# Patient Record
Sex: Female | Born: 1935 | Race: Black or African American | Hispanic: No | Smoking: Never smoker
Health system: Southern US, Community
[De-identification: ages and names within clinical notes are randomized; demographics above are authoritative.]

## PROBLEM LIST (undated history)

## (undated) DIAGNOSIS — M1990 Unspecified osteoarthritis, unspecified site: Secondary | ICD-10-CM

## (undated) DIAGNOSIS — E785 Hyperlipidemia, unspecified: Secondary | ICD-10-CM

## (undated) DIAGNOSIS — I2510 Atherosclerotic heart disease of native coronary artery without angina pectoris: Secondary | ICD-10-CM

## (undated) DIAGNOSIS — I252 Old myocardial infarction: Secondary | ICD-10-CM

## (undated) DIAGNOSIS — D649 Anemia, unspecified: Secondary | ICD-10-CM

## (undated) DIAGNOSIS — N181 Chronic kidney disease, stage 1: Secondary | ICD-10-CM

## (undated) DIAGNOSIS — R209 Unspecified disturbances of skin sensation: Secondary | ICD-10-CM

## (undated) DIAGNOSIS — K219 Gastro-esophageal reflux disease without esophagitis: Secondary | ICD-10-CM

## (undated) DIAGNOSIS — I10 Essential (primary) hypertension: Secondary | ICD-10-CM

## (undated) DIAGNOSIS — K222 Esophageal obstruction: Secondary | ICD-10-CM

## (undated) HISTORY — DX: Unspecified disturbances of skin sensation: R20.9

## (undated) HISTORY — DX: Esophageal obstruction: K22.2

## (undated) HISTORY — DX: Anemia, unspecified: D64.9

## (undated) HISTORY — DX: Atherosclerotic heart disease of native coronary artery without angina pectoris: I25.10

## (undated) HISTORY — PX: ABDOMINAL HYSTERECTOMY: SHX81

## (undated) HISTORY — DX: Old myocardial infarction: I25.2

## (undated) HISTORY — PX: REVISION TOTAL KNEE ARTHROPLASTY: SHX767

## (undated) HISTORY — DX: Unspecified osteoarthritis, unspecified site: M19.90

## (undated) HISTORY — DX: Hyperlipidemia, unspecified: E78.5

## (undated) HISTORY — DX: Chronic kidney disease, stage 1: N18.1

## (undated) HISTORY — PX: TONSILLECTOMY: SUR1361

## (undated) HISTORY — DX: Essential (primary) hypertension: I10

## (undated) HISTORY — DX: Gastro-esophageal reflux disease without esophagitis: K21.9

---

## 1998-03-08 ENCOUNTER — Ambulatory Visit (HOSPITAL_COMMUNITY): Admission: RE | Admit: 1998-03-08 | Discharge: 1998-03-08 | Payer: Self-pay | Admitting: Gastroenterology

## 1999-05-10 ENCOUNTER — Encounter: Admission: RE | Admit: 1999-05-10 | Discharge: 1999-05-10 | Payer: Self-pay | Admitting: Internal Medicine

## 1999-05-10 ENCOUNTER — Encounter: Payer: Self-pay | Admitting: Internal Medicine

## 1999-07-05 ENCOUNTER — Other Ambulatory Visit: Admission: RE | Admit: 1999-07-05 | Discharge: 1999-07-05 | Payer: Self-pay | Admitting: Internal Medicine

## 2000-09-22 ENCOUNTER — Encounter: Payer: Self-pay | Admitting: Ophthalmology

## 2000-09-25 ENCOUNTER — Ambulatory Visit (HOSPITAL_COMMUNITY): Admission: RE | Admit: 2000-09-25 | Discharge: 2000-09-25 | Payer: Self-pay | Admitting: Ophthalmology

## 2001-05-13 ENCOUNTER — Encounter: Payer: Self-pay | Admitting: Gastroenterology

## 2001-05-13 ENCOUNTER — Ambulatory Visit (HOSPITAL_COMMUNITY): Admission: RE | Admit: 2001-05-13 | Discharge: 2001-05-13 | Payer: Self-pay | Admitting: Gastroenterology

## 2001-06-02 ENCOUNTER — Encounter: Admission: RE | Admit: 2001-06-02 | Discharge: 2001-06-02 | Payer: Self-pay | Admitting: Internal Medicine

## 2001-06-02 ENCOUNTER — Encounter: Payer: Self-pay | Admitting: Internal Medicine

## 2001-09-04 ENCOUNTER — Inpatient Hospital Stay (HOSPITAL_COMMUNITY): Admission: RE | Admit: 2001-09-04 | Discharge: 2001-09-07 | Payer: Self-pay | Admitting: Cardiology

## 2001-09-22 ENCOUNTER — Encounter: Payer: Self-pay | Admitting: Emergency Medicine

## 2001-09-22 ENCOUNTER — Emergency Department (HOSPITAL_COMMUNITY): Admission: EM | Admit: 2001-09-22 | Discharge: 2001-09-22 | Payer: Self-pay | Admitting: Emergency Medicine

## 2002-07-28 ENCOUNTER — Encounter: Payer: Self-pay | Admitting: Internal Medicine

## 2002-07-28 ENCOUNTER — Encounter: Admission: RE | Admit: 2002-07-28 | Discharge: 2002-07-28 | Payer: Self-pay | Admitting: Internal Medicine

## 2003-09-06 ENCOUNTER — Encounter: Admission: RE | Admit: 2003-09-06 | Discharge: 2003-09-06 | Payer: Self-pay | Admitting: Internal Medicine

## 2005-12-20 ENCOUNTER — Ambulatory Visit (HOSPITAL_COMMUNITY): Admission: RE | Admit: 2005-12-20 | Discharge: 2005-12-20 | Payer: Self-pay | Admitting: Internal Medicine

## 2010-02-19 ENCOUNTER — Emergency Department (HOSPITAL_COMMUNITY): Admission: EM | Admit: 2010-02-19 | Discharge: 2010-02-19 | Payer: Self-pay | Admitting: Family Medicine

## 2010-03-01 ENCOUNTER — Ambulatory Visit: Payer: Self-pay | Admitting: Internal Medicine

## 2010-03-01 DIAGNOSIS — I2510 Atherosclerotic heart disease of native coronary artery without angina pectoris: Secondary | ICD-10-CM

## 2010-03-01 DIAGNOSIS — E785 Hyperlipidemia, unspecified: Secondary | ICD-10-CM

## 2010-03-01 DIAGNOSIS — R209 Unspecified disturbances of skin sensation: Secondary | ICD-10-CM

## 2010-03-01 DIAGNOSIS — M1990 Unspecified osteoarthritis, unspecified site: Secondary | ICD-10-CM | POA: Insufficient documentation

## 2010-03-01 DIAGNOSIS — I10 Essential (primary) hypertension: Secondary | ICD-10-CM | POA: Insufficient documentation

## 2010-03-01 DIAGNOSIS — I252 Old myocardial infarction: Secondary | ICD-10-CM | POA: Insufficient documentation

## 2010-03-01 LAB — CONVERTED CEMR LAB
ALT: 12 units/L (ref 0–35)
Albumin: 3.5 g/dL (ref 3.5–5.2)
BUN: 23 mg/dL (ref 6–23)
Basophils Relative: 0.3 % (ref 0.0–3.0)
Calcium: 9.1 mg/dL (ref 8.4–10.5)
Cholesterol: 140 mg/dL (ref 0–200)
Eosinophils Absolute: 0.2 10*3/uL (ref 0.0–0.7)
Eosinophils Relative: 1.5 % (ref 0.0–5.0)
GFR calc non Af Amer: 46.54 mL/min (ref >60)
HCT: 32.1 % — ABNORMAL LOW (ref 36.0–46.0)
HDL goal, serum: 40 mg/dL
Hemoglobin: 10.7 g/dL — ABNORMAL LOW (ref 12.0–15.0)
LDL Goal: 100 mg/dL
Lymphs Abs: 1.9 10*3/uL (ref 0.7–4.0)
MCHC: 33.4 g/dL (ref 30.0–36.0)
MCV: 93.8 fL (ref 78.0–100.0)
Monocytes Absolute: 1.9 10*3/uL — ABNORMAL HIGH (ref 0.1–1.0)
Neutro Abs: 11.6 10*3/uL — ABNORMAL HIGH (ref 1.4–7.7)
Neutrophils Relative %: 74.1 % (ref 43.0–77.0)
Potassium: 4.1 meq/L (ref 3.5–5.1)
RBC: 3.42 M/uL — ABNORMAL LOW (ref 3.87–5.11)
Sed Rate: 61 mm/hr — ABNORMAL HIGH (ref 0–22)
Sodium: 138 meq/L (ref 135–145)
TSH: 2.07 microintl units/mL (ref 0.35–5.50)
Total Protein: 7 g/dL (ref 6.0–8.3)
VLDL: 14.6 mg/dL (ref 0.0–40.0)
Vitamin B-12: 294 pg/mL (ref 211–911)
WBC: 15.6 10*3/uL — ABNORMAL HIGH (ref 4.5–10.5)

## 2010-03-02 ENCOUNTER — Encounter: Payer: Self-pay | Admitting: Internal Medicine

## 2010-03-15 ENCOUNTER — Ambulatory Visit: Payer: Self-pay | Admitting: Internal Medicine

## 2010-03-15 DIAGNOSIS — D72829 Elevated white blood cell count, unspecified: Secondary | ICD-10-CM | POA: Insufficient documentation

## 2010-03-15 DIAGNOSIS — N181 Chronic kidney disease, stage 1: Secondary | ICD-10-CM

## 2010-03-15 LAB — CONVERTED CEMR LAB
Alpha-1-Globulin: 7.3 % — ABNORMAL HIGH (ref 2.9–4.9)
Basophils Absolute: 0 10*3/uL (ref 0.0–0.1)
Basophils Relative: 0.3 % (ref 0.0–3.0)
CO2: 26 meq/L (ref 19–32)
Calcium: 9.5 mg/dL (ref 8.4–10.5)
Creatinine, Ser: 1.2 mg/dL (ref 0.4–1.2)
Eosinophils Absolute: 0.3 10*3/uL (ref 0.0–0.7)
GFR calc non Af Amer: 54.1 mL/min (ref >60)
Hemoglobin: 11 g/dL — ABNORMAL LOW (ref 12.0–15.0)
Lymphocytes Relative: 16.6 % (ref 12.0–46.0)
MCHC: 33.8 g/dL (ref 30.0–36.0)
Monocytes Relative: 9.7 % (ref 3.0–12.0)
Neutro Abs: 7.8 10*3/uL — ABNORMAL HIGH (ref 1.4–7.7)
Neutrophils Relative %: 70.3 % (ref 43.0–77.0)
RBC: 3.48 M/uL — ABNORMAL LOW (ref 3.87–5.11)
Saturation Ratios: 19.1 % — ABNORMAL LOW (ref 20.0–50.0)
Sodium: 141 meq/L (ref 135–145)
Total Protein, Serum Electrophoresis: 7.7 g/dL (ref 6.0–8.3)
Transferrin: 246.7 mg/dL (ref 212.0–360.0)

## 2010-05-02 ENCOUNTER — Telehealth: Payer: Self-pay | Admitting: Internal Medicine

## 2010-06-12 ENCOUNTER — Ambulatory Visit: Payer: Self-pay | Admitting: Internal Medicine

## 2010-06-12 DIAGNOSIS — K219 Gastro-esophageal reflux disease without esophagitis: Secondary | ICD-10-CM | POA: Insufficient documentation

## 2010-06-14 ENCOUNTER — Encounter (INDEPENDENT_AMBULATORY_CARE_PROVIDER_SITE_OTHER): Payer: Self-pay | Admitting: *Deleted

## 2010-06-20 ENCOUNTER — Encounter (INDEPENDENT_AMBULATORY_CARE_PROVIDER_SITE_OTHER): Payer: Self-pay | Admitting: *Deleted

## 2010-06-20 ENCOUNTER — Ambulatory Visit: Payer: Self-pay | Admitting: Internal Medicine

## 2010-06-27 ENCOUNTER — Encounter: Payer: Self-pay | Admitting: Internal Medicine

## 2010-06-27 ENCOUNTER — Telehealth: Payer: Self-pay | Admitting: Internal Medicine

## 2010-06-27 ENCOUNTER — Ambulatory Visit (HOSPITAL_COMMUNITY)
Admission: RE | Admit: 2010-06-27 | Discharge: 2010-06-27 | Payer: Self-pay | Source: Home / Self Care | Admitting: Internal Medicine

## 2010-06-27 DIAGNOSIS — K222 Esophageal obstruction: Secondary | ICD-10-CM | POA: Insufficient documentation

## 2010-06-28 ENCOUNTER — Telehealth: Payer: Self-pay | Admitting: Internal Medicine

## 2010-07-04 ENCOUNTER — Ambulatory Visit (HOSPITAL_COMMUNITY)
Admission: RE | Admit: 2010-07-04 | Discharge: 2010-07-04 | Payer: Self-pay | Source: Home / Self Care | Admitting: Internal Medicine

## 2010-07-12 ENCOUNTER — Telehealth: Payer: Self-pay | Admitting: Internal Medicine

## 2010-07-13 ENCOUNTER — Encounter (INDEPENDENT_AMBULATORY_CARE_PROVIDER_SITE_OTHER): Payer: Self-pay | Admitting: *Deleted

## 2010-07-25 ENCOUNTER — Ambulatory Visit (HOSPITAL_COMMUNITY)
Admission: RE | Admit: 2010-07-25 | Discharge: 2010-07-25 | Payer: Self-pay | Source: Home / Self Care | Attending: Internal Medicine | Admitting: Internal Medicine

## 2010-07-25 ENCOUNTER — Encounter: Payer: Self-pay | Admitting: Internal Medicine

## 2010-08-14 NOTE — Letter (Signed)
Summary: New Patient letter  [facility]  [address] [place], [place] [address]   Phone: [phone]  Fax: [phone]       06/14/2010 MRN: [id number]  Natalie Goodman [address] [place], [place]  [address]  Dear [name],  Welcome to the Gastroenterology Division at [facility].    You are scheduled to see Dr.  Leone Payor on 06-20-10 at 2:45p.m. on the 3rd floor at [facility], [address]  We ask that you try to arrive at our office 15 minutes prior to your appointment time to allow for check-in.  We would like you to complete the enclosed self-administered evaluation form prior to your visit and bring it with you on the day of your appointment.  We will review it with you.  Also, please bring a complete list of all your medications or, if you prefer, bring the medication bottles and we will list them.  Please bring your insurance card so that we may make a copy of it.  If your insurance requires a referral to see a specialist, please bring your referral form from your primary care physician.  Co-payments are due at the time of your visit and may be paid by cash, check or credit card.     Your office visit will consist of a consult with your physician (includes a physical exam), any laboratory testing he/she may order, scheduling of any necessary diagnostic testing (e.g. x-ray, ultrasound, CT-scan), and scheduling of a procedure (e.g. Endoscopy, Colonoscopy) if required.  Please allow enough time on your schedule to allow for any/all of these possibilities.    If you cannot keep your appointment, please call [phone] to cancel or reschedule prior to your appointment date.  This allows [place] the opportunity to schedule an appointment for another patient in need of care.  If you do not cancel or reschedule by 5 p.m. the business day prior to your appointment date, you will be charged a $50.00 late cancellation/no-show fee.    Thank you for choosing   Gastroenterology for your medical needs.  We appreciate the opportunity to care for you.  Please visit [place] at our website  to learn more about our practice.                     Sincerely,                                                             The Gastroenterology Division

## 2010-08-14 NOTE — Letter (Signed)
Summary: Results Follow-up Letter  [facility]  [address] [place], [place] [address]   Phone: [phone]  Fax: [phone]    03/02/2010  [address] [place], [place]  [address]  Dear Ms. Bellantoni,   The following are the results of your recent test(s):  Test     Result     CBC       WBC is slightly elevated and you are anemic Liver       normal Kidney     mild dysfunction B12 level     normal Thyroid     normal  _________________________________________________________  Please call for an appointment in 2-3 weeks _________________________________________________________ _________________________________________________________ _________________________________________________________  Sincerely,  Sanda Linger MD [facility]

## 2010-08-14 NOTE — Progress Notes (Signed)
Summary: pt refuse appt   Phone Note From Other Clinic   Caller: Appointment Secretary- sara [phone] [facility]  Call For: Dr Yetta Barre Summary of Call: [facility] sara  called pt to set up appt  for a screening  mamogram was  told by patient that she did not want to have any more mammograms . sara from [facility] wanted to inform Dr Yetta Barre of this and wanted to know what to do about her referral pls advise .Marland Kitchenreferral was done in august  Initial call taken by: Shelbie Proctor,  May 02, 2010 8:27 AM

## 2010-08-14 NOTE — Assessment & Plan Note (Signed)
Summary: TROUBLE SWALLOWING FOOD--VOMIT--STC   Vital Signs:  Patient profile:   77 year old female Menstrual status:  postmenopausal Height:      55 inches Weight:      235.50 pounds BMI:     54.93 O2 Sat:      97 % on Room air Temp:     98.7 degrees F oral Pulse rate:   83 / minute Pulse rhythm:   regular Resp:     16 per minute BP sitting:   130 / 64  (left arm) Cuff size:   small  Vitals Entered By: Rock Nephew CMA (June 12, 2010 11:11 AM)  O2 Flow:  Room air CC: Patient c/o of swallowing difficulty and vomiting when eating Is Patient Diabetic? No Pain Assessment Patient in pain? no       Does patient need assistance? Functional Status Self care Ambulation Normal     Menstrual Status postmenopausal   Primary Care Dangela How:  Etta Grandchild MD  CC:  Patient c/o of swallowing difficulty and vomiting when eating.  History of Present Illness: She returns c/o difficulty swallowing liquids and solids for 2 months. She says she saw Dr. Jarold Motto years ago for same and describes getting an EGD with dilation done.  Preventive Screening-Counseling & Management  Alcohol-Tobacco     Alcohol drinks/day: 0     Alcohol Counseling: not indicated; patient does not drink     Smoking Status: never     Tobacco Counseling: not indicated; no tobacco use  Hep-HIV-STD-Contraception     Hepatitis Risk: no risk noted     HIV Risk: no risk noted     STD Risk: no risk noted     Dental Visit-last 6 months yes     SBE monthly: yes     SBE Education/Counseling: to perform regular SBE  Clinical Review Panels:  Prevention   Last Colonoscopy:  normal (07/15/1998)  Immunizations   Last Tetanus Booster:  Td (06/12/2010)   Last Flu Vaccine:  Fluvax 3+ (06/12/2010)  Lipid Management   Cholesterol:  140 (03/01/2010)   LDL (bad choesterol):  78 (03/01/2010)   HDL (good cholesterol):  47.10 (03/01/2010)  Diabetes Management   Creatinine:  1.2 (03/15/2010)   Last Flu  Vaccine:  Fluvax 3+ (06/12/2010)  CBC   WBC:  11.1 (03/15/2010)   RBC:  3.48 (03/15/2010)   Hgb:  11.0 (03/15/2010)   Hct:  32.5 (03/15/2010)   Platelets:  249.0 (03/15/2010)   MCV  93.4 (03/15/2010)   MCHC  33.8 (03/15/2010)   RDW  13.5 (03/15/2010)   PMN:  70.3 (03/15/2010)   Lymphs:  16.6 (03/15/2010)   Monos:  9.7 (03/15/2010)   Eosinophils:  3.1 (03/15/2010)   Basophil:  0.3 (03/15/2010)  Complete Metabolic Panel   Glucose:  100 (03/15/2010)   Sodium:  141 (03/15/2010)   Potassium:  4.5 (03/15/2010)   Chloride:  106 (03/15/2010)   CO2:  26 (03/15/2010)   BUN:  25 (03/15/2010)   Creatinine:  1.2 (03/15/2010)   Albumin:  3.5 (03/01/2010)   Total Protein:  7.0 (03/01/2010)   Calcium:  9.5 (03/15/2010)   Total Bili:  2.0 (03/01/2010)   Alk Phos:  81 (03/01/2010)   SGPT (ALT):  12 (03/01/2010)   SGOT (AST):  18 (03/01/2010)   Medications Prior to Update: 1)  Furosemide 20 Mg Tabs (Furosemide) .... Take 1 Tablet By Mouth Once A Day 2)  Diovan 160 Mg Tabs (Valsartan) .... Take 1 Tablet By Mouth Once  A Day 3)  Metoprolol Tartrate 50 Mg Tabs (Metoprolol Tartrate) .... Take 1 Tablet By Mouth Two Times A Day 4)  Crestor 5 Mg Tabs (Rosuvastatin Calcium) .... Take 1 Tablet By Mouth Once A Day 5)  Bayer Aspirin 325 Mg Tabs (Aspirin) .... Take 1 Tablet By Mouth Once A Day  Current Medications (verified): 1)  Furosemide 20 Mg Tabs (Furosemide) .... Take 1 Tablet By Mouth Once A Day 2)  Diovan 160 Mg Tabs (Valsartan) .... Take 1 Tablet By Mouth Once A Day 3)  Metoprolol Tartrate 50 Mg Tabs (Metoprolol Tartrate) .... Take 1 Tablet By Mouth Two Times A Day 4)  Crestor 5 Mg Tabs (Rosuvastatin Calcium) .... Take 1 Tablet By Mouth Once A Day 5)  Bayer Aspirin 325 Mg Tabs (Aspirin) .... Take 1 Tablet By Mouth Once A Day 6)  Omeprazole 40 Mg Cpdr (Omeprazole) .... One By Mouth Once Daily For Heartburn  Allergies (verified): 1)  ! Codeine  Past History:  Past Medical History: Last  updated: 03/01/2010 Coronary artery disease Hyperlipidemia Hypertension Myocardial infarction, hx of Osteoarthritis  Past Surgical History: Last updated: 03/01/2010 Hysterectomy Total knee replacement Tonsillectomy  Family History: Last updated: 03/01/2010 Family History of Arthritis  Social History: Last updated: 03/01/2010 Retired Widow/Widower Never Smoked Alcohol use-no Drug use-no Regular exercise-no  Risk Factors: Alcohol Use: 0 (06/12/2010) Exercise: no (03/01/2010)  Risk Factors: Smoking Status: never (06/12/2010)  Family History: Reviewed history from 03/01/2010 and no changes required. Family History of Arthritis  Social History: Reviewed history from 03/01/2010 and no changes required. Retired Conservation officer, nature Never Smoked Alcohol use-no Drug use-no Regular exercise-no  Review of Systems  The patient denies anorexia, fever, weight loss, weight gain, hoarseness, chest pain, syncope, peripheral edema, prolonged cough, headaches, hemoptysis, abdominal pain, melena, hematochezia, severe indigestion/heartburn, and hematuria.   GI:  Denies abdominal pain, bloody stools, change in bowel habits, diarrhea, gas, indigestion, loss of appetite, nausea, vomiting, vomiting blood, and yellowish skin color.  Physical Exam  General:  alert, well-developed, well-nourished, well-hydrated, appropriate dress, normal appearance, healthy-appearing, cooperative to examination, and good hygiene.   Mouth:  edentulous.   Neck:  supple, full ROM, no masses, no thyromegaly, no JVD, normal carotid upstroke, no carotid bruits, and no cervical lymphadenopathy.   Lungs:  normal respiratory effort, no intercostal retractions, no accessory muscle use, normal breath sounds, no dullness, no fremitus, no crackles, and no wheezes.   Heart:  normal rate, regular rhythm, no murmur, no gallop, no rub, and no JVD.   Abdomen:  soft, non-tender, normal bowel sounds, no distention, no masses, no  guarding, no rigidity, no rebound tenderness, no abdominal hernia, no inguinal hernia, no hepatomegaly, and no splenomegaly.   Msk:  normal ROM, no joint tenderness, no joint swelling, no joint warmth, no redness over joints, no joint deformities, no joint instability, and no crepitation.   Extremities:  No clubbing, cyanosis, edema, or deformity noted with normal full range of motion of all joints.   Neurologic:  alert & oriented X3, cranial nerves II-XII intact, strength normal in all extremities, sensation intact to light touch, gait normal, toes down bilaterally on Babinski, Romberg negative, RUE hyporeflexia, RLE hyporeflexia, LUE hyporeflexia, and LLE hyporeflexia.   Skin:  turgor normal, color normal, no rashes, no suspicious lesions, no ecchymoses, no petechiae, no purpura, no ulcerations, and no edema.   Cervical Nodes:  no anterior cervical adenopathy and no posterior cervical adenopathy.   Psych:  Cognition and judgment appear intact. Alert and cooperative with  normal attention span and concentration. No apparent delusions, illusions, hallucinations   Impression & Recommendations:  Problem # 1:  DYSPHAGIA PHARYNGOESOPHAGEAL PHASE (EAV-409.81) Assessment New  Orders: Gastroenterology Referral (GI)  Problem # 2:  GERD (ICD-530.81) Assessment: New  Her updated medication list for this problem includes:    Omeprazole 40 Mg Cpdr (Omeprazole) ..... One by mouth once daily for heartburn  Labs Reviewed: Hgb: 11.0 (03/15/2010)   Hct: 32.5 (03/15/2010)  Complete Medication List: 1)  Furosemide 20 Mg Tabs (Furosemide) .... Take 1 tablet by mouth once a day 2)  Diovan 160 Mg Tabs (Valsartan) .... Take 1 tablet by mouth once a day 3)  Metoprolol Tartrate 50 Mg Tabs (Metoprolol tartrate) .... Take 1 tablet by mouth two times a day 4)  Crestor 5 Mg Tabs (Rosuvastatin calcium) .... Take 1 tablet by mouth once a day 5)  Bayer Aspirin 325 Mg Tabs (Aspirin) .... Take 1 tablet by mouth once a  day 6)  Omeprazole 40 Mg Cpdr (Omeprazole) .... One by mouth once daily for heartburn  Other Orders: Flu Vaccine 28yrs + MEDICARE PATIENTS (X9147) Administration Flu vaccine - MCR (G0008) TD Toxoids IM 7 YR + (82956) Admin 1st Vaccine (21308)  Patient Instructions: 1)  Please schedule a follow-up appointment in 1 month. 2)  Avoid foods high in acid (tomatoes, citrus juices, spicy foods). Avoid eating within two hours of lying down or before exercising. Do not over eat; try smaller more frequent meals. Elevate head of bed twelve inches when sleeping. Prescriptions: OMEPRAZOLE 40 MG CPDR (OMEPRAZOLE) one by mouth once daily for heartburn  #30 x 11   Entered and Authorized by:   Etta Grandchild MD   Signed by:   Etta Grandchild MD on 06/12/2010   Method used:   Electronically to        [facility] [id number]* (retail)       [address]       [place], [place]  [address]       Ph: [phone]       Fax: [phone]   RxID:   [id number]    Orders Added: 1)  Gastroenterology Referral [GI] 2)  Flu Vaccine 60yrs + MEDICARE PATIENTS [Q2039] 3)  Administration Flu vaccine - MCR [G0008] 4)  TD Toxoids IM 7 YR + [90714] 5)  Admin 1st Vaccine [90471] 6)  Est. Patient Level IV [64332]   Immunizations Administered:  Tetanus Vaccine:    Vaccine Type: Td    Site: right deltoid    Mfr: Sanofi Pasteur    Dose: 0.5 ml    Route: IM    Given by: Rock Nephew CMA    Exp. Date: 08/16/2011    Lot #: [id number]    VIS given: 06/01/08 version given June 12, 2010.   Immunizations Administered:  Tetanus Vaccine:    Vaccine Type: Td    Site: right deltoid    Mfr: Sanofi Pasteur    Dose: 0.5 ml    Route: IM    Given by: Rock Nephew CMA    Exp. Date: 08/16/2011    Lot #: [id number]    VIS given: 06/01/08 version given June 12, 2010. Marland [email or web address] Flu Vaccine Consent Questions     Do you have a history of severe allergic reactions to this vaccine? no    Any prior history of  allergic reactions to egg and/or gelatin? no    Do you have a sensitivity to the preservative Thimersol? no  Do you have a past history of Guillan-Barre Syndrome? no    Do you currently have an acute febrile illness? no    Have you ever had a severe reaction to latex? no    Vaccine information given and explained to patient? yes    Are you currently pregnant? no    Lot Number:[id number]   Exp Date:01/12/2011   Site Given  Left Deltoid IM

## 2010-08-14 NOTE — Letter (Signed)
Summary: EGD Instructions  [facility]  [address] [place], [place] [address]   Phone: [phone]  Fax: [phone]       Javen Muntean    Oct 31, 1924    MRN: [id number]       Procedure Day Dorna BloomLulu Riding, 06/27/10     Arrival Time: 12:00 PM      Procedure Time: 1:15 PM    Location of Procedure:                    _X_ [facility] ( Outpatient Registration)   PREPARATION FOR ENDOSCOPY   On [facility], 06/27/10,  THE DAY OF THE PROCEDURE:  1.   No solid foods, milk or milk products are allowed after midnight the night before your procedure.  2.   Do not drink anything colored red or purple.  Avoid juices with pulp.  No orange juice.  3.  You may drink clear liquids until 9:15 AM, which is 4 hours before your procedure.                                                                                                CLEAR LIQUIDS INCLUDE: Water Jello Ice Popsicles Tea (sugar ok, no milk/cream) Powdered fruit flavored drinks Coffee (sugar ok, no milk/cream) Gatorade Juice: apple, white grape, white cranberry  Lemonade Clear bullion, consomm, broth Carbonated beverages (any kind) Strained chicken noodle soup Hard Candy   MEDICATION INSTRUCTIONS  Unless otherwise instructed, you should take regular prescription medications with a small sip of water as early as possible the morning of your procedure.                  OTHER INSTRUCTIONS  You will need a responsible adult at least 75 years of age to accompany you and drive you home.   This person must remain in the waiting room during your procedure.  Wear loose fitting clothing that is easily removed.  Leave jewelry and other valuables at home.  However, you may wish to bring a book to read or an iPod/MP3 player to listen to music as you wait for your procedure to start.  Remove all body piercing jewelry and leave at home.  Total time from sign-in until discharge is approximately 2-3  hours.  You should go home directly after your procedure and rest.  You can resume normal activities the day after your procedure.  The day of your procedure you should not:   Drive   Make legal decisions   Operate machinery   Drink alcohol   Return to work  You will receive specific instructions about eating, activities and medications before you leave.    The above instructions have been reviewed and explained to me by   _______________________    I fully understand and can verbalize these instructions _____________________________ Date _________

## 2010-08-14 NOTE — Letter (Signed)
Summary: Lipid Letter  [facility]  [address] [place], [place] [address]   Phone: [phone]  Fax: [phone]    03/02/2010  [facility] [address] [place], [place]  [address]  Dear Ms. Cuthbert:  We have carefully reviewed your last lipid profile from 03/01/2010 and the results are noted below with a summary of recommendations for lipid management.    Cholesterol:       140     Goal: <200   HDL "good" Cholesterol:   57.84     Goal: >40   LDL "bad" Cholesterol:   78     Goal: <100   Triglycerides:       73.0     Goal: <150        TLC Diet (Therapeutic Lifestyle Change): Saturated Fats & Transfatty acids should be kept < 7% of total calories ***Reduce Saturated Fats Polyunstaurated Fat can be up to 10% of total calories Monounsaturated Fat Fat can be up to 20% of total calories Total Fat should be no greater than 25-35% of total calories Carbohydrates should be 50-60% of total calories Protein should be approximately 15% of total calories Fiber should be at least 20-30 grams a day ***Increased fiber may help lower LDL Total Cholesterol should be < 200mg /day Consider adding plant stanol/sterols to diet (example: Benacol spread) ***A higher intake of unsaturated fat may reduce Triglycerides and Increase HDL    Adjunctive Measures (may lower LIPIDS and reduce risk of Heart Attack) include: Aerobic Exercise (20-30 minutes 3-4 times a week) Limit Alcohol Consumption Weight Reduction Aspirin 75-81 mg a day by mouth (if not allergic or contraindicated) Dietary Fiber 20-30 grams a day by mouth     Current Medications: 1)    Furosemide 20 Mg Tabs (Furosemide) .... Take 1 tablet by mouth once a day 2)    Diovan 160 Mg Tabs (Valsartan) .... Take 1 tablet by mouth once a day 3)    Metoprolol Tartrate 50 Mg Tabs (Metoprolol tartrate) .... Take 1 tablet by mouth two times a day 4)    Crestor 5 Mg Tabs (Rosuvastatin calcium) .... Take 1 tablet by mouth once a day 5)     Bayer Aspirin 325 Mg Tabs (Aspirin) .... Take 1 tablet by mouth once a day  If you have any questions, please call. We appreciate being able to work with you.   Sincerely,    [facility] Etta Grandchild MD

## 2010-08-14 NOTE — Assessment & Plan Note (Signed)
Summary: 2 WK ROV /NWS  #   Vital Signs:  Patient profile:   77 year old female Height:      55 inches Weight:      227.25 pounds BMI:     53.01 O2 Sat:      96 % on Room air Temp:     97.9 degrees F oral Pulse rate:   60 / minute Pulse rhythm:   regular Resp:     16 per minute BP sitting:   120 / 64  (left arm) Cuff size:   large  Vitals Entered By: Rock Nephew CMA (March 15, 2010 1:54 PM)  Nutrition Counseling: Patient's BMI is greater than 25 and therefore counseled on weight management options.  O2 Flow:  Room air CC: follow-up visit// lab results   Primary Care Provider:  Etta Grandchild MD  CC:  follow-up visit// lab results.  History of Present Illness: She returns for f/up and to discuss labs- the only noteables on her labs were a slight increase in WBC and Creatinine. She feels well today and says that her paresthesias have resolved.  Preventive Screening-Counseling & Management  Alcohol-Tobacco     Alcohol drinks/day: 0     Smoking Status: never     Tobacco Counseling: not indicated; no tobacco use  Hep-HIV-STD-Contraception     Hepatitis Risk: no risk noted     HIV Risk: no risk noted     STD Risk: no risk noted     Dental Visit-last 6 months yes     SBE monthly: yes     SBE Education/Counseling: to perform regular SBE      Sexual History:  not active.        Drug Use:  no.        Blood Transfusions:  no.    Clinical Review Panels:  Lipid Management   Cholesterol:  140 (03/01/2010)   LDL (bad choesterol):  78 (03/01/2010)   HDL (good cholesterol):  47.10 (03/01/2010)  Diabetes Management   Creatinine:  1.4 (03/01/2010)  CBC   WBC:  15.6 (03/01/2010)   RBC:  3.42 (03/01/2010)   Hgb:  10.7 (03/01/2010)   Hct:  32.1 (03/01/2010)   Platelets:  207.0 (03/01/2010)   MCV  93.8 (03/01/2010)   MCHC  33.4 (03/01/2010)   RDW  13.4 (03/01/2010)   PMN:  74.1 (03/01/2010)   Lymphs:  12.0 (03/01/2010)   Monos:  12.1 (03/01/2010)  Eosinophils:  1.5 (03/01/2010)   Basophil:  0.3 (03/01/2010)  Complete Metabolic Panel   Glucose:  96 (03/01/2010)   Sodium:  138 (03/01/2010)   Potassium:  4.1 (03/01/2010)   Chloride:  105 (03/01/2010)   CO2:  26 (03/01/2010)   BUN:  23 (03/01/2010)   Creatinine:  1.4 (03/01/2010)   Albumin:  3.5 (03/01/2010)   Total Protein:  7.0 (03/01/2010)   Calcium:  9.1 (03/01/2010)   Total Bili:  2.0 (03/01/2010)   Alk Phos:  81 (03/01/2010)   SGPT (ALT):  12 (03/01/2010)   SGOT (AST):  18 (03/01/2010)   Medications Prior to Update: 1)  Furosemide 20 Mg Tabs (Furosemide) .... Take 1 Tablet By Mouth Once A Day 2)  Diovan 160 Mg Tabs (Valsartan) .... Take 1 Tablet By Mouth Once A Day 3)  Metoprolol Tartrate 50 Mg Tabs (Metoprolol Tartrate) .... Take 1 Tablet By Mouth Two Times A Day 4)  Crestor 5 Mg Tabs (Rosuvastatin Calcium) .... Take 1 Tablet By Mouth Once A Day 5)  Bayer Aspirin 325 Mg Tabs (Aspirin) .... Take 1 Tablet By Mouth Once A Day  Current Medications (verified): 1)  Furosemide 20 Mg Tabs (Furosemide) .... Take 1 Tablet By Mouth Once A Day 2)  Diovan 160 Mg Tabs (Valsartan) .... Take 1 Tablet By Mouth Once A Day 3)  Metoprolol Tartrate 50 Mg Tabs (Metoprolol Tartrate) .... Take 1 Tablet By Mouth Two Times A Day 4)  Crestor 5 Mg Tabs (Rosuvastatin Calcium) .... Take 1 Tablet By Mouth Once A Day 5)  Bayer Aspirin 325 Mg Tabs (Aspirin) .... Take 1 Tablet By Mouth Once A Day  Allergies (verified): 1)  ! Codeine  Past History:  Past Medical History: Last updated: 03/01/2010 Coronary artery disease Hyperlipidemia Hypertension Myocardial infarction, hx of Osteoarthritis  Past Surgical History: Last updated: 03/01/2010 Hysterectomy Total knee replacement Tonsillectomy  Family History: Last updated: 03/01/2010 Family History of Arthritis  Social History: Last updated: 03/01/2010 Retired Widow/Widower Never Smoked Alcohol use-no Drug use-no Regular  exercise-no  Risk Factors: Alcohol Use: 0 (03/15/2010) Exercise: no (03/01/2010)  Risk Factors: Smoking Status: never (03/15/2010)  Family History: Reviewed history from 03/01/2010 and no changes required. Family History of Arthritis  Social History: Reviewed history from 03/01/2010 and no changes required. 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  The patient denies anorexia, fever, weight loss, chest pain, syncope, dyspnea on exertion, peripheral edema, prolonged cough, headaches, hemoptysis, abdominal pain, hematuria, suspicious skin lesions, transient blindness, enlarged lymph nodes, angioedema, and breast masses.   GU:  Denies decreased libido, discharge, dysuria, hematuria, incontinence, nocturia, urinary frequency, and urinary hesitancy. Neuro:  Denies brief paralysis, difficulty with concentration, disturbances in coordination, headaches, inability to speak, memory loss, numbness, poor balance, seizures, sensation of room spinning, tingling, tremors, visual disturbances, and weakness. Heme:  Denies abnormal bruising, bleeding, enlarge lymph nodes, fevers, pallor, and skin discoloration.  Physical Exam  General:  alert, well-developed, well-nourished, well-hydrated, appropriate dress, normal appearance, healthy-appearing, cooperative to examination, and good hygiene.   Head:  normocephalic, atraumatic, no abnormalities observed, and no abnormalities palpated.   Eyes:  vision grossly intact, pupils equal, pupils round, and pupils reactive to light.   Mouth:  edentulous.   Neck:  supple, full ROM, no masses, no thyromegaly, no JVD, normal carotid upstroke, no carotid bruits, and no cervical lymphadenopathy.   Lungs:  normal respiratory effort, no intercostal retractions, no accessory muscle use, normal breath sounds, no dullness, no fremitus, no crackles, and no wheezes.    Heart:  normal rate, regular rhythm, no murmur, no gallop, no rub, and no JVD.   Abdomen:  soft, non-tender, normal bowel sounds, no distention, no masses, no guarding, no rigidity, no rebound tenderness, no abdominal hernia, no inguinal hernia, no hepatomegaly, and no splenomegaly.   Msk:  normal ROM, no joint tenderness, no joint swelling, no joint warmth, no redness over joints, no joint deformities, no joint instability, and no crepitation.   Pulses:  R and L carotid,radial,femoral,dorsalis pedis and posterior tibial pulses are full and equal bilaterally Extremities:  No clubbing, cyanosis, edema, or deformity noted with normal full range of motion of all joints.   Neurologic:  alert & oriented X3, cranial nerves II-XII intact, strength normal in all extremities, sensation intact to light touch, gait normal, toes down bilaterally on Babinski, Romberg negative, RUE hyporeflexia, RLE hyporeflexia, LUE hyporeflexia, and LLE hyporeflexia.   Skin:  turgor normal, color  normal, no rashes, no suspicious lesions, no ecchymoses, no petechiae, no purpura, no ulcerations, and no edema.   Cervical Nodes:  no anterior cervical adenopathy and no posterior cervical adenopathy.   Axillary Nodes:  no R axillary adenopathy and no L axillary adenopathy.   Psych:  Cognition and judgment appear intact. Alert and cooperative with normal attention span and concentration. No apparent delusions, illusions, hallucinations   Impression & Recommendations:  Problem # 1:  KIDNEY DISEASE, CHRONIC, STAGE I (ICD-585.1) Assessment New  Orders: Venipuncture (40347) T-SPE w/reflex to IFE ([id number]) TLB-BMP (Basic Metabolic Panel-BMET) (80048-METABOL) TLB-CBC Platelet - w/Differential (85025-CBCD) TLB-IBC Pnl (Iron/FE;Transferrin) (83550-IBC)  Problem # 2:  LEUKOCYTOSIS UNSPECIFIED (ICD-288.60) will look for lymphoproliferative disease with more labs,  Orders: Venipuncture (64332) T-SPE w/reflex to IFE  ([id number]) TLB-BMP (Basic Metabolic Panel-BMET) (80048-METABOL) TLB-CBC Platelet - w/Differential (85025-CBCD) TLB-IBC Pnl (Iron/FE;Transferrin) (83550-IBC)  Problem # 3:  HYPERTENSION (ICD-401.9)  Her updated medication list for this problem includes:    Furosemide 20 Mg Tabs (Furosemide) .Marland Kitchen... Take 1 tablet by mouth once a day    Diovan 160 Mg Tabs (Valsartan) .Marland Kitchen... Take 1 tablet by mouth once a day    Metoprolol Tartrate 50 Mg Tabs (Metoprolol tartrate) .Marland Kitchen... Take 1 tablet by mouth two times a day  Orders: Venipuncture (63016) T-SPE w/reflex to IFE ([id number]) TLB-BMP (Basic Metabolic Panel-BMET) (80048-METABOL) TLB-CBC Platelet - w/Differential (85025-CBCD) TLB-IBC Pnl (Iron/FE;Transferrin) (83550-IBC)  BP today: 120/64 Prior BP: 102/52 (03/01/2010)  Prior 10 Yr Risk Heart Disease: N/A (03/01/2010)  Labs Reviewed: K+: 4.1 (03/01/2010) Creat: : 1.4 (03/01/2010)   Chol: 140 (03/01/2010)   HDL: 47.10 (03/01/2010)   LDL: 78 (03/01/2010)   TG: 73.0 (03/01/2010)  Problem # 4:  HYPERLIPIDEMIA (ICD-272.4) Assessment: Improved  Her updated medication list for this problem includes:    Crestor 5 Mg Tabs (Rosuvastatin calcium) .Marland Kitchen... Take 1 tablet by mouth once a day  Labs Reviewed: SGOT: 18 (03/01/2010)   SGPT: 12 (03/01/2010)  Lipid Goals: Chol Goal: 200 (03/01/2010)   HDL Goal: 40 (03/01/2010)   LDL Goal: 100 (03/01/2010)   TG Goal: 150 (03/01/2010)  Prior 10 Yr Risk Heart Disease: N/A (03/01/2010)   HDL:47.10 (03/01/2010)  LDL:78 (03/01/2010)  Chol:140 (03/01/2010)  Trig:73.0 (03/01/2010)  Complete Medication List: 1)  Furosemide 20 Mg Tabs (Furosemide) .... Take 1 tablet by mouth once a day 2)  Diovan 160 Mg Tabs (Valsartan) .... Take 1 tablet by mouth once a day 3)  Metoprolol Tartrate 50 Mg Tabs (Metoprolol tartrate) .... Take 1 tablet by mouth two times a day 4)  Crestor 5 Mg Tabs (Rosuvastatin calcium) .... Take 1 tablet by mouth once a day 5)  Bayer Aspirin 325  Mg Tabs (Aspirin) .... Take 1 tablet by mouth once a day  Patient Instructions: 1)  Please schedule a follow-up appointment in 4 months. 2)  It is important that you exercise regularly at least 20 minutes 5 times a week. If you develop chest pain, have severe difficulty breathing, or feel very tired , stop exercising immediately and seek medical attention. 3)  You need to lose weight. Consider a lower calorie diet and regular exercise.  4)  Schedule your mammogram. 5)  Check your Blood Pressure regularly. If it is above 130/80: you should make an appointment.

## 2010-08-14 NOTE — Assessment & Plan Note (Signed)
Summary: NEW/[employer]ICARE/#/CD   Vital Signs:  Patient profile:   77 year old female Height:      55 inches Weight:      227.75 pounds BMI:     53.13 O2 Sat:      97 % on Room air Temp:     98.8 degrees F oral Pulse rate:   66 / minute Pulse rhythm:   regular Resp:     16 per minute BP sitting:   102 / 52  (left arm) Cuff size:   large  Vitals Entered By: Rock Nephew CMA (March 01, 2010 1:24 PM)  Nutrition Counseling: Patient's BMI is greater than 25 and therefore counseled on weight management options.  O2 Flow:  Room air CC: New to establish// discuss Bilateral arm/leg numbness, Lipid Management, Preventive Care Is Patient Diabetic? No Pain Assessment Patient in pain? no       Does patient need assistance? Functional Status Self care Ambulation Normal   Primary Care Gabrial Poppell:  Etta Grandchild MD  CC:  New to establish// discuss Bilateral arm/leg numbness, Lipid Management, and Preventive Care.  History of Present Illness: New to me she complains of a one month hx. of the gradual onset of numbness in her arms and legs.  Lipid Management History:      Positive NCEP/ATP III risk factors include female age 75 years old or older, hypertension, and ASHD (either angina/prior MI/prior CABG).  Negative NCEP/ATP III risk factors include no history of early menopause without estrogen hormone replacement, non-diabetic, no family history for ischemic heart disease, non-tobacco-user status, no prior stroke/TIA, no peripheral vascular disease, and no history of aortic aneurysm.        The patient states that she knows about the "Therapeutic Lifestyle Change" diet.  Her compliance with the TLC diet is not at all.  The patient expresses understanding of adjunctive measures for cholesterol lowering.  Adjunctive measures started by the patient include fiber and limit alcohol consumpton.  She expresses no side effects from her lipid-lowering medication.  The patient denies any  symptoms to suggest myopathy or liver disease.    Preventive Screening-Counseling & Management  Alcohol-Tobacco     Alcohol drinks/day: 0     Smoking Status: never  Caffeine-Diet-Exercise     Does Patient Exercise: no  Hep-HIV-STD-Contraception     Hepatitis Risk: no risk noted     HIV Risk: no risk noted     STD Risk: no risk noted     Dental Visit-last 6 months yes     SBE monthly: yes     SBE Education/Counseling: to perform regular SBE  Safety-Violence-Falls     Seat Belt Use: yes     Helmet Use: yes     Firearms in the Home: no firearms in the home     Smoke Detectors: yes     Violence in the Home: no risk noted     Sexual Abuse: no      Sexual History:  not active.        Drug Use:  no.        Blood Transfusions:  no.    Current Medications (verified): 1)  Furosemide 20 Mg Tabs (Furosemide) .... Take 1 Tablet By Mouth Once A Day 2)  Diovan 160 Mg Tabs (Valsartan) .... Take 1 Tablet By Mouth Once A Day 3)  Metoprolol Tartrate 50 Mg Tabs (Metoprolol Tartrate) .... Take 1 Tablet By Mouth Two Times A Day 4)  Crestor 5 Mg Tabs (  Rosuvastatin Calcium) .... Take 1 Tablet By Mouth Once A Day 5)  Bayer Aspirin 325 Mg Tabs (Aspirin) .... Take 1 Tablet By Mouth Once A Day  Allergies (verified): 1)  ! 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 Smoked Alcohol use-no Drug use-no Regular exercise-no Smoking Status:  never Drug Use:  no Does Patient Exercise:  no Hepatitis Risk:  no risk noted HIV Risk:  no risk noted STD Risk:  no risk noted Dental Care w/in 6 mos.:  yes Seat Belt Use:  yes Sexual History:  not active Blood Transfusions:  no  Review of Systems  The patient denies anorexia,  fever, weight loss, weight gain, chest pain, syncope, dyspnea on exertion, peripheral edema, prolonged cough, headaches, hemoptysis, abdominal pain, hematochezia, hematuria, muscle weakness, suspicious skin lesions, transient blindness, difficulty walking, depression, enlarged lymph nodes, angioedema, and breast masses.   Neuro:  Complains of numbness; denies difficulty with concentration, disturbances in coordination, falling down, headaches, memory loss, poor balance, seizures, sensation of room spinning, tingling, tremors, visual disturbances, and weakness.  Physical Exam  General:  alert, well-developed, well-nourished, well-hydrated, appropriate dress, normal appearance, healthy-appearing, cooperative to examination, and good hygiene.   Head:  normocephalic, atraumatic, no abnormalities observed, and no abnormalities palpated.   Eyes:  vision grossly intact, pupils equal, pupils round, and pupils reactive to light.   Ears:  R ear normal and L ear normal.   Nose:  External nasal examination shows no deformity or inflammation. Nasal mucosa are pink and moist without lesions or exudates. Mouth:  edentulous.   Neck:  supple, full ROM, no masses, no thyromegaly, no JVD, normal carotid upstroke, no carotid bruits, and no cervical lymphadenopathy.   Chest Wall:  no deformities, no tenderness, and no mass.   Lungs:  normal respiratory effort, no intercostal retractions, no accessory muscle use, normal breath sounds, no dullness, no fremitus, no crackles, and no wheezes.   Heart:  normal rate, regular rhythm, no murmur, no gallop, no rub, and no JVD.   Abdomen:  soft, non-tender, normal bowel sounds, no distention, no masses, no guarding, no rigidity, no rebound tenderness, no abdominal hernia, no inguinal hernia, no hepatomegaly, and no splenomegaly.   Msk:  normal ROM, no joint tenderness, no joint swelling, no joint warmth, no redness over joints, no joint deformities, no joint instability, and no  crepitation.   Pulses:  R and L carotid,radial,femoral,dorsalis pedis and posterior tibial pulses are full and equal bilaterally Extremities:  No clubbing, cyanosis, edema, or deformity noted with normal full range of motion of all joints.   Neurologic:  alert & oriented X3, cranial nerves II-XII intact, strength normal in all extremities, sensation intact to light touch, gait normal, toes down bilaterally on Babinski, Romberg negative, RUE hyporeflexia, RLE hyporeflexia, LUE hyporeflexia, and LLE hyporeflexia.   Skin:  turgor normal, color normal, no rashes, no suspicious lesions, no ecchymoses, no petechiae, no purpura, no ulcerations, and no edema.   Cervical Nodes:  no anterior cervical adenopathy and no posterior cervical adenopathy.   Axillary Nodes:  no R axillary adenopathy and no L axillary adenopathy.   Inguinal Nodes:  no R inguinal adenopathy and no L inguinal adenopathy.   Psych:  Cognition and judgment appear intact. Alert and cooperative with normal attention span and concentration. No apparent delusions,  illusions, hallucinations   Impression & Recommendations:  Problem # 1:  PARESTHESIA (ICD-782.0) Assessment New will check for metabolic disease  Orders: Venipuncture (42706) TLB-B12 + Folate Pnl ([id number]-V61/YWV) TLB-BMP (Basic Metabolic Panel-BMET) (80048-METABOL) TLB-Lipid Panel (80061-LIPID) TLB-CBC Platelet - w/Differential (85025-CBCD) TLB-Hepatic/Liver Function Pnl (80076-HEPATIC) TLB-TSH (Thyroid Stimulating Hormone) (84443-TSH) TLB-Sedimentation Rate (ESR) (85652-ESR)  Problem # 2:  HYPERTENSION (ICD-401.9) Assessment: Unchanged  Her updated medication list for this problem includes:    Furosemide 20 Mg Tabs (Furosemide) .Marland Kitchen... Take 1 tablet by mouth once a day    Diovan 160 Mg Tabs (Valsartan) .Marland Kitchen... Take 1 tablet by mouth once a day    Metoprolol Tartrate 50 Mg Tabs (Metoprolol tartrate) .Marland Kitchen... Take 1 tablet by mouth two times a day  Orders: Venipuncture  (37106) TLB-B12 + Folate Pnl ([id number]-O35/KKX) TLB-BMP (Basic Metabolic Panel-BMET) (80048-METABOL) TLB-Lipid Panel (80061-LIPID) TLB-CBC Platelet - w/Differential (85025-CBCD) TLB-Hepatic/Liver Function Pnl (80076-HEPATIC) TLB-TSH (Thyroid Stimulating Hormone) (84443-TSH) TLB-Sedimentation Rate (ESR) (85652-ESR)  BP today: 102/52  Problem # 3:  HYPERLIPIDEMIA (ICD-272.4) Assessment: Unchanged  Her updated medication list for this problem includes:    Crestor 5 Mg Tabs (Rosuvastatin calcium) .Marland Kitchen... Take 1 tablet by mouth once a day  Orders: Venipuncture (38182) TLB-B12 + Folate Pnl ([id number]-L38/BOF) TLB-BMP (Basic Metabolic Panel-BMET) (80048-METABOL) TLB-Lipid Panel (80061-LIPID) TLB-CBC Platelet - w/Differential (85025-CBCD) TLB-Hepatic/Liver Function Pnl (80076-HEPATIC) TLB-TSH (Thyroid Stimulating Hormone) (84443-TSH) TLB-Sedimentation Rate (ESR) (85652-ESR)  Complete Medication List: 1)  Furosemide 20 Mg Tabs (Furosemide) .... Take 1 tablet by mouth once a day 2)  Diovan 160 Mg Tabs (Valsartan) .... Take 1 tablet by mouth once a day 3)  Metoprolol Tartrate 50 Mg Tabs (Metoprolol tartrate) .... Take 1 tablet by mouth two times a day 4)  Crestor 5 Mg Tabs (Rosuvastatin calcium) .... Take 1 tablet by mouth once a day 5)  Bayer Aspirin 325 Mg Tabs (Aspirin) .... Take 1 tablet by mouth once a day  Other Orders: Radiology Referral (Radiology)  Lipid Assessment/Plan:      Based on NCEP/ATP III, the patient's risk factor category is "history of coronary disease, peripheral vascular disease, cerebrovascular disease, or aortic aneurysm".  The patient's lipid goals are as follows: Total cholesterol goal is 200; LDL cholesterol goal is 100; HDL cholesterol goal is 40; Triglyceride goal is 150.    Colorectal Screening:  Current Recommendations:    Hemoccult: patient refused    Colonoscopy recommended: patient refused  PAP Screening:    Hx Cervical Dysplasia in last 5  yrs? No    3 normal PAP smears in last 5 yrs? No    Reviewed PAP smear recommendations:  patient refuses understanding risks of delayed diagnosis  Mammogram Screening:    Reviewed Mammogram recommendations:  mammogram ordered  Osteoporosis Risk Assessment:  Risk Factors for Fracture or Low Bone Density:   Smoking status:       never  Immunization & Chemoprophylaxis:    Tetanus vaccine: Historical  (07/16/1999)   Patient Instructions: 1)  Please schedule a follow-up appointment in 2 weeks.  Preventive Care Screening  Last Tetanus Booster:    Date:  07/16/1999    Results:  Historical   Colonoscopy:    Date:  07/15/1998    Results:  normal
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
## 2010-08-16 NOTE — Letter (Signed)
Summary: EGD Instructions  [facility]  [address] [place], [place] [address]   Phone: [phone]  Fax: [phone]       Shawntavia DEMEDEIROS    Dec 03, 1924    MRN: [id number]       Procedure Day Dorna Bloom:   WEDNESDAY-1/11/2012_     Arrival Time: 8:30 a.m.    Procedure Time:9:30 a.m.     Location of Procedure:                     [facility] ( Outpatient Registration)  PREPARATION FOR ENDOSCOPY/possible dilatation   On Wednesday-Jan.11,2012- THE DAY OF THE PROCEDURE:  1.   No solid foods, milk or milk products are allowed after midnight the night before your procedure.  2.   Do not drink anything colored red or purple.  Avoid juices with pulp.  No orange juice.  3.  You may drink clear liquids until 5:30 a.m. which is 4 hours before your procedure.                                                                                                CLEAR LIQUIDS INCLUDE: Water Jello Ice Popsicles Tea (sugar ok, no milk/cream) Powdered fruit flavored drinks Coffee (sugar ok, no milk/cream) Gatorade Juice: apple, white grape, white cranberry  Lemonade Clear bullion, consomm, broth Carbonated beverages (any kind) Strained chicken noodle soup Hard Candy   MEDICATION INSTRUCTIONS You should take regular prescription medications with a small sip of water as early as possible the morning of your procedure.,except hold your fluid  pill until after the procedure.  .            OTHER INSTRUCTIONS  You will need a responsible adult at least 75 years of age to accompany you and drive you home.   This person must remain in the waiting room during your procedure.  Wear loose fitting clothing that is easily removed.  Leave jewelry and other valuables at home.  However, you may wish to bring a book to read or an iPod/MP3 player to listen to music as you wait for your procedure to start.  Remove all body piercing jewelry and leave at home.  Total time from  sign-in until discharge is approximately 2-3 hours.  You should go home directly after your procedure and rest.  You can resume normal activities the day after your procedure.  The day of your procedure you should not:   Drive   Make legal decisions   Operate machinery   Drink alcohol   Return to work  You will receive specific instructions about eating, activities and medications before you leave.    The above instructions have been reviewed and explained to me by   Cheryl______________________    I fully understand and can verbalize these instructions __Discussed via phone and mailed pt.___________________________ Date_12/30/2012

## 2010-08-16 NOTE — Procedures (Signed)
Summary: Upper Endoscopy w/DIL  Patient: Natalie Goodman Note: All result statuses are Final unless otherwise noted.  Tests: (1) Upper Endoscopy w/DIL (UED)  UED Upper Endoscopy w/DIL                             DONE     [facility]     [address] [place], [place]  [address]           ENDOSCOPY PROCEDURE REPORT           PATIENT:  Natalie Goodman, Natalie Goodman  MR#:  [id number]     BIRTHDATE:  05/29/1925, 85 yrs. old  GENDER:  female           ENDOSCOPIST:  Iva Boop, MD, [facility]           PROCEDURE DATE:  07/25/2010     PROCEDURE:  EGD with biopsy, EGD with dilatation over guidewire     ASA CLASS:  Class II     INDICATIONS:  1) dysphagia  2) dilation of esophageal stricture           MEDICATIONS:   Fentanyl 25 mcg IV, Versed 4 mg     TOPICAL ANESTHETIC:  Cetacaine Spray           DESCRIPTION OF PROCEDURE:   After the risks benefits and     alternatives of the procedure were thoroughly explained, informed     consent was obtained.  The  endoscope was introduced through the     mouth and advanced to the second portion of the duodenum, without     limitations.  The instrument was slowly withdrawn as the mucosa     was carefully examined.     <<PROCEDUREIMAGES>>           Multiple rings were seen in the esophagus. in the total esophagus.     Adult gastroscope would not pass. The pediatric gastroscope passed     to the ge junction only at first. It did pass to stomach after 9     mm dilation.  Abnormal mucosa in the total esophagus. Pale mucosa     with fissures and white dots. Suggestive of eosinophilic     esophagitis but prior biopsies did not show that. Multiple     biopsies were obtained and sent to pathology.  The examination was     otherwise normal.    Dilation was then performed at the total     esophagus           1) Dilator:  Savary over guidewire w/fluoroscopy  Size(s):  8 and     9 mm     Resistance:  moderate  Heme:  yes     Appearance:  adequate     Able to pass  through ge junction after 9 mm dilation.           COMPLICATIONS:  None           ENDOSCOPIC IMPRESSION:     1) Rings, multiple in the total esophagus which is very narrow     overall- dilated to 9 mm     2) Abnormal mucosa in the total esophagus - last biopsies showed     Candida (treated). This looks like eosinophilic esophagitis.     3) Otherwise normal examination.     RECOMMENDATIONS:     1) Clear liquids until noon then liquid diet  only today.     2) Try her typical food consistencies tomorrow.     3) I will follow uop the biopsies and determine next step.           REPEAT EXAM:  to be determined           Iva Boop, MD, [facility]           CC:  The Patient     Etta Grandchild, M.D.           n.     eSIGNED:   Iva Boop at 07/25/2010 10:38 AM           [name], [id number]  Note: An exclamation mark (!) indicates a result that was not dispersed into the flowsheet. Document Creation Date: 07/25/2010 10:39 AM _______________________________________________________________________  (1) Order result status: Final Collection or observation date-time: 07/25/2010 10:23 Requested date-time:  Receipt date-time:  Reported date-time:  Referring Physician:   Ordering Physician: Stan Head [phone]) Specimen Source:  Source: Launa Grill Order Number: [id number] Lab site:

## 2010-08-16 NOTE — Progress Notes (Signed)
Summary: needs ba swallow  Phone Note Outgoing Call   Summary of Call: I did not note on esophagoscapy report but she will need a barium swallow, NO TABLET re: dysphagia and esophageal stricture please call her tomorrow or next day to arrange Iva Boop MD, [facility]  June 27, 2010 2:12 PM   Follow-up for Phone Call        Spoke with patient and let her know she is scheduled for Barium Swallow with NO PILL for 07/04/10 @9am . Patient instructed to be NPO 3 hours prior to test. She knows to check in at 8:30am. Scheduled with Lyla Son. Follow-up by: Selinda Michaels RN,  June 28, 2010 11:04 AM  New Problems: ESOPHAGEAL STRICTURE (ICD-530.3)   New Problems: ESOPHAGEAL STRICTURE (ICD-530.3)

## 2010-08-16 NOTE — Progress Notes (Signed)
Summary: candida in esophagus  Phone Note Outgoing Call   Summary of Call: let her know esophageal biopsies show infection with Candida  needs treatment with fluconazole as prescribed still needs Barium swallow as planned and will follow-up with her by phone after that Iva Boop MD, [facility]  June 28, 2010 8:30 PM   Follow-up for Phone Call        Spoke with patient and gave her Dr. Marvell Fuller recommendations. Patient will get rx and take until gone. She will have ther Barium Swallow on 07/04/10 as planned. Rx sent to patient's pharmacy. Follow-up by: Jesse Fall RN,  June 29, 2010 10:13 AM    New/Updated Medications: FLUCONAZOLE 100 MG TABS (FLUCONAZOLE) 2 by mouth the first day then 1 by mouth once daily until gone Prescriptions: FLUCONAZOLE 100 MG TABS (FLUCONAZOLE) 2 by mouth the first day then 1 by mouth once daily until gone  #22 x 0   Entered and Authorized by:   Iva Boop MD, [facility]   Signed by:   Iva Boop MD, FACG on 06/28/2010   Method used:   Electronically to        [facility] [phone]* (retail)       [address]       [place], [place]  [address]       Ph: [phone]       Fax: [phone]   RxID:   [id number]

## 2010-08-16 NOTE — Procedures (Signed)
Summary: Upper Endoscopy  Patient: [name] Note: All result statuses are Final unless otherwise noted.  Tests: (1) Upper Endoscopy (EGD)   EGD Upper Endoscopy       DONE     [facility]     [address] [place], [place]  [address]           ENDOSCOPY PROCEDURE REPORT           PATIENT:  Natalie Goodman, Natalie Goodman  MR#:  [id number]     BIRTHDATE:  Jan 10, 1925, 85 yrs. old  GENDER:  female           ENDOSCOPIST:  Iva Boop, MD, [facility]           PROCEDURE DATE:  06/27/2010     PROCEDURE:  Esophagoscopy with biopsy     ASA CLASS:  Class III     INDICATIONS:  dysphagia           MEDICATIONS:   Fentanyl 50 mcg, Versed 4 mg     TOPICAL ANESTHETIC:  Cetacaine Spray           DESCRIPTION OF PROCEDURE:   After the risks benefits and     alternatives of the procedure were thoroughly explained, informed     consent was obtained.  The  endoscope was introduced through the     mouth and advanced to the esophagus mid, limited by an     obstruction.   The instrument was slowly withdrawn as the mucosa     was fully examined.     <<PROCEDUREIMAGES>>           Multiple rings were seen in the proximal esophagus. The entire     esophagus seen was was stenotic, moreso in the distal aspect of     proximal esophagus and the muid esophagus. This did not permit     passage of the adult gastroscope nor pediatric gastroscope and the     very proximal esophageal mucosa was disrupted and some minor     bleeding occurred.  Abnormal appearing mucosa in the proximal     esophagus. Bland mucosa with patchy erythema and some white     pinpoint nodules, along with the rings suggests eosinophilic     esophagitis. Multiple biopsies were obtained and sent to     pathology.    Retroflexion was not performed.  The scope was then     withdrawn from the patient and the procedure completed.           COMPLICATIONS:  None           ENDOSCOPIC IMPRESSION:     1) Rings, multiple in the esophagus     2)  Stenosis (diffuse) in the esophagus     3) Abnormal mucosa in the proximal esophagus - overall picture     suggests eosinophilic esophagitis but connective tissue disease is     a possibility also.     RECOMMENDATIONS:     Liquids only today, start with clears and go to full liquids at     3PM if ok.     Try soft foods as tolerated tomorrow.     My office will call with biopsy results and plans.           Iva Boop, MD, [facility]           CC:  Etta Grandchild, MD     The Patient  n.     eSIGNED:   Iva Boop at 06/27/2010 01:37 PM           [name], [id number]  Note: An exclamation mark (!) indicates a result that was not dispersed into the flowsheet. Document Creation Date: 06/27/2010 1:38 PM _______________________________________________________________________  (1) Order result status: Final Collection or observation date-time: 06/27/2010 13:24 Requested date-time:  Receipt date-time:  Reported date-time:  Referring Physician:   Ordering Physician: Stan Head [phone]) Specimen Source:  Source: Launa Grill Order Number: [id number] Lab site:   Appended Document: Upper Endoscopy   EGD  Procedure date:  06/27/2010  Findings:       1) Rings, multiple in the esophagus     2) Stenosis (diffuse) in the esophagus     3) Abnormal mucosa in the proximal esophagus - overall picture     suggests eosinophilic esophagitis but connective tissue disease is     a possibility also.  - BENIGN SQUAMOUS MUCOSA WITH ACUTE AND CHRONIC INFLAMMATION ASSOCIATED WITH FUNGAL ORGANISMS.  NO INTESTINAL METAPLASIA IDENTIFIED.

## 2010-08-16 NOTE — Progress Notes (Signed)
Summary: f/u on dysphagia  Phone Note Outgoing Call   Summary of Call: Please contact her and ask her/family if swallowing has improved on fluconazole. Iva Boop MD, [facility]  July 12, 2010 1:51 PM   Follow-up for Phone Call        Pt. doing much better she no longer has dysphagia with solids or liquids.Still breaks pills in half so she doesn't choke. Follow-up by: Teryl Lucy RN,  July 12, 2010 2:01 PM  Additional Follow-up for Phone Call Additional follow up Details #1::        ok, good we need to set her up for egd/possible dili during my hospital week in Jan - WITH FLUORO Additional Follow-up by: Iva Boop MD, [facility],  July 12, 2010 2:09 PM    Additional Follow-up for Phone Call Additional follow up Details #2::    Pt. will contact nephew about transportation for EGD/dil and call back.   Teryl Lucy RN  July 12, 2010 2:44 PM Pt. scheduled for procedure at [facility] on 07/25/2010 at 9:30 a.m.Instructions discussed with pt. via phone and mailed to her. Follow-up by: Teryl Lucy RN,  July 13, 2010 11:16 AM

## 2010-09-04 ENCOUNTER — Encounter: Payer: Self-pay | Admitting: Internal Medicine

## 2010-09-04 ENCOUNTER — Ambulatory Visit (INDEPENDENT_AMBULATORY_CARE_PROVIDER_SITE_OTHER): Payer: Medicare PPO | Admitting: Internal Medicine

## 2010-09-04 DIAGNOSIS — K222 Esophageal obstruction: Secondary | ICD-10-CM

## 2010-09-04 DIAGNOSIS — K219 Gastro-esophageal reflux disease without esophagitis: Secondary | ICD-10-CM
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
## 2010-09-28 LAB — GLUCOSE, CAPILLARY: Glucose-Capillary: 112 mg/dL — ABNORMAL HIGH (ref 70–99)

## 2011-04-28 ENCOUNTER — Inpatient Hospital Stay (HOSPITAL_COMMUNITY)
Admission: EM | Admit: 2011-04-28 | Discharge: 2011-05-01 | DRG: 690 | Disposition: A | Discharge status: Home Health | Payer: Medicare PPO | Attending: Internal Medicine | Admitting: Internal Medicine

## 2011-04-28 ENCOUNTER — Emergency Department (HOSPITAL_COMMUNITY): Payer: Medicare PPO

## 2011-04-28 DIAGNOSIS — Z96659 Presence of unspecified artificial knee joint: Secondary | ICD-10-CM

## 2011-04-28 DIAGNOSIS — I10 Essential (primary) hypertension: Secondary | ICD-10-CM | POA: Diagnosis present

## 2011-04-28 DIAGNOSIS — Z6841 Body Mass Index (BMI) 40.0 and over, adult: Secondary | ICD-10-CM

## 2011-04-28 DIAGNOSIS — N10 Acute tubulo-interstitial nephritis: Principal | ICD-10-CM | POA: Diagnosis present

## 2011-04-28 DIAGNOSIS — E785 Hyperlipidemia, unspecified: Secondary | ICD-10-CM | POA: Diagnosis present

## 2011-04-28 DIAGNOSIS — M47817 Spondylosis without myelopathy or radiculopathy, lumbosacral region: Secondary | ICD-10-CM | POA: Diagnosis present

## 2011-04-28 LAB — URINALYSIS, ROUTINE W REFLEX MICROSCOPIC
Glucose, UA: NEGATIVE mg/dL
Nitrite: NEGATIVE
Specific Gravity, Urine: 1.015 (ref 1.005–1.030)
pH: 8.5 — ABNORMAL HIGH (ref 5.0–8.0)

## 2011-04-28 LAB — URINE MICROSCOPIC-ADD ON

## 2011-04-28 LAB — BASIC METABOLIC PANEL
CO2: 25 mEq/L (ref 19–32)
Chloride: 100 mEq/L (ref 96–112)
GFR calc Af Amer: 54 mL/min — ABNORMAL LOW (ref >90)
Potassium: 4.6 mEq/L (ref 3.5–5.1)

## 2011-04-29 ENCOUNTER — Observation Stay (HOSPITAL_COMMUNITY): Payer: Medicare PPO

## 2011-04-29 LAB — CBC
MCV: 94.1 fL (ref 78.0–100.0)
Platelets: 213 10*3/uL (ref 150–400)
RDW: 14.1 % (ref 11.5–15.5)
WBC: 13.9 10*3/uL — ABNORMAL HIGH (ref 4.0–10.5)

## 2011-04-29 LAB — BASIC METABOLIC PANEL
CO2: 20 mEq/L (ref 19–32)
Calcium: 9.4 mg/dL (ref 8.4–10.5)
Creatinine, Ser: 1.01 mg/dL (ref 0.50–1.10)
GFR calc Af Amer: 58 mL/min — ABNORMAL LOW (ref >90)
GFR calc non Af Amer: 50 mL/min — ABNORMAL LOW (ref >90)
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
## 2011-04-30 LAB — COMPREHENSIVE METABOLIC PANEL
AST: 16 U/L (ref 0–37)
Albumin: 3 g/dL — ABNORMAL LOW (ref 3.5–5.2)
Alkaline Phosphatase: 84 U/L (ref 39–117)
BUN: 25 mg/dL — ABNORMAL HIGH (ref 6–23)
Chloride: 105 mEq/L (ref 96–112)
Potassium: 4.2 mEq/L (ref 3.5–5.1)
Total Bilirubin: 0.9 mg/dL (ref 0.3–1.2)

## 2011-04-30 LAB — DIFFERENTIAL
Basophils Absolute: 0 10*3/uL (ref 0.0–0.1)
Eosinophils Relative: 3 % (ref 0–5)
Lymphocytes Relative: 14 % (ref 12–46)
Neutrophils Relative %: 71 % (ref 43–77)

## 2011-04-30 LAB — CBC
HCT: 31.3 % — ABNORMAL LOW (ref 36.0–46.0)
Platelets: 204 10*3/uL (ref 150–400)
RDW: 13.9 % (ref 11.5–15.5)
WBC: 14 10*3/uL — ABNORMAL HIGH (ref 4.0–10.5)

## 2011-05-01 NOTE — Discharge Summary (Signed)
Natalie Goodman, POLLAK NO.:  [id number]  MEDICAL RECORD NO.:  [id number]  LOCATION:  1528                         FACILITY:  [facility]  PHYSICIAN:  Talmage Nap, MD  DATE OF BIRTH:  Feb 27, 1948  DATE OF ADMISSION:  04/28/2011 DATE OF DISCHARGE:  05/01/2011                        DISCHARGE SUMMARY - REFERRING   PRIMARY CARE PHYSICIAN:  Camillia Herter. Sheliah Hatch, MD  DISCHARGE DIAGNOSES: 1. Urinary tract infection/pyelonephritis. 2. Morbid obesity. 3. Hypertension. 4. Dyslipidemia. 5. Advanced spondylosis.  HISTORY:  The patient is an 77 year old African American female with history of hypertension, morbidly obese, was admitted to the hospital on April 28, 2011 by Dr. Gery Pray with 1-day history of left flank pain.  The patient was said to have described the pain as 10/10 in intensity and was constant.  Associated with the pain was fever and chills.  She denied any nausea or vomiting.  She denied any dysuria. Symptom was said to have persisted.  Hence the patient presented to the hospital to be evaluated.  MEDICATIONS:  Her preadmission medications without dosages include, 1. Metoprolol. 2. Diovan. 3. Crestor. 4. Lasix.  PAST SURGICAL HISTORY: 1. Right total knee replacement. 2. Hysterectomy.  ALLERGIES:  CODEINE.  SOCIAL HISTORY:  Negative for alcohol or tobacco use.  The patient lives alone.  FAMILY HISTORY:  [place] Spaniel to be significant for malignancy.  Type is unknown.  REVIEW OF SYSTEMS:  Essentially documented in the initial history and physical.  PHYSICAL EXAMINATION:  VITAL SIGNS:  At the time the patient was seen by the admitting physician, blood pressure is 147/58, pulse 78, respiratory rate 18, and temperature is 97.5.  She was said to be saturating 98% on room air. HEENT:  Pupils are reactive to light and extraocular muscles are intact. NECK:  No jugular venous distention.  No carotid bruit.  No lymphadenopathy. CHEST:  Said to be clear  to auscultation. HEART:  Heart sounds are 1 and 2. ABDOMEN:  Soft and nontender.  Liver, spleen, and kidney not palpable. Bowel sounds are positive. EXTREMITIES:  Show pedal edema. NEUROLOGIC:  Nonfocal. MUSCULOSKELETAL SYSTEM:  Show arthritic changes in the knees and in the feet. NEUROPSYCHIATRIC:  Unremarkable.  LABORATORY DATA:  Basic metabolic panel showed sodium of 137, potassium of 4.7, chloride of 100 with a bicarbonate of 25, glucose is 110, BUN is 26, and creatinine is 1.06.  Urinalysis showed small leukocyte esterase with negative nitrite.  Urine microscopy showed wbc's 11 to 20 with few bacteria.  Complete blood count with differential showed WBC of 13.9, hemoglobin of 10.1, hematocrit of 33.2, MCV of 94.1 with a platelet count of 213.  A repeat complete blood count with differential done on April 30, 2011, showed WBC of 14.0, hemoglobin of 10.0, hematocrit of 31.3, MCV of 94.2 with a platelet count of 204.  Comprehensive metabolic panel showed sodium of 137, potassium of 4.2, chloride of 105 with a bicarbonate of 23, glucose is 103, BUN is 25, creatinine is 1.21, and magnesium level is 2.6.  IMAGING STUDIES:  X-ray of the lumbar spine, which showed advanced spondylosis.  One-view of the abdomen showed nonobstructive bowel gas pattern.  No acute findings seen.  HOSPITAL COURSE:  The patient was admitted to general medical floor. She was started on Rocephin 1 g IV q.24 h., metoprolol 25 mg p.o. b.i.d., and Diovan 40 mg p.o. daily.  She was given Zofran for nausea and Lovenox 40 mg subcutaneously q.24h. for DVT prophylaxis.  Pain control was done with Tylenol, Percocet, and morphine 2 mg IV q.4 h. p.r.n.  At the time the patient was seen by me for the very first time, which was on April 29, 2011, the patient's IV Rocephin was continued and no major changes were made.  She was also given Ultram 50 mg p.o. t.i.d. for her advanced spondylosis.  The patient had PT and  OT evaluation done and recommended home health PT.  So far the patient has remained medically stable.  She was seen by me today.  Denied any complaint, i.e. no abdominal pain, no fever, no chills, no rigor.  No dysuria or hematuria.  Examination of the patient was essentially unremarkable.  Her vital signs, blood pressure is 117/57, pulse 73, respiratory rate 18, temperature is 98.0, and medically stable.  Plan is for the patient to be discharged home today and activity as tolerated. Low-sodium and low-cholesterol diet.  Follow up with her primary care physician in 1 to 2 weeks.  Medications to be taken at home include, 1. Cipro 500 mg 1 p.o. b.i.d. for the next 5 days. 2. Lasix 40 mg 1 p.o. b.i.d. 3. Crestor (rosuvastatin) 10 mg 1 p.o. daily. 4. Diovan (valsartan) 160 mg p.o. daily. 5. Metoprolol tartrate 50 mg 1 p.o. daily.     Talmage Nap, MD     CN/MEDQ  D:  05/01/2011  T:  05/01/2011  Job:  [id number]  cc:   Camillia Herter. Sheliah Hatch, M.D. Fax: [phone]  Electronically Signed by Talmage Nap  on 05/01/2011 06:58:38 PM

## 2011-06-05 ENCOUNTER — Ambulatory Visit (INDEPENDENT_AMBULATORY_CARE_PROVIDER_SITE_OTHER): Payer: Medicare PPO | Admitting: Internal Medicine

## 2011-06-05 ENCOUNTER — Other Ambulatory Visit (INDEPENDENT_AMBULATORY_CARE_PROVIDER_SITE_OTHER): Payer: Medicare PPO

## 2011-06-05 ENCOUNTER — Encounter: Payer: Self-pay | Admitting: Internal Medicine

## 2011-06-05 DIAGNOSIS — D72829 Elevated white blood cell count, unspecified: Secondary | ICD-10-CM

## 2011-06-05 DIAGNOSIS — E785 Hyperlipidemia, unspecified: Secondary | ICD-10-CM

## 2011-06-05 DIAGNOSIS — N181 Chronic kidney disease, stage 1: Secondary | ICD-10-CM

## 2011-06-05 DIAGNOSIS — D649 Anemia, unspecified: Secondary | ICD-10-CM | POA: Insufficient documentation

## 2011-06-05 DIAGNOSIS — Z79899 Other long term (current) drug therapy: Secondary | ICD-10-CM

## 2011-06-05 DIAGNOSIS — I10 Essential (primary) hypertension: Secondary | ICD-10-CM

## 2011-06-05 LAB — CBC WITH DIFFERENTIAL/PLATELET
Eosinophils Absolute: 0.4 10*3/uL (ref 0.0–0.7)
Lymphocytes Relative: 15.8 % (ref 12.0–46.0)
MCHC: 32.7 g/dL (ref 30.0–36.0)
MCV: 93.5 fl (ref 78.0–100.0)
Monocytes Absolute: 0.9 10*3/uL (ref 0.1–1.0)
Neutrophils Relative %: 71.2 % (ref 43.0–77.0)
Platelets: 215 10*3/uL (ref 150.0–400.0)
WBC: 9.7 10*3/uL (ref 4.5–10.5)

## 2011-06-05 LAB — COMPREHENSIVE METABOLIC PANEL
ALT: 13 U/L (ref 0–35)
AST: 22 U/L (ref 0–37)
Albumin: 3.9 g/dL (ref 3.5–5.2)
Alkaline Phosphatase: 91 U/L (ref 39–117)
Chloride: 107 mEq/L (ref 96–112)
Potassium: 4.6 mEq/L (ref 3.5–5.1)
Sodium: 140 mEq/L (ref 135–145)
Total Protein: 8.3 g/dL (ref 6.0–8.3)

## 2011-06-05 LAB — LIPID PANEL
Cholesterol: 149 mg/dL (ref 0–200)
HDL: 49 mg/dL (ref >39.00)
LDL Cholesterol: 75 mg/dL (ref 0–99)
VLDL: 24.8 mg/dL (ref 0.0–40.0)

## 2011-06-05 NOTE — Assessment & Plan Note (Signed)
Her BP is well controlled 

## 2011-06-05 NOTE — Assessment & Plan Note (Signed)
I will recheck her CBC and look at her SPEP to see if she has a lymphoproliferative process

## 2011-06-05 NOTE — Assessment & Plan Note (Signed)
She is doing well on crestor, I will check her FLP today to see that she has met her lipid goals

## 2011-06-05 NOTE — Patient Instructions (Signed)
Hypertension As your heart beats, it forces blood through your arteries. This force is your blood pressure. If the pressure is too high, it is called hypertension (HTN) or high blood pressure. HTN is dangerous because you may have it and not know it. High blood pressure may mean that your heart has to work harder to pump blood. Your arteries may be narrow or stiff. The extra work puts you at risk for heart disease, stroke, and other problems.  Blood pressure consists of two numbers, a higher number over a lower, 110/72, for example. It is stated as "110 over 72." The ideal is below 120 for the top number (systolic) and under 80 for the bottom (diastolic). Write down your blood pressure today. You should pay close attention to your blood pressure if you have certain conditions such as:  Heart failure.   Prior heart attack.   Diabetes   Chronic kidney disease.   Prior stroke.   Multiple risk factors for heart disease.  To see if you have HTN, your blood pressure should be measured while you are seated with your arm held at the level of the heart. It should be measured at least twice. A one-time elevated blood pressure reading (especially in the Emergency Department) does not mean that you need treatment. There may be conditions in which the blood pressure is different between your right and left arms. It is important to see your caregiver soon for a recheck. Most people have essential hypertension which means that there is not a specific cause. This type of high blood pressure may be lowered by changing lifestyle factors such as:  Stress.   Smoking.   Lack of exercise.   Excessive weight.   Drug/tobacco/alcohol use.   Eating less salt.  Most people do not have symptoms from high blood pressure until it has caused damage to the body. Effective treatment can often prevent, delay or reduce that damage. TREATMENT  When a cause has been identified, treatment for high blood pressure is  directed at the cause. There are a large number of medications to treat HTN. These fall into several categories, and your caregiver will help you select the medicines that are best for you. Medications may have side effects. You should review side effects with your caregiver. If your blood pressure stays high after you have made lifestyle changes or started on medicines,   Your medication(s) may need to be changed.   Other problems may need to be addressed.   Be certain you understand your prescriptions, and know how and when to take your medicine.   Be sure to follow up with your caregiver within the time frame advised (usually within two weeks) to have your blood pressure rechecked and to review your medications.   If you are taking more than one medicine to lower your blood pressure, make sure you know how and at what times they should be taken. Taking two medicines at the same time can result in blood pressure that is too low.  SEEK IMMEDIATE MEDICAL CARE IF:  You develop a severe headache, blurred or changing vision, or confusion.   You have unusual weakness or numbness, or a faint feeling.   You have severe chest or abdominal pain, vomiting, or breathing problems.  MAKE SURE YOU:   Understand these instructions.   Will watch your condition.   Will get help right away if you are not doing well or get worse.  Document Released: 07/01/2005 Document Revised: 03/13/2011 Document Reviewed:   02/19/2008 [employer] Patient Information 2012 [place], [place].Anemia, Nonspecific Your exam and blood tests show you are anemic. This means your blood (hemoglobin) level is low. Normal hemoglobin values are 12 to 15 g/dL for females and 14 to 17 g/dL for males. Make a note of your hemoglobin level today. The hematocrit percent is also used to measure anemia. A normal hematocrit is 38% to 46% in females and 42% to 49% in males. Make a note of your hematocrit level today. CAUSES  Anemia can be due to  many different causes.  Excessive bleeding from periods (in women).   Intestinal bleeding.   Poor nutrition.   Kidney, thyroid, liver, and bone marrow diseases.  SYMPTOMS  Anemia can come on suddenly (acute). It can also come on slowly. Symptoms can include:  Minor weakness.   Dizziness.   Palpitations.   Shortness of breath.  Symptoms may be absent until half your hemoglobin is missing if it comes on slowly. Anemia due to acute blood loss from an injury or internal bleeding may require blood transfusion if the loss is severe. Hospital care is needed if you are anemic and there is significant continual blood loss. TREATMENT   Stool tests for blood (Hemoccult) and additional lab tests are often needed. This determines the best treatment.   Further checking on your condition and your response to treatment is very important. It often takes many weeks to correct anemia.  Depending on the cause, treatment can include:  Supplements of iron.   Vitamins B12 and folic acid.   Hormone medicines.If your anemia is due to bleeding, finding the cause of the blood loss is very important. This will help avoid further problems.  SEEK IMMEDIATE MEDICAL CARE IF:   You develop fainting, extreme weakness, shortness of breath, or chest pain.   You develop heavy vaginal bleeding.   You develop bloody or black, tarry stools or vomit up blood.   You develop a high fever, rash, repeated vomiting, or dehydration.  Document Released: 08/08/2004 Document Revised: 03/13/2011 Document Reviewed: 05/16/2009 [facility] Patient Information 2012 [place], [place].

## 2011-06-05 NOTE — Assessment & Plan Note (Signed)
I will recheck her CBC today and will look at her vitamin levels as well 

## 2011-06-05 NOTE — Progress Notes (Signed)
Subjective:    Patient ID: Natalie Goodman, female    DOB: [birth year], 77 y.o.   MRN: [id number]  Anemia Presents for follow-up visit. Symptoms include malaise/fatigue. There has been no abdominal pain, anorexia, bruising/bleeding easily, confusion, fever, leg swelling, light-headedness, pallor, palpitations, paresthesias, pica or weight loss. Signs of blood loss that are not present include hematemesis, hematochezia, melena and vaginal bleeding. Past medical history includes chronic renal disease. There is no history of hypothyroidism. There are no compliance problems.  Side effects of medications include fatigue.  Hypertension This is a chronic problem. The current episode started more than 1 year ago. The problem has been gradually improving since onset. The problem is controlled. Associated symptoms include malaise/fatigue. Pertinent negatives include no anxiety, blurred vision, chest pain, headaches, neck pain, orthopnea, palpitations, peripheral edema, PND, shortness of breath or sweats. There are no associated agents to hypertension. Past treatments include angiotensin blockers, beta blockers and diuretics. The current treatment provides moderate improvement. Compliance problems include exercise and diet.  Hypertensive end-organ damage includes kidney disease. Identifiable causes of hypertension include chronic renal disease.  Hyperlipidemia This is a chronic problem. The current episode started more than 1 year ago. The problem is controlled. Recent lipid tests were reviewed and are variable. Exacerbating diseases include chronic renal disease and obesity. She has no history of diabetes, hypothyroidism, liver disease or nephrotic syndrome. Factors aggravating her hyperlipidemia include no known factors. Pertinent negatives include no chest pain, focal sensory loss, focal weakness, leg pain, myalgias or shortness of breath. Current antihyperlipidemic treatment includes statins. The current treatment  provides moderate improvement of lipids. Compliance problems include adherence to exercise and adherence to diet.       Review of Systems  Constitutional: Positive for malaise/fatigue. Negative for fever, chills, weight loss, diaphoresis, activity change, appetite change, fatigue and unexpected weight change.  HENT: Negative.  Negative for neck pain.   Eyes: Negative.  Negative for blurred vision.  Respiratory: Negative for cough, shortness of breath, wheezing and stridor.   Cardiovascular: Negative for chest pain, palpitations, orthopnea, leg swelling and PND.  Gastrointestinal: Negative for nausea, vomiting, abdominal pain, diarrhea, constipation, melena, hematochezia, anorexia and hematemesis.  Genitourinary: Negative for dysuria, urgency, frequency, hematuria, flank pain, decreased urine volume, vaginal bleeding, enuresis, difficulty urinating and dyspareunia.  Musculoskeletal: Negative for myalgias, back pain, joint swelling, arthralgias and gait problem.  Skin: Negative for color change, pallor, rash and wound.  Neurological: Negative for dizziness, tremors, focal weakness, seizures, syncope, facial asymmetry, speech difficulty, weakness, light-headedness, numbness, headaches and paresthesias.  Hematological: Negative for adenopathy. Does not bruise/bleed easily.  Psychiatric/Behavioral: Negative for behavioral problems, confusion and agitation.       Objective:   Physical Exam  Vitals reviewed. Constitutional: She is oriented to person, place, and time. She appears well-developed and well-nourished. No distress.  HENT:  Head: Normocephalic and atraumatic.  Mouth/Throat: Oropharynx is clear and moist. No oropharyngeal exudate.  Eyes: Conjunctivae are normal. Right eye exhibits no discharge. Left eye exhibits no discharge. No scleral icterus.  Neck: Normal range of motion. Neck supple. No JVD present. No tracheal deviation present. No thyromegaly present.  Cardiovascular: Normal  rate, regular rhythm, normal heart sounds and intact distal pulses.  Exam reveals no gallop and no friction rub.   No murmur heard. Pulmonary/Chest: Effort normal and breath sounds normal. No stridor. No respiratory distress. She has no wheezes. She has no rales. She exhibits no tenderness.  Abdominal: Soft. Bowel sounds are normal. She exhibits no distension and no  mass. There is no tenderness. There is no rebound and no guarding.  Musculoskeletal: Normal range of motion. She exhibits no edema and no tenderness.  Lymphadenopathy:    She has no cervical adenopathy.  Neurological: She is oriented to person, place, and time.  Skin: Skin is warm and dry. No rash noted. She is not diaphoretic. No erythema. No pallor.  Psychiatric: She has a normal mood and affect. Her behavior is normal. Judgment and thought content normal.      Lab Results  Component Value Date   WBC 14.0* 04/30/2011   HGB 10.0* 04/30/2011   HCT 31.3* 04/30/2011   PLT 204 04/30/2011   GLUCOSE 103* 04/30/2011   CHOL 140 03/01/2010   TRIG 73.0 03/01/2010   HDL 47.10 03/01/2010   LDLCALC 78 03/01/2010   ALT 7 04/30/2011   AST 16 04/30/2011   NA 137 04/30/2011   K 4.2 04/30/2011   CL 105 04/30/2011   CREATININE 1.21* 04/30/2011   BUN 25* 04/30/2011   CO2 23 04/30/2011   TSH 2.07 03/01/2010     Assessment & Plan:

## 2011-06-05 NOTE — Assessment & Plan Note (Signed)
I will recheck her renal function today and see if it is safe for her to continue with diuretic med

## 2011-06-07 LAB — FERRITIN: Ferritin: 82.3 ng/mL (ref 10.0–291.0)

## 2011-06-09 ENCOUNTER — Encounter: Payer: Self-pay | Admitting: Internal Medicine

## 2011-06-10 LAB — PROTEIN ELECTROPHORESIS, SERUM
Albumin ELP: 49 % — ABNORMAL LOW (ref 55.8–66.1)
Alpha-1-Globulin: 5.5 % — ABNORMAL HIGH (ref 2.9–4.9)
Alpha-2-Globulin: 12 % — ABNORMAL HIGH (ref 7.1–11.8)
Beta 2: 5.9 % (ref 3.2–6.5)
Gamma Globulin: 21.5 % — ABNORMAL HIGH (ref 11.1–18.8)

## 2011-12-04 ENCOUNTER — Other Ambulatory Visit (INDEPENDENT_AMBULATORY_CARE_PROVIDER_SITE_OTHER): Payer: Medicare PPO

## 2011-12-04 ENCOUNTER — Encounter: Payer: Self-pay | Admitting: Internal Medicine

## 2011-12-04 ENCOUNTER — Ambulatory Visit (INDEPENDENT_AMBULATORY_CARE_PROVIDER_SITE_OTHER): Payer: Medicare PPO | Admitting: Internal Medicine

## 2011-12-04 VITALS — BP 132/70 | HR 67 | Temp 98.0°F | Resp 16 | Wt 243.0 lb

## 2011-12-04 DIAGNOSIS — D649 Anemia, unspecified: Secondary | ICD-10-CM

## 2011-12-04 DIAGNOSIS — I10 Essential (primary) hypertension: Secondary | ICD-10-CM

## 2011-12-04 DIAGNOSIS — I2510 Atherosclerotic heart disease of native coronary artery without angina pectoris: Secondary | ICD-10-CM

## 2011-12-04 DIAGNOSIS — N181 Chronic kidney disease, stage 1: Secondary | ICD-10-CM

## 2011-12-04 DIAGNOSIS — E785 Hyperlipidemia, unspecified: Secondary | ICD-10-CM

## 2011-12-04 LAB — COMPREHENSIVE METABOLIC PANEL WITH GFR
ALT: 13 U/L (ref 0–35)
AST: 19 U/L (ref 0–37)
Albumin: 3.5 g/dL (ref 3.5–5.2)
Alkaline Phosphatase: 76 U/L (ref 39–117)
BUN: 18 mg/dL (ref 6–23)
CO2: 24 meq/L (ref 19–32)
Calcium: 8.9 mg/dL (ref 8.4–10.5)
Chloride: 112 meq/L (ref 96–112)
Creatinine, Ser: 1 mg/dL (ref 0.4–1.2)
GFR: 71.9 mL/min (ref >60.00)
Glucose, Bld: 95 mg/dL (ref 70–99)
Potassium: 4.5 meq/L (ref 3.5–5.1)
Sodium: 142 meq/L (ref 135–145)
Total Bilirubin: 0.8 mg/dL (ref 0.3–1.2)
Total Protein: 7.2 g/dL (ref 6.0–8.3)

## 2011-12-04 LAB — HM PAP SMEAR

## 2011-12-04 LAB — CK: Total CK: 158 U/L (ref 7–177)

## 2011-12-04 LAB — LIPID PANEL
Cholesterol: 204 mg/dL — ABNORMAL HIGH (ref 0–200)
HDL: 50.6 mg/dL (ref >39.00)
Total CHOL/HDL Ratio: 4
Triglycerides: 104 mg/dL (ref 0.0–149.0)
VLDL: 20.8 mg/dL (ref 0.0–40.0)

## 2011-12-04 LAB — CBC WITH DIFFERENTIAL/PLATELET
Basophils Absolute: 0 K/uL (ref 0.0–0.1)
Basophils Relative: 0.2 % (ref 0.0–3.0)
Eosinophils Absolute: 0.3 K/uL (ref 0.0–0.7)
Eosinophils Relative: 3.7 % (ref 0.0–5.0)
HCT: 35.6 % — ABNORMAL LOW (ref 36.0–46.0)
Hemoglobin: 11.5 g/dL — ABNORMAL LOW (ref 12.0–15.0)
Lymphocytes Relative: 16.1 % (ref 12.0–46.0)
Lymphs Abs: 1.4 K/uL (ref 0.7–4.0)
MCHC: 32.3 g/dL (ref 30.0–36.0)
MCV: 91.2 fl (ref 78.0–100.0)
Monocytes Absolute: 0.8 K/uL (ref 0.1–1.0)
Monocytes Relative: 8.6 % (ref 3.0–12.0)
Neutro Abs: 6.4 K/uL (ref 1.4–7.7)
Neutrophils Relative %: 71.4 % (ref 43.0–77.0)
Platelets: 203 K/uL (ref 150.0–400.0)
RBC: 3.91 Mil/uL (ref 3.87–5.11)
RDW: 14.6 % (ref 11.5–14.6)
WBC: 9 K/uL (ref 4.5–10.5)

## 2011-12-04 LAB — URINALYSIS, ROUTINE W REFLEX MICROSCOPIC
Hgb urine dipstick: NEGATIVE
Ketones, ur: NEGATIVE
Urine Glucose: NEGATIVE
Urobilinogen, UA: 0.2 (ref 0.0–1.0)

## 2011-12-04 LAB — HM MAMMOGRAPHY

## 2011-12-04 LAB — LDL CHOLESTEROL, DIRECT: Direct LDL: 144.3 mg/dL

## 2011-12-04 MED ORDER — ROSUVASTATIN CALCIUM 10 MG PO TABS: 10.0000 mg | ORAL_TABLET | Freq: Every day | ORAL | Status: DC

## 2011-12-04 MED ORDER — VALSARTAN 160 MG PO TABS: 160.0000 mg | ORAL_TABLET | Freq: Every day | ORAL | Status: DC

## 2011-12-04 MED ORDER — FUROSEMIDE 20 MG PO TABS: 20.0000 mg | ORAL_TABLET | Freq: Every day | ORAL | Status: DC

## 2011-12-04 MED ORDER — METOPROLOL TARTRATE 50 MG PO TABS: 50.0000 mg | ORAL_TABLET | Freq: Two times a day (BID) | ORAL | Status: DC

## 2011-12-04 NOTE — Assessment & Plan Note (Signed)
She is doing well on crestor, I will check her FLP today 

## 2011-12-04 NOTE — Patient Instructions (Signed)

## 2011-12-04 NOTE — Assessment & Plan Note (Signed)
She needs an updated cardiology appt

## 2011-12-04 NOTE — Assessment & Plan Note (Signed)
I will recheck her CBC today 

## 2011-12-04 NOTE — Assessment & Plan Note (Signed)
I will check her renal function today 

## 2011-12-04 NOTE — Assessment & Plan Note (Signed)
Her BP is well controlled, I will check her lytes and renal function 

## 2011-12-04 NOTE — Progress Notes (Signed)
Subjective:    Patient ID: Natalie Goodman, female    DOB: 11-11-1924, 78 y.o.   MRN: [id number]  Hypertension This is a chronic problem. The current episode started more than 1 year ago. The problem has been gradually improving since onset. The problem is controlled. Associated symptoms include peripheral edema. Pertinent negatives include no anxiety, blurred vision, chest pain, headaches, malaise/fatigue, neck pain, orthopnea, palpitations, PND, shortness of breath or sweats. Past treatments include diuretics and angiotensin blockers. The current treatment provides significant improvement. Compliance problems include exercise and diet.  Hypertensive end-organ damage includes CAD/MI. There is no history of chronic renal disease.  Hyperlipidemia This is a chronic problem. The current episode started more than 1 year ago. The problem is controlled. Recent lipid tests were reviewed and are variable. Exacerbating diseases include obesity. She has no history of chronic renal disease, diabetes, hypothyroidism, liver disease or nephrotic syndrome. Factors aggravating her hyperlipidemia include fatty foods. Pertinent negatives include no chest pain, focal sensory loss, focal weakness, leg pain, myalgias or shortness of breath. The current treatment provides moderate improvement of lipids. Compliance problems include adherence to exercise and adherence to diet.       Review of Systems  Constitutional: Negative for fever, chills, malaise/fatigue, diaphoresis, activity change, appetite change, fatigue and unexpected weight change.  HENT: Negative.  Negative for neck pain.   Eyes: Negative.  Negative for blurred vision.  Respiratory: Negative for apnea, cough, chest tightness, shortness of breath, wheezing and stridor.   Cardiovascular: Negative for chest pain, palpitations, orthopnea, leg swelling and PND.  Gastrointestinal: Negative for nausea, vomiting, abdominal pain, diarrhea, constipation, blood in stool and  abdominal distention.  Genitourinary: Negative.   Musculoskeletal: Negative for myalgias, back pain, joint swelling, arthralgias and gait problem.  Skin: Negative for color change, pallor, rash and wound.  Neurological: Negative for dizziness, tremors, focal weakness, seizures, syncope, facial asymmetry, speech difficulty, weakness, light-headedness, numbness and headaches.  Hematological: Negative for adenopathy. Does not bruise/bleed easily.  Psychiatric/Behavioral: Negative.        Objective:   Physical Exam  Vitals reviewed. Constitutional: She is oriented to person, place, and time. She appears well-developed and well-nourished. No distress.  HENT:  Head: Normocephalic and atraumatic.  Mouth/Throat: Oropharynx is clear and moist. No oropharyngeal exudate.  Eyes: Conjunctivae are normal. Right eye exhibits no discharge. Left eye exhibits no discharge. No scleral icterus.  Neck: Normal range of motion. Neck supple. No JVD present. No tracheal deviation present. No thyromegaly present.  Cardiovascular: Normal rate, regular rhythm, normal heart sounds and intact distal pulses.  Exam reveals no gallop and no friction rub.   No murmur heard. Pulmonary/Chest: Effort normal and breath sounds normal. No stridor. No respiratory distress. She has no wheezes. She has no rales. She exhibits no tenderness.  Abdominal: Soft. Bowel sounds are normal. She exhibits no distension and no mass. There is no tenderness. There is no rebound and no guarding.  Musculoskeletal: Normal range of motion. She exhibits no edema and no tenderness.  Lymphadenopathy:    She has no cervical adenopathy.  Neurological: She is oriented to person, place, and time.  Skin: Skin is warm and dry. No rash noted. She is not diaphoretic. No erythema. No pallor.  Psychiatric: She has a normal mood and affect. Judgment and thought content normal. Her mood appears not anxious. Her affect is not angry, not blunt, not labile and not  inappropriate. Her speech is delayed and tangential. Her speech is not rapid and/or pressured and not  slurred. She is slowed. She is not agitated, not aggressive, is not hyperactive, not withdrawn, not actively hallucinating and not combative. Thought content is not paranoid and not delusional. Cognition and memory are impaired. She does not express impulsivity or inappropriate judgment. She does not exhibit a depressed mood. She expresses no homicidal and no suicidal ideation. She expresses no suicidal plans and no homicidal plans. She is communicative. She exhibits abnormal recent memory. She is inattentive.      Lab Results  Component Value Date   WBC 9.7 06/05/2011   HGB 11.2* 06/05/2011   HCT 34.4* 06/05/2011   PLT 215.0 06/05/2011   GLUCOSE 93 06/05/2011   CHOL 149 06/05/2011   TRIG 124.0 06/05/2011   HDL 49.00 06/05/2011   LDLCALC 75 06/05/2011   ALT 13 06/05/2011   AST 22 06/05/2011   NA 140 06/05/2011   K 4.6 06/05/2011   CL 107 06/05/2011   CREATININE 1.3* 06/05/2011   BUN 26* 06/05/2011   CO2 24 06/05/2011   TSH 2.07 03/01/2010      Assessment & Plan:

## 2011-12-31 ENCOUNTER — Encounter: Payer: Self-pay | Admitting: *Deleted

## 2012-01-02 ENCOUNTER — Ambulatory Visit: Payer: Medicare PPO | Admitting: Cardiology

## 2012-01-23 ENCOUNTER — Encounter: Payer: Self-pay | Admitting: Cardiology

## 2012-01-23 ENCOUNTER — Ambulatory Visit (INDEPENDENT_AMBULATORY_CARE_PROVIDER_SITE_OTHER): Payer: Medicare PPO | Admitting: Cardiology

## 2012-01-23 VITALS — BP 150/69 | HR 64 | Ht 65.0 in | Wt 239.0 lb

## 2012-01-23 DIAGNOSIS — I2510 Atherosclerotic heart disease of native coronary artery without angina pectoris: Secondary | ICD-10-CM

## 2012-01-23 DIAGNOSIS — I10 Essential (primary) hypertension: Secondary | ICD-10-CM

## 2012-01-23 DIAGNOSIS — E785 Hyperlipidemia, unspecified: Secondary | ICD-10-CM

## 2012-01-23 MED ORDER — ASPIRIN EC 325 MG PO TBEC: 325.0000 mg | DELAYED_RELEASE_TABLET | Freq: Every day | ORAL | Status: AC
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
## 2012-01-23 NOTE — Assessment & Plan Note (Signed)
Continue statin. 

## 2012-01-23 NOTE — Assessment & Plan Note (Signed)
Blood pressure controlled. Continue present medications. Potassium and renal function monitored by primary care. 

## 2012-01-23 NOTE — Assessment & Plan Note (Signed)
Continue aspirin and statin. Obtain records from Dr. Norris Cross office.

## 2012-01-23 NOTE — Patient Instructions (Addendum)
Your physician wants you to follow-up in: ONE YEAR WITH DR CRENSHAW You will receive a reminder letter in the mail two months in advance. If you don't receive a letter, please call our office to schedule the follow-up appointment.  

## 2012-02-07 ENCOUNTER — Telehealth: Payer: Self-pay | Admitting: *Deleted

## 2012-02-07 DIAGNOSIS — R609 Edema, unspecified: Secondary | ICD-10-CM

## 2012-02-07 DIAGNOSIS — I2510 Atherosclerotic heart disease of native coronary artery without angina pectoris: Secondary | ICD-10-CM

## 2012-02-07 NOTE — Telephone Encounter (Signed)
Records reviewed by dr Jens Som, he wants the pt to have a repeat myoview and echo. Will call and discuss with pt. Unable to reach pt or leave a message

## 2012-02-10 NOTE — Telephone Encounter (Signed)
Testing scheduled by [facility]

## 2012-02-13 ENCOUNTER — Other Ambulatory Visit (HOSPITAL_COMMUNITY): Payer: Medicare PPO

## 2012-02-20 ENCOUNTER — Ambulatory Visit (HOSPITAL_COMMUNITY): Payer: Medicare PPO | Attending: Cardiology | Admitting: Radiology

## 2012-02-20 DIAGNOSIS — R609 Edema, unspecified: Secondary | ICD-10-CM | POA: Insufficient documentation

## 2012-02-20 DIAGNOSIS — I059 Rheumatic mitral valve disease, unspecified: Secondary | ICD-10-CM | POA: Insufficient documentation

## 2012-02-20 DIAGNOSIS — N189 Chronic kidney disease, unspecified: Secondary | ICD-10-CM | POA: Insufficient documentation

## 2012-02-20 DIAGNOSIS — I10 Essential (primary) hypertension: Secondary | ICD-10-CM | POA: Insufficient documentation

## 2012-02-20 DIAGNOSIS — I252 Old myocardial infarction: Secondary | ICD-10-CM | POA: Insufficient documentation

## 2012-02-20 DIAGNOSIS — E785 Hyperlipidemia, unspecified: Secondary | ICD-10-CM | POA: Insufficient documentation

## 2012-02-20 DIAGNOSIS — I2510 Atherosclerotic heart disease of native coronary artery without angina pectoris: Secondary | ICD-10-CM | POA: Insufficient documentation

## 2012-02-20 DIAGNOSIS — E669 Obesity, unspecified: Secondary | ICD-10-CM | POA: Insufficient documentation

## 2012-02-20 DIAGNOSIS — I517 Cardiomegaly: Secondary | ICD-10-CM | POA: Insufficient documentation

## 2012-02-20 NOTE — Progress Notes (Signed)
Echocardiogram performed.  

## 2012-02-23 ENCOUNTER — Emergency Department (HOSPITAL_COMMUNITY)
Admission: EM | Admit: 2012-02-23 | Discharge: 2012-02-23 | Disposition: A | Discharge status: Home / Self Care | Payer: Medicare PPO | Attending: Emergency Medicine | Admitting: Emergency Medicine

## 2012-02-23 ENCOUNTER — Emergency Department (HOSPITAL_COMMUNITY): Payer: Medicare PPO

## 2012-02-23 ENCOUNTER — Encounter (HOSPITAL_COMMUNITY): Payer: Self-pay | Admitting: Emergency Medicine

## 2012-02-23 DIAGNOSIS — I2510 Atherosclerotic heart disease of native coronary artery without angina pectoris: Secondary | ICD-10-CM | POA: Insufficient documentation

## 2012-02-23 DIAGNOSIS — I252 Old myocardial infarction: Secondary | ICD-10-CM | POA: Insufficient documentation

## 2012-02-23 DIAGNOSIS — I129 Hypertensive chronic kidney disease with stage 1 through stage 4 chronic kidney disease, or unspecified chronic kidney disease: Secondary | ICD-10-CM | POA: Insufficient documentation

## 2012-02-23 DIAGNOSIS — E785 Hyperlipidemia, unspecified: Secondary | ICD-10-CM | POA: Insufficient documentation

## 2012-02-23 DIAGNOSIS — Z79899 Other long term (current) drug therapy: Secondary | ICD-10-CM | POA: Insufficient documentation

## 2012-02-23 DIAGNOSIS — Z7982 Long term (current) use of aspirin: Secondary | ICD-10-CM | POA: Insufficient documentation

## 2012-02-23 DIAGNOSIS — R197 Diarrhea, unspecified: Secondary | ICD-10-CM | POA: Insufficient documentation

## 2012-02-23 DIAGNOSIS — N181 Chronic kidney disease, stage 1: Secondary | ICD-10-CM | POA: Insufficient documentation

## 2012-02-23 DIAGNOSIS — R112 Nausea with vomiting, unspecified: Secondary | ICD-10-CM | POA: Insufficient documentation

## 2012-02-23 LAB — COMPREHENSIVE METABOLIC PANEL
ALT: 10 U/L (ref 0–35)
AST: 19 U/L (ref 0–37)
Albumin: 3.9 g/dL (ref 3.5–5.2)
Alkaline Phosphatase: 98 U/L (ref 39–117)
BUN: 25 mg/dL — ABNORMAL HIGH (ref 6–23)
Chloride: 105 mEq/L (ref 96–112)
Potassium: 4.4 mEq/L (ref 3.5–5.1)
Sodium: 141 mEq/L (ref 135–145)
Total Bilirubin: 0.9 mg/dL (ref 0.3–1.2)
Total Protein: 8.5 g/dL — ABNORMAL HIGH (ref 6.0–8.3)

## 2012-02-23 LAB — URINALYSIS, ROUTINE W REFLEX MICROSCOPIC
Bilirubin Urine: NEGATIVE
Glucose, UA: NEGATIVE mg/dL
Hgb urine dipstick: NEGATIVE
Ketones, ur: NEGATIVE mg/dL
Leukocytes, UA: NEGATIVE
Protein, ur: NEGATIVE mg/dL
pH: 6.5 (ref 5.0–8.0)

## 2012-02-23 LAB — CBC WITH DIFFERENTIAL/PLATELET
Basophils Relative: 0 % (ref 0–1)
Hemoglobin: 12.5 g/dL (ref 12.0–15.0)
Lymphs Abs: 1.4 10*3/uL (ref 0.7–4.0)
Monocytes Relative: 6 % (ref 3–12)
Neutro Abs: 13.9 10*3/uL — ABNORMAL HIGH (ref 1.7–7.7)
Neutrophils Relative %: 85 % — ABNORMAL HIGH (ref 43–77)
Platelets: 201 10*3/uL (ref 150–400)
RBC: 4.31 MIL/uL (ref 3.87–5.11)

## 2012-02-23 MED ORDER — ONDANSETRON 8 MG PO TBDP
8.0000 mg | ORAL_TABLET | Freq: Once | ORAL | Status: AC
  Administered 2012-02-23: 8 mg via ORAL
  Filled 2012-02-23: qty 1

## 2012-02-23 MED ORDER — ONDANSETRON HCL 4 MG/2ML IJ SOLN
4.0000 mg | Freq: Once | INTRAMUSCULAR | Status: DC
  Filled 2012-02-23: qty 2

## 2012-02-23 MED ORDER — SODIUM CHLORIDE 0.9 % IV BOLUS (SEPSIS): 500.0000 mL | Freq: Once | INTRAVENOUS | Status: DC

## 2012-02-23 MED ORDER — ONDANSETRON HCL 8 MG PO TABS: 8.0000 mg | ORAL_TABLET | Freq: Three times a day (TID) | ORAL | Status: AC | PRN

## 2012-02-23 NOTE — ED Notes (Signed)
Pt states woke from sleep around 0200 w/ emesis and diarrhea. States she felt ok when she went to bed but not excellent, ate cabbage and cornbread for supper. States she has vomited 7 times since 0200 and had diarrhea each time she was up. Feels weak and "terrible" but denies abdominal pain

## 2012-02-23 NOTE — ED Provider Notes (Signed)
History     CSN: [id number]  Arrival date & time 02/23/12  4098   First MD Initiated Contact with Patient 02/23/12 1126      Chief Complaint  Patient presents with  . Nausea  . Emesis    (Consider location/radiation/quality/duration/timing/severity/associated sxs/prior treatment) Patient is a 78 y.o. female presenting with vomiting. The history is provided by the patient.  Emesis  Associated symptoms include diarrhea. Pertinent negatives include no abdominal pain, no chills, no cough, no fever and no headaches.  pt c/o nvd onset last pm. Had eaten approximately 1 hour before, denies any known bad food ingestion or ill contacts. Pt estimated 6-7 episodes of both vomiting and diarrhea. Emesis clear or color of recently ingested liquids. Diarrhea loose to watery, not bloody. Did have intermittent abd cramping pain, no current abd pain. No gu c/o. No fever or chills. Prior abd surgery includes hysterectomy. Denies faintness or lightheadedness.   Past Medical History  Diagnosis Date  . Hypertension   . Hyperlipidemia   . CAD (coronary artery disease)   . MYOCARDIAL INFARCTION, HX OF   . GERD   . KIDNEY DISEASE, CHRONIC, STAGE I   . OSTEOARTHRITIS   . PARESTHESIA   . ESOPHAGEAL STRICTURE   . Anemia     Past Surgical History  Procedure Date  . Abdominal hysterectomy   . Revision total knee arthroplasty     Bilateral  . Tonsillectomy     Family History  Problem Relation Age of Onset  . Hypertension Mother   . Hypertension Father   . Alcohol abuse Neg Hx   . Cancer Neg Hx   . Stroke Neg Hx   . Heart disease Mother     CHF    History  Substance Use Topics  . Smoking status: Never Smoker   . Smokeless tobacco: Never Used  . Alcohol Use: No    OB History    Grav Para Term Preterm Abortions TAB SAB Ect Mult Living                  Review of Systems  Constitutional: Negative for fever and chills.  HENT: Negative for neck pain.   Eyes: Negative for redness.    Respiratory: Negative for cough and shortness of breath.   Cardiovascular: Negative for chest pain.  Gastrointestinal: Positive for vomiting and diarrhea. Negative for abdominal pain.  Genitourinary: Negative for dysuria and flank pain.  Musculoskeletal: Negative for back pain.  Skin: Negative for rash.  Neurological: Negative for headaches.  Hematological: Does not bruise/bleed easily.  Psychiatric/Behavioral: Negative for confusion.    Allergies  Codeine  Home Medications   Current Outpatient Rx  Name Route Sig Dispense Refill  . ASPIRIN 325 MG PO TABS Oral Take 325 mg by mouth daily.    . FUROSEMIDE 20 MG PO TABS Oral Take 1 tablet (20 mg total) by mouth daily. 90 tablet 3  . METOPROLOL TARTRATE 50 MG PO TABS Oral Take 1 tablet (50 mg total) by mouth 2 (two) times daily. 180 tablet 3  . ROSUVASTATIN CALCIUM 10 MG PO TABS Oral Take 1 tablet (10 mg total) by mouth daily. 90 tablet 3  . VALSARTAN 160 MG PO TABS Oral Take 1 tablet (160 mg total) by mouth daily. 90 tablet 3    BP 147/59  Pulse 82  Temp 98.4 F (36.9 C) (Oral)  SpO2 100%  Physical Exam  Nursing note and vitals reviewed. Constitutional: She is oriented to person, place, and time.  She appears well-developed and well-nourished. No distress.  Eyes: Conjunctivae are normal. No scleral icterus.  Neck: Neck supple. No tracheal deviation present.  Cardiovascular: Normal rate, regular rhythm, normal heart sounds and intact distal pulses.   Pulmonary/Chest: Effort normal and breath sounds normal. No respiratory distress.  Abdominal: Soft. Normal appearance and bowel sounds are normal. She exhibits no distension and no mass. There is no tenderness. There is no rebound and no guarding.       No incarc hernia  Genitourinary:       No cva tenderness  Musculoskeletal: She exhibits no tenderness.  Neurological: She is alert and oriented to person, place, and time.  Skin: Skin is warm and dry. No rash noted.  Psychiatric:  She has a normal mood and affect.    ED Course  Procedures (including critical care time)  Labs Reviewed  CBC WITH DIFFERENTIAL - Abnormal; Notable for the following:    WBC 16.3 (*)     Neutrophils Relative 85 (*)     Neutro Abs 13.9 (*)     Lymphocytes Relative 8 (*)     All other components within normal limits  COMPREHENSIVE METABOLIC PANEL  COMPREHENSIVE METABOLIC PANEL  CBC  URINALYSIS, ROUTINE W REFLEX MICROSCOPIC    Results for orders placed during the hospital encounter of 02/23/12  CBC WITH DIFFERENTIAL      Component Value Range   WBC 16.3 (*) 4.0 - 10.5 K/uL   RBC 4.31  3.87 - 5.11 MIL/uL   Hemoglobin 12.5  12.0 - 15.0 g/dL   HCT 16.1  09.6 - 04.5 %   MCV 93.3  78.0 - 100.0 fL   MCH 29.0  26.0 - 34.0 pg   MCHC 31.1  30.0 - 36.0 g/dL   RDW 40.9  81.1 - 91.4 %   Platelets 201  150 - 400 K/uL   Neutrophils Relative 85 (*) 43 - 77 %   Neutro Abs 13.9 (*) 1.7 - 7.7 K/uL   Lymphocytes Relative 8 (*) 12 - 46 %   Lymphs Abs 1.4  0.7 - 4.0 K/uL   Monocytes Relative 6  3 - 12 %   Monocytes Absolute 1.0  0.1 - 1.0 K/uL   Eosinophils Relative 0  0 - 5 %   Eosinophils Absolute 0.0  0.0 - 0.7 K/uL   Basophils Relative 0  0 - 1 %   Basophils Absolute 0.0  0.0 - 0.1 K/uL  COMPREHENSIVE METABOLIC PANEL      Component Value Range   Sodium 141  135 - 145 mEq/L   Potassium 4.4  3.5 - 5.1 mEq/L   Chloride 105  96 - 112 mEq/L   CO2 24  19 - 32 mEq/L   Glucose, Bld 126 (*) 70 - 99 mg/dL   BUN 25 (*) 6 - 23 mg/dL   Creatinine, Ser 7.82  0.50 - 1.10 mg/dL   Calcium 9.9  8.4 - 95.6 mg/dL   Total Protein 8.5 (*) 6.0 - 8.3 g/dL   Albumin 3.9  3.5 - 5.2 g/dL   AST 19  0 - 37 U/L   ALT 10  0 - 35 U/L   Alkaline Phosphatase 98  39 - 117 U/L   Total Bilirubin 0.9  0.3 - 1.2 mg/dL   GFR calc non Af Amer 45 (*) >90 mL/min   GFR calc Af Amer 53 (*) >90 mL/min  URINALYSIS, ROUTINE W REFLEX MICROSCOPIC      Component Value Range  Color, Urine YELLOW  YELLOW   APPearance CLOUDY  (*) CLEAR   Specific Gravity, Urine 1.019  1.005 - 1.030   pH 6.5  5.0 - 8.0   Glucose, UA NEGATIVE  NEGATIVE mg/dL   Hgb urine dipstick NEGATIVE  NEGATIVE   Bilirubin Urine NEGATIVE  NEGATIVE   Ketones, ur NEGATIVE  NEGATIVE mg/dL   Protein, ur NEGATIVE  NEGATIVE mg/dL   Urobilinogen, UA 0.2  0.0 - 1.0 mg/dL   Nitrite NEGATIVE  NEGATIVE   Leukocytes, UA NEGATIVE  NEGATIVE   Dg Abd 1 View  02/23/2012  *RADIOLOGY REPORT*  Clinical Data: Abdominal pain, vomiting, diarrhea  ABDOMEN - 1 VIEW  Comparison: 04/29/2011  Findings:  There is an overall paucity of bowel gas without definite evidence of obstruction.  No supine evidence of pneumoperitoneum.  No definite pneumatosis or portal venous gas.  No definite abnormal intra-abdominal calcifications.  Moderate to severe multilevel thoracolumbar spine degenerative change.  No acute osseous abnormalities.  IMPRESSION: Paucity of bowel gas without evidence of obstruction.  Original Report Authenticated By: Waynard Reeds, M.D.      MDM  Iv ns bolus. zofran iv.   Reviewed nursing notes and prior charts for additional history.    Recheck pt, no recurrent nvd. Symptoms improve/resolved. No abd pain. abd soft nt. Tolerating po fluids.          Suzi Roots, MD 02/23/12 [id number]

## 2012-02-25 ENCOUNTER — Ambulatory Visit (HOSPITAL_COMMUNITY): Payer: Medicare PPO | Attending: Cardiovascular Disease | Admitting: Radiology

## 2012-02-25 VITALS — BP 112/51 | Ht 65.0 in | Wt 240.0 lb

## 2012-02-25 DIAGNOSIS — R0989 Other specified symptoms and signs involving the circulatory and respiratory systems: Secondary | ICD-10-CM | POA: Insufficient documentation

## 2012-02-25 DIAGNOSIS — R197 Diarrhea, unspecified: Secondary | ICD-10-CM | POA: Insufficient documentation

## 2012-02-25 DIAGNOSIS — R0602 Shortness of breath: Secondary | ICD-10-CM

## 2012-02-25 DIAGNOSIS — R5383 Other fatigue: Secondary | ICD-10-CM | POA: Insufficient documentation

## 2012-02-25 DIAGNOSIS — R112 Nausea with vomiting, unspecified: Secondary | ICD-10-CM | POA: Insufficient documentation

## 2012-02-25 DIAGNOSIS — I2510 Atherosclerotic heart disease of native coronary artery without angina pectoris: Secondary | ICD-10-CM

## 2012-02-25 DIAGNOSIS — R0609 Other forms of dyspnea: Secondary | ICD-10-CM | POA: Insufficient documentation

## 2012-02-25 DIAGNOSIS — R609 Edema, unspecified: Secondary | ICD-10-CM

## 2012-02-25 DIAGNOSIS — R5381 Other malaise: Secondary | ICD-10-CM | POA: Insufficient documentation

## 2012-02-25 MED ORDER — TECHNETIUM TC 99M TETROFOSMIN IV KIT
33.0000 | PACK | Freq: Once | INTRAVENOUS | Status: AC | PRN
  Administered 2012-02-25: 33 via INTRAVENOUS

## 2012-02-25 MED ORDER — REGADENOSON 0.4 MG/5ML IV SOLN
0.4000 mg | Freq: Once | INTRAVENOUS | Status: AC
  Administered 2012-02-25: 0.4 mg via INTRAVENOUS

## 2012-02-25 NOTE — Progress Notes (Signed)
[facility] SITE 3 NUCLEAR MED [address] [place] [address] [phone]  Cardiology Nuclear Med Study  Natalie Goodman is a 78 y.o. female     MRN : [id number]     DOB: 07-24-1924  Procedure Date: 02/25/2012  Nuclear Med Background Indication for Stress Test:  Evaluation for Ischemia History:  MI 16 years ago per patient, 02-20-12 Echo: EF=50-55% Cardiac Risk Factors: Hypertension, Lipids and Obesity  Symptoms:  DOE, Fatigue with Exertion. Episode with nausea,vomiting, and diarrhea on 02-23-12 with ED visit   Nuclear Pre-Procedure Caffeine/Decaff Intake:  None > 12 hrs NPO After: 6:00pm   Lungs:  clear O2 Sat: 94 to 98% after deep breaths on room air. IV 0.9% NS with Angio Cath:  24g  IV Site: R Forearm  IV Started by:  Stanton Kidney, EMT-P  Chest Size (in):  38 Cup Size: C  Height: 5\' 5"  (1.651 m)  Weight:  240 lb (108.863 kg)  BMI:  Body mass index is 39.94 kg/(m^2). Tech Comments:  n/a    Nuclear Med Study 1 or 2 day study: 2 Day Stress Test Type:  Lexiscan  Reading MD: Tinnie Gens Keawe Marcello,M.D. Order Authorizing Provider:  Olga Millers, MD  Resting Radionuclide: Technetium 40m Tetrofosmin  Resting Radionuclide Dose: 32.9 mCi  On      03-03-12  Stress Radionuclide:  Technetium 63m Tetrofosmin  Stress Radionuclide Dose: 33.0 mCi  On        02-25-12          Stress Protocol Rest HR: 68 Stress HR: 82  Rest BP: 112/51 Stress BP: 121/51  Exercise Time (min): n/a METS: n/a   Predicted Max HR: 135 bpm % Max HR: 60.74 bpm Rate Pressure Product: 9922   Dose of Adenosine (mg):  n/a Dose of Lexiscan: 0.4 mg  Dose of Atropine (mg): n/a Dose of Dobutamine: n/a mcg/kg/min (at max HR)  Stress Test Technologist: Irean Hong, RN  Nuclear Technologist:  Leonia Corona, RT-N     Rest Procedure:  Myocardial perfusion imaging was performed at rest 45 minutes following the intravenous administration of Technetium 35m Tetrofosmin. Rest ECG: NSR with nonspecific T wave changes,  PVC  Stress Procedure:  The patient received IV Lexiscan 0.4 mg over 15-seconds.  Technetium 88m Tetrofosmin injected at 30-seconds.  There were no significant changes with Lexiscan.  Quantitative spect images were obtained after a 45 minute delay. Stress ECG: No significant ST segment change suggestive of ischemia.  QPS Raw Data Images:  Patient motion noted; appropriate software correction applied. Stress Images:  There is a medium size area of decreased uptake affecting all 3 segments of the inferior wall and inferolateral wall. The degree of photon reduction is moderate. Rest Images:  There is a small area of decreased uptake affecting all 3 segments of the inferolateral wall. The degree of photon reduction is mild. Subtraction (SDS):  There is ischemia in the inferior wall. Transient Ischemic Dilatation (Normal <1.22):  1.01 Lung/Heart Ratio (Normal <0.45):  0.44  Quantitative Gated Spect Images QGS EDV:  102 ml QGS ESV:  39 ml  Impression Exercise Capacity:  Lexiscan with no exercise. BP Response:  Normal blood pressure response. Clinical Symptoms:  No symptoms. ECG Impression:  No significant ST segment change suggestive of ischemia. Comparison with Prior Nuclear Study: No previous nuclear study performed  Overall Impression:  There is scar affecting the Inferior wall and the lateral wall. There is moderate ischemia in these areas also. This is a moderate risk  scan  LV Ejection Fraction: 61%.  LV Wall Motion:   Mild hypokinesis of the lateral wall.  Willa Rough, MD

## 2012-03-03 ENCOUNTER — Ambulatory Visit (HOSPITAL_COMMUNITY): Payer: Medicare PPO | Attending: Cardiology | Admitting: Radiology

## 2012-03-03 DIAGNOSIS — R0989 Other specified symptoms and signs involving the circulatory and respiratory systems: Secondary | ICD-10-CM

## 2012-03-03 MED ORDER — TECHNETIUM TC 99M TETROFOSMIN IV KIT
30.0000 | PACK | Freq: Once | INTRAVENOUS | Status: AC | PRN
  Administered 2012-03-03: 30 via INTRAVENOUS

## 2012-03-19 ENCOUNTER — Ambulatory Visit (INDEPENDENT_AMBULATORY_CARE_PROVIDER_SITE_OTHER): Payer: Medicare PPO | Admitting: Cardiology

## 2012-03-19 ENCOUNTER — Encounter: Payer: Self-pay | Admitting: Cardiology

## 2012-03-19 VITALS — BP 126/68 | HR 57 | Wt 239.0 lb

## 2012-03-19 DIAGNOSIS — I2510 Atherosclerotic heart disease of native coronary artery without angina pectoris: Secondary | ICD-10-CM

## 2012-03-19 DIAGNOSIS — E785 Hyperlipidemia, unspecified: Secondary | ICD-10-CM

## 2012-03-19 DIAGNOSIS — I10 Essential (primary) hypertension: Secondary | ICD-10-CM

## 2012-03-19 NOTE — Assessment & Plan Note (Signed)
Continue statin. 

## 2012-03-19 NOTE — Assessment & Plan Note (Signed)
Continue aspirin and statin. I reviewed her nuclear study with her today. I believe she has mild to moderate ischemia. It is in a single territory suggesting 1 vessel coronary disease that is significant. She is not having symptoms. I discussed the options of continued medical therapy versus cardiac catheterization. She would prefer conservative management and I think this is reasonable. We will reconsider cardiac catheterization if she develops symptoms in the future. Continue beta blocker.

## 2012-03-19 NOTE — Patient Instructions (Addendum)
Your physician wants you to follow-up in: 6 MONTHS WITH DR CRENSHAW You will receive a reminder letter in the mail two months in advance. If you don't receive a letter, please call our office to schedule the follow-up appointment.  

## 2012-03-19 NOTE — Progress Notes (Signed)
   HPI: Pleasant female for fu of coronary disease. I have no previous records available. She was previously seen by Dr. Mayford Knife. She apparently had a myocardial infarction in the past. No records available. Myoview performed in August of 2013 showed scar in the inferior and lateral wall with moderate ischemia in these areas as well as interpreted by Dr Myrtis Ser. Ejection fraction was 61%. Echocardiogram in August of 2013 showed an ejection fraction of 50-55%, mild left atrial enlargement and mild mitral regurgitation. Since I saw her previously she denies dyspnea on exertion, orthopnea, PND, pedal edema, chest pain or syncope.   Current Outpatient Prescriptions  Medication Sig Dispense Refill  . aspirin 325 MG tablet Take 325 mg by mouth daily.      . furosemide (LASIX) 20 MG tablet Take 1 tablet (20 mg total) by mouth daily.  90 tablet  3  . metoprolol (LOPRESSOR) 50 MG tablet Take 1 tablet (50 mg total) by mouth 2 (two) times daily.  180 tablet  3  . rosuvastatin (CRESTOR) 10 MG tablet Take 1 tablet (10 mg total) by mouth daily.  90 tablet  3  . valsartan (DIOVAN) 160 MG tablet Take 1 tablet (160 mg total) by mouth daily.  90 tablet  3     Past Medical History  Diagnosis Date  . Hypertension   . Hyperlipidemia   . CAD (coronary artery disease)   . MYOCARDIAL INFARCTION, HX OF   . GERD   . KIDNEY DISEASE, CHRONIC, STAGE I   . OSTEOARTHRITIS   . PARESTHESIA   . ESOPHAGEAL STRICTURE   . Anemia     Past Surgical History  Procedure Date  . Abdominal hysterectomy   . Revision total knee arthroplasty     Bilateral  . Tonsillectomy     History   Social History  . Marital Status: Widowed    Spouse Name: N/A    Number of Children: N/A  . Years of Education: N/A   Occupational History  . Not on file.   Social History Main Topics  . Smoking status: Never Smoker   . Smokeless tobacco: Never Used  . Alcohol Use: No  . Drug Use: No  . Sexually Active: Not Currently   Other Topics  Concern  . Not on file   Social History Narrative  . No narrative on file    ROS: no fevers or chills, productive cough, hemoptysis, dysphasia, odynophagia, melena, hematochezia, dysuria, hematuria, rash, seizure activity, orthopnea, PND, pedal edema, claudication. Remaining systems are negative.  Physical Exam: Well-developed obese in no acute distress.  Skin is warm and dry.  HEENT is normal.  Neck is supple.  Chest is clear to auscultation with normal expansion.  Cardiovascular exam is regular rate and rhythm.  Abdominal exam nontender or distended. No masses palpated. Extremities show no edema. neuro grossly intact

## 2012-03-19 NOTE — Assessment & Plan Note (Signed)
Blood pressure controlled. Continue present medications. 

## 2012-03-25 ENCOUNTER — Other Ambulatory Visit: Payer: Self-pay | Admitting: Cardiology

## 2012-03-25 DIAGNOSIS — I2510 Atherosclerotic heart disease of native coronary artery without angina pectoris: Secondary | ICD-10-CM

## 2012-03-25 DIAGNOSIS — E785 Hyperlipidemia, unspecified: Secondary | ICD-10-CM

## 2012-03-25 MED ORDER — METOPROLOL TARTRATE 50 MG PO TABS: 50.0000 mg | ORAL_TABLET | Freq: Two times a day (BID) | ORAL | Status: DC

## 2012-07-17 ENCOUNTER — Other Ambulatory Visit: Payer: Self-pay | Admitting: Internal Medicine

## 2012-11-12 ENCOUNTER — Other Ambulatory Visit: Payer: Self-pay | Admitting: Internal Medicine

## 2012-11-21 ENCOUNTER — Other Ambulatory Visit: Payer: Self-pay | Admitting: Internal Medicine

## 2012-11-30 ENCOUNTER — Other Ambulatory Visit: Payer: Self-pay | Admitting: Internal Medicine

## 2013-01-18 ENCOUNTER — Other Ambulatory Visit: Payer: Self-pay | Admitting: *Deleted

## 2013-01-18 DIAGNOSIS — E785 Hyperlipidemia, unspecified: Secondary | ICD-10-CM

## 2013-01-18 DIAGNOSIS — I2510 Atherosclerotic heart disease of native coronary artery without angina pectoris: Secondary | ICD-10-CM

## 2013-01-18 MED ORDER — METOPROLOL TARTRATE 50 MG PO TABS: 50.0000 mg | ORAL_TABLET | Freq: Two times a day (BID) | ORAL | Status: DC

## 2013-01-18 NOTE — Telephone Encounter (Signed)
Pt called stating she needed all her medications filled. Pt is aware of appointment date. Informed pt that Dr. Sanda Linger was on all of her medications but the Lopressor. Number provided to pt to call Dr. Yetta Barre office to fill those medications. Pt agreed. Fax Received. Refill Completed. Anneth Brunell Chowoe (R.M.A)

## 2013-02-15 ENCOUNTER — Encounter: Payer: Self-pay | Admitting: Cardiology

## 2013-02-15 ENCOUNTER — Ambulatory Visit (INDEPENDENT_AMBULATORY_CARE_PROVIDER_SITE_OTHER): Payer: Medicare PPO | Admitting: Cardiology

## 2013-02-15 VITALS — BP 139/55 | HR 69 | Wt 247.0 lb

## 2013-02-15 DIAGNOSIS — E785 Hyperlipidemia, unspecified: Secondary | ICD-10-CM

## 2013-02-15 DIAGNOSIS — I10 Essential (primary) hypertension: Secondary | ICD-10-CM

## 2013-02-15 DIAGNOSIS — I2510 Atherosclerotic heart disease of native coronary artery without angina pectoris: Secondary | ICD-10-CM

## 2013-02-15 DIAGNOSIS — N181 Chronic kidney disease, stage 1: Secondary | ICD-10-CM

## 2013-02-15 LAB — BASIC METABOLIC PANEL
BUN: 16 mg/dL (ref 6–23)
Chloride: 113 mEq/L — ABNORMAL HIGH (ref 96–112)
GFR: 54.76 mL/min — ABNORMAL LOW (ref >60.00)
Glucose, Bld: 93 mg/dL (ref 70–99)
Potassium: 4.1 mEq/L (ref 3.5–5.1)
Sodium: 143 mEq/L (ref 135–145)

## 2013-02-15 LAB — LIPID PANEL
HDL: 42.7 mg/dL (ref >39.00)
LDL Cholesterol: 126 mg/dL — ABNORMAL HIGH (ref 0–99)
VLDL: 24.8 mg/dL (ref 0.0–40.0)

## 2013-02-15 LAB — HEPATIC FUNCTION PANEL
ALT: 14 U/L (ref 0–35)
Bilirubin, Direct: 0 mg/dL (ref 0.0–0.3)
Total Bilirubin: 0.7 mg/dL (ref 0.3–1.2)

## 2013-02-15 MED ORDER — ROSUVASTATIN CALCIUM 10 MG PO TABS: 10.0000 mg | ORAL_TABLET | Freq: Every day | ORAL | Status: DC

## 2013-02-15 MED ORDER — METOPROLOL TARTRATE 50 MG PO TABS: 50.0000 mg | ORAL_TABLET | Freq: Two times a day (BID) | ORAL | Status: DC

## 2013-02-15 MED ORDER — VALSARTAN 160 MG PO TABS: 160.0000 mg | ORAL_TABLET | Freq: Every day | ORAL | Status: DC

## 2013-02-15 NOTE — Assessment & Plan Note (Signed)
Continue aspirin and statin. Begin discussed options today including medical therapy versus cardiac catheterization. She would like conservative measures and I think this is appropriate. She's not having dyspnea or chest pain. We will reconsider this if she develops symptoms. Continue beta blocker.

## 2013-02-15 NOTE — Assessment & Plan Note (Signed)
Check potassium and renal function. We will arrange a primary care physician.

## 2013-02-15 NOTE — Assessment & Plan Note (Signed)
Continue statin.check lipids and liver. 

## 2013-02-15 NOTE — Progress Notes (Signed)
   HPI: Pleasant female for fu of coronary disease. She was previously seen by Dr. Mayford Knife. She apparently had a myocardial infarction in the past. She does not recall stents were catheterization. Echocardiogram in August of 2013 showed an ejection fraction of 50-55%. There was mild left atrial enlargement and mild mitral regurgitation. Nuclear study in August of 2013 showed an ejection fraction of 61%. There was a scar in the inferior and lateral wall and moderate ischemia as well. I reviewed the images and felt the ischemia was mild to moderate. She was not having symptoms and we have elected to treat this medically. I last saw her in September of 2013. Since then, the patient denies any dyspnea on exertion, orthopnea, PND, palpitations, syncope or chest pain. Mild pedal edema.     Current Outpatient Prescriptions  Medication Sig Dispense Refill  . aspirin 325 MG tablet Take 325 mg by mouth daily.      . furosemide (LASIX) 20 MG tablet Take 1 tablet (20 mg total) by mouth daily.  90 tablet  3  . metoprolol (LOPRESSOR) 50 MG tablet Take 1 tablet (50 mg total) by mouth 2 (two) times daily.  180 tablet  0  . rosuvastatin (CRESTOR) 10 MG tablet Take 1 tablet (10 mg total) by mouth daily.  90 tablet  3  . valsartan (DIOVAN) 160 MG tablet Take 1 tablet (160 mg total) by mouth daily.  90 tablet  3   No current facility-administered medications for this visit.     Past Medical History  Diagnosis Date  . Hypertension   . Hyperlipidemia   . CAD (coronary artery disease)   . MYOCARDIAL INFARCTION, HX OF   . GERD   . KIDNEY DISEASE, CHRONIC, STAGE I   . OSTEOARTHRITIS   . PARESTHESIA   . ESOPHAGEAL STRICTURE   . Anemia     Past Surgical History  Procedure Laterality Date  . Abdominal hysterectomy    . Revision total knee arthroplasty      Bilateral  . Tonsillectomy      History   Social History  . Marital Status: Widowed    Spouse Name: N/A    Number of Children: N/A  . Years of  Education: N/A   Occupational History  . Not on file.   Social History Main Topics  . Smoking status: Never Smoker   . Smokeless tobacco: Never Used  . Alcohol Use: No  . Drug Use: No  . Sexually Active: Not Currently   Other Topics Concern  . Not on file   Social History Narrative  . No narrative on file    ROS: no fevers or chills, productive cough, hemoptysis, dysphasia, odynophagia, melena, hematochezia, dysuria, hematuria, rash, seizure activity, orthopnea, PND, claudication. Remaining systems are negative.  Physical Exam: Well-developed obese in no acute distress.  Skin is warm and dry.  HEENT is normal.  Neck is supple.  Chest is clear to auscultation with normal expansion.  Cardiovascular exam is regular rate and rhythm.  Abdominal exam nontender or distended. No masses palpated. Extremities show 1+ edema. neuro grossly intact  ECG sinus rhythm at a rate of 69. Prior inferior lateral infarct.

## 2013-02-15 NOTE — Patient Instructions (Signed)
Your physician wants you to follow-up in: ONE YEAR WITH DR Shelda Pal will receive a reminder letter in the mail two months in advance. If you don't receive a letter, please call our office to schedule the follow-up appointment.   Your physician recommends that you HAVE LAB WORK TODAY  REFERRAL TO PCP

## 2013-02-15 NOTE — Assessment & Plan Note (Signed)
Continue present blood pressure medications. Check potassium and renal function. 

## 2013-02-16 ENCOUNTER — Other Ambulatory Visit: Payer: Self-pay | Admitting: Internal Medicine

## 2013-02-18 ENCOUNTER — Encounter: Payer: Self-pay | Admitting: *Deleted

## 2013-02-22 ENCOUNTER — Other Ambulatory Visit: Payer: Self-pay | Admitting: *Deleted

## 2013-02-22 DIAGNOSIS — I10 Essential (primary) hypertension: Secondary | ICD-10-CM

## 2013-02-22 MED ORDER — FUROSEMIDE 20 MG PO TABS: 20.0000 mg | ORAL_TABLET | Freq: Every day | ORAL | Status: DC

## 2013-03-01 ENCOUNTER — Telehealth: Payer: Self-pay | Admitting: Cardiology

## 2013-03-01 ENCOUNTER — Ambulatory Visit: Payer: Medicare PPO

## 2013-03-01 NOTE — Telephone Encounter (Signed)
New problem   [facility] need a medication review. Pt only has two meds and she think that pt should be on four. Please call Annabelle Harman.

## 2013-03-01 NOTE — Telephone Encounter (Signed)
Left message for dana to call if needed. Detailed med list left on her voice mail.

## 2013-03-10 ENCOUNTER — Ambulatory Visit (INDEPENDENT_AMBULATORY_CARE_PROVIDER_SITE_OTHER): Payer: Medicare PPO | Admitting: Internal Medicine

## 2013-03-10 ENCOUNTER — Encounter: Payer: Self-pay | Admitting: Internal Medicine

## 2013-03-10 ENCOUNTER — Other Ambulatory Visit (INDEPENDENT_AMBULATORY_CARE_PROVIDER_SITE_OTHER): Payer: Medicare PPO

## 2013-03-10 VITALS — BP 136/70 | HR 61 | Temp 98.5°F | Resp 16 | Wt 236.1 lb

## 2013-03-10 DIAGNOSIS — E669 Obesity, unspecified: Secondary | ICD-10-CM | POA: Insufficient documentation

## 2013-03-10 DIAGNOSIS — I10 Essential (primary) hypertension: Secondary | ICD-10-CM

## 2013-03-10 DIAGNOSIS — D72829 Elevated white blood cell count, unspecified: Secondary | ICD-10-CM

## 2013-03-10 DIAGNOSIS — N181 Chronic kidney disease, stage 1: Secondary | ICD-10-CM

## 2013-03-10 DIAGNOSIS — Z23 Encounter for immunization: Secondary | ICD-10-CM

## 2013-03-10 DIAGNOSIS — E785 Hyperlipidemia, unspecified: Secondary | ICD-10-CM

## 2013-03-10 LAB — HM DEXA SCAN

## 2013-03-10 LAB — CBC WITH DIFFERENTIAL/PLATELET
Basophils Relative: 0.2 % (ref 0.0–3.0)
HCT: 37.8 % (ref 36.0–46.0)
Hemoglobin: 12.4 g/dL (ref 12.0–15.0)
Lymphocytes Relative: 13.6 % (ref 12.0–46.0)
Lymphs Abs: 1.7 10*3/uL (ref 0.7–4.0)
Monocytes Relative: 10.9 % (ref 3.0–12.0)
Neutro Abs: 8.9 10*3/uL — ABNORMAL HIGH (ref 1.4–7.7)
RBC: 4.18 Mil/uL (ref 3.87–5.11)

## 2013-03-10 MED ORDER — EZETIMIBE 10 MG PO TABS: 10.0000 mg | ORAL_TABLET | Freq: Every day | ORAL | Status: DC

## 2013-03-10 NOTE — Assessment & Plan Note (Signed)
Her BP is well controlled 

## 2013-03-10 NOTE — Assessment & Plan Note (Signed)
She agrees to work on her lifestyle modifications to lose weight. 

## 2013-03-10 NOTE — Assessment & Plan Note (Signed)
I will recheck her CBC today as well as an SPEP to see if she has lymphoproliferative disease

## 2013-03-10 NOTE — Progress Notes (Signed)
Subjective:    Patient ID: Natalie Goodman, female    DOB: [birth year], 79 y.o.   MRN: [id number]  Hypertension This is a chronic problem. The current episode started more than 1 year ago. The problem is unchanged. The problem is controlled. Pertinent negatives include no anxiety, blurred vision, chest pain, headaches, malaise/fatigue, neck pain, orthopnea, palpitations, peripheral edema, PND, shortness of breath or sweats. Risk factors for coronary artery disease include obesity. Past treatments include diuretics, beta blockers and angiotensin blockers. Compliance problems include exercise and diet.  Hypertensive end-organ damage includes CAD/MI.      Review of Systems  Constitutional: Negative.  Negative for fever, chills, malaise/fatigue, diaphoresis, appetite change and fatigue.  HENT: Negative.  Negative for neck pain.   Eyes: Negative.  Negative for blurred vision.  Respiratory: Negative.  Negative for cough, choking, chest tightness, shortness of breath and stridor.   Cardiovascular: Negative.  Negative for chest pain, palpitations, orthopnea, leg swelling and PND.  Gastrointestinal: Negative.  Negative for nausea, vomiting, abdominal pain, diarrhea and constipation.  Endocrine: Negative.   Genitourinary: Negative.   Musculoskeletal: Negative.   Skin: Negative.   Allergic/Immunologic: Negative.   Neurological: Negative.  Negative for dizziness, tremors, speech difficulty, weakness, light-headedness and headaches.  Hematological: Negative.  Negative for adenopathy. Does not bruise/bleed easily.  Psychiatric/Behavioral: Negative.        Objective:   Physical Exam  Vitals reviewed. Constitutional: She is oriented to person, place, and time. She appears well-developed and well-nourished. No distress.  HENT:  Head: Normocephalic and atraumatic.  Mouth/Throat: Oropharynx is clear and moist. No oropharyngeal exudate.  Eyes: Conjunctivae are normal. Right eye exhibits no discharge. Left  eye exhibits no discharge. No scleral icterus.  Neck: Normal range of motion. Neck supple. No JVD present. No tracheal deviation present. No thyromegaly present.  Cardiovascular: Normal rate, regular rhythm, S1 normal, S2 normal and intact distal pulses.  Exam reveals no gallop and no friction rub.   Murmur heard.  Decrescendo systolic murmur is present with a grade of 1/6   No diastolic murmur is present  Pulmonary/Chest: Effort normal and breath sounds normal. No stridor. No respiratory distress. She has no wheezes. She has no rales. She exhibits no tenderness.  Abdominal: Soft. Bowel sounds are normal. She exhibits no distension and no mass. There is no tenderness. There is no rebound and no guarding.  Musculoskeletal: Normal range of motion. She exhibits no edema and no tenderness.  Lymphadenopathy:    She has no cervical adenopathy.  Neurological: She is oriented to person, place, and time.  Skin: Skin is warm and dry. No rash noted. She is not diaphoretic. No erythema. No pallor.  Psychiatric: She has a normal mood and affect. Judgment and thought content normal. Her mood appears not anxious. Her affect is not angry, not blunt, not labile and not inappropriate. Her speech is delayed and tangential. She is slowed. She is not withdrawn and not actively hallucinating. Cognition and memory are impaired. She does not exhibit a depressed mood. She exhibits abnormal recent memory and abnormal remote memory. She is inattentive.     Lab Results  Component Value Date   WBC 16.3* 02/23/2012   HGB 12.5 02/23/2012   HCT 40.2 02/23/2012   PLT 201 02/23/2012   GLUCOSE 93 02/15/2013   CHOL 193 02/15/2013   TRIG 124.0 02/15/2013   HDL 42.70 02/15/2013   LDLDIRECT 144.3 12/04/2011   LDLCALC 126* 02/15/2013   ALT 14 02/15/2013   AST 22 02/15/2013  NA 143 02/15/2013   K 4.1 02/15/2013   CL 113* 02/15/2013   CREATININE 1.2 02/15/2013   BUN 16 02/15/2013   CO2 25 02/15/2013   TSH 2.38 12/04/2011       Assessment & Plan:

## 2013-03-10 NOTE — Patient Instructions (Signed)

## 2013-03-10 NOTE — Assessment & Plan Note (Signed)
This is stable 

## 2013-03-10 NOTE — Assessment & Plan Note (Signed)
Her LDL is not at goal Will continue crestor and will add zetia

## 2013-03-12 LAB — PROTEIN ELECTROPHORESIS, SERUM
Albumin ELP: 49.7 % — ABNORMAL LOW (ref 55.8–66.1)
Alpha-1-Globulin: 4.5 % (ref 2.9–4.9)
Beta Globulin: 6.2 % (ref 4.7–7.2)
Total Protein, Serum Electrophoresis: 8.1 g/dL (ref 6.0–8.3)

## 2013-04-01 ENCOUNTER — Encounter: Payer: Self-pay | Admitting: Internal Medicine

## 2013-04-01 ENCOUNTER — Ambulatory Visit (INDEPENDENT_AMBULATORY_CARE_PROVIDER_SITE_OTHER): Payer: Medicare PPO | Admitting: Internal Medicine

## 2013-04-01 ENCOUNTER — Other Ambulatory Visit (INDEPENDENT_AMBULATORY_CARE_PROVIDER_SITE_OTHER): Payer: Medicare PPO

## 2013-04-01 VITALS — BP 116/64 | HR 60 | Temp 98.3°F | Resp 16 | Wt 237.0 lb

## 2013-04-01 DIAGNOSIS — L299 Pruritus, unspecified: Secondary | ICD-10-CM

## 2013-04-01 DIAGNOSIS — L301 Dyshidrosis [pompholyx]: Secondary | ICD-10-CM | POA: Insufficient documentation

## 2013-04-01 DIAGNOSIS — D72829 Elevated white blood cell count, unspecified: Secondary | ICD-10-CM

## 2013-04-01 DIAGNOSIS — N181 Chronic kidney disease, stage 1: Secondary | ICD-10-CM

## 2013-04-01 LAB — CBC WITH DIFFERENTIAL/PLATELET
Basophils Relative: 0.4 % (ref 0.0–3.0)
Eosinophils Relative: 5.3 % — ABNORMAL HIGH (ref 0.0–5.0)
Lymphocytes Relative: 15.7 % (ref 12.0–46.0)
Monocytes Relative: 11.7 % (ref 3.0–12.0)
Neutrophils Relative %: 66.9 % (ref 43.0–77.0)
Platelets: 213 10*3/uL (ref 150.0–400.0)
RBC: 3.83 Mil/uL — ABNORMAL LOW (ref 3.87–5.11)
WBC: 9.4 10*3/uL (ref 4.5–10.5)

## 2013-04-01 LAB — SEDIMENTATION RATE: Sed Rate: 47 mm/hr — ABNORMAL HIGH (ref 0–22)

## 2013-04-01 MED ORDER — HYDROXYZINE HCL 10 MG PO TABS: 10.0000 mg | ORAL_TABLET | Freq: Three times a day (TID) | ORAL | Status: DC | PRN

## 2013-04-01 NOTE — Progress Notes (Signed)
  Subjective:    Patient ID: Natalie Goodman, female    DOB: Apr 18, 1925, 79 y.o.   MRN: [id number]  HPI  She complains of a 2 week history of diffuse itching but no rash. Her last labs showed an elevated WBC but her liver enzymes were normal.  Review of Systems  Constitutional: Negative for fever, chills, diaphoresis, appetite change and fatigue.  HENT: Negative.  Negative for sore throat, facial swelling, trouble swallowing and sinus pressure.   Eyes: Negative.   Respiratory: Negative.  Negative for cough, chest tightness, shortness of breath, wheezing and stridor.   Cardiovascular: Negative for chest pain, palpitations and leg swelling.  Gastrointestinal: Negative.  Negative for nausea, vomiting, abdominal pain, diarrhea, constipation and abdominal distention.  Endocrine: Negative.   Genitourinary: Negative.   Musculoskeletal: Negative.   Skin: Negative.   Allergic/Immunologic: Negative.  Negative for environmental allergies, food allergies and immunocompromised state.  Neurological: Negative.   Hematological: Negative.  Negative for adenopathy. Does not bruise/bleed easily.  Psychiatric/Behavioral: Negative.        Objective:   Physical Exam  Vitals reviewed. Constitutional: She is oriented to person, place, and time. She appears well-developed and well-nourished. No distress.  HENT:  Head: Normocephalic and atraumatic.  Mouth/Throat: Oropharynx is clear and moist. No oropharyngeal exudate.  Eyes: Conjunctivae are normal. Right eye exhibits no discharge. Left eye exhibits no discharge. No scleral icterus.  Neck: Normal range of motion. Neck supple. No JVD present. No tracheal deviation present. No thyromegaly present.  Cardiovascular: Normal rate, regular rhythm, normal heart sounds and intact distal pulses.  Exam reveals no gallop and no friction rub.   No murmur heard. Pulmonary/Chest: Effort normal and breath sounds normal. No stridor. No respiratory distress. She has no wheezes.  She has no rales. She exhibits no tenderness.  Abdominal: Soft. Bowel sounds are normal. She exhibits no distension and no mass. There is no tenderness. There is no rebound and no guarding.  Musculoskeletal: Normal range of motion. She exhibits no edema and no tenderness.  Lymphadenopathy:    She has no cervical adenopathy.  Neurological: She is oriented to person, place, and time.  Skin: Skin is warm and dry. No abrasion, no bruising, no burn, no ecchymosis, no laceration, no lesion, no petechiae, no purpura and no rash noted. Rash is not macular, not papular, not maculopapular, not nodular, not pustular, not vesicular and not urticarial. She is not diaphoretic. No cyanosis or erythema. No pallor. Nails show no clubbing.     Lab Results  Component Value Date   WBC 12.3* 03/10/2013   HGB 12.4 03/10/2013   HCT 37.8 03/10/2013   PLT 262.0 03/10/2013   GLUCOSE 93 02/15/2013   CHOL 193 02/15/2013   TRIG 124.0 02/15/2013   HDL 42.70 02/15/2013   LDLDIRECT 144.3 12/04/2011   LDLCALC 126* 02/15/2013   ALT 14 02/15/2013   AST 22 02/15/2013   NA 143 02/15/2013   K 4.1 02/15/2013   CL 113* 02/15/2013   CREATININE 1.2 02/15/2013   BUN 16 02/15/2013   CO2 25 02/15/2013   TSH 2.64 03/10/2013        Assessment & Plan:

## 2013-04-01 NOTE — Patient Instructions (Signed)
Itching Itching is a symptom that can be caused by many things. These include skin problems (including infections) as well as some internal diseases.  If the itching is affecting just one area of the body, it is most likely due to a common skin problem, such as:  Poison oak and poison ivy.  Contact dermatitis (skin irritation from a plant, chemicals, fiberglass, detergents, new cosmetic, new jewelry, or other substance).  Fungus (such as athlete's foot, jock itch, or ringworm).  Head lice  Dandruff  Insect bite  Infection (such as Shingles or other virus infections). If the itching is all over (widespread), the possible causes are many. These include:   Dry skin or eczema  Heat rash  Hives  Liver disorders  Kidney disorders TREATMENT  Localized itching   Lubrication of the skin. Use an ointment or cream or other unperfumed moisturizers if the skin is dry. Apply frequently, especially after bathing.  Anti-itch medicines. These medications may help control the urge to scratch. Scratching always makes itching worse and increases the chance of getting an infection.  Cortisone creams and ointments. These help reduce the inflammation.  Antibiotics. Skin infections can cause itching. Topical or oral antibiotics may be needed for 10 to 20 days to get rid of an infection. If you can identify what caused the itching, avoid this substance in the future.  Widespread itching  The following measures may help to relieve itching regardless of the cause:   Wash the skin once with soap to remove irritants.  Bathe in tepid water with baking soda, cornstarch, or oatmeal.  Use calamine lotion (nonprescription) or a baking soda solution (1 teaspoon in 4 ounces of water on the skin).  Apply 1% hydrocortisone cream (no prescription needed). Do not use this if there might be a skin infection.  Avoid scratching.  Avoid itchy or tight-fitting clothes.  Avoid excessive heat, sweating,  scented soaps, and swimming pools.  The lubricants, anti-itch medicines, etc. noted above may be helpful for controlling symptoms. SEEK MEDICAL CARE IF:   The itching becomes severe.  Your itch is not better after 1 week of treatment. Contact your caregiver to schedule further evaluation. Document Released: 07/01/2005 Document Revised: 09/23/2011 Document Reviewed: 12/19/2006 [employer] Patient Information 2014 [employer].  

## 2013-04-02 ENCOUNTER — Encounter: Payer: Self-pay | Admitting: Internal Medicine

## 2013-04-02 NOTE — Assessment & Plan Note (Signed)
Her BP is well controlled and her renal function is stable

## 2013-04-02 NOTE — Assessment & Plan Note (Signed)
Recent labs show no concerns regarding hepatic or renal causes for this I will check her CBC today to see if there is a bone marrow issue that may be causing this IN the meantime, she will take hydroxyzine as needed

## 2013-04-02 NOTE — Assessment & Plan Note (Signed)
I will recheck her CBC and will also look at her ESR and SPEP to see if she has developed a lymphoproliferative disease

## 2013-04-05 ENCOUNTER — Telehealth: Payer: Self-pay | Admitting: Oncology

## 2013-04-05 ENCOUNTER — Other Ambulatory Visit: Payer: Self-pay | Admitting: Internal Medicine

## 2013-04-05 DIAGNOSIS — D72829 Elevated white blood cell count, unspecified: Secondary | ICD-10-CM

## 2013-04-05 LAB — PROTEIN ELECTROPHORESIS, SERUM
Albumin ELP: 48.3 % — ABNORMAL LOW (ref 55.8–66.1)
Alpha-2-Globulin: 11.1 % (ref 7.1–11.8)
Beta Globulin: 6.5 % (ref 4.7–7.2)
Total Protein, Serum Electrophoresis: 7.3 g/dL (ref 6.0–8.3)

## 2013-04-05 NOTE — Telephone Encounter (Signed)
S/w pt and gve NP appt 10/07 @ 1:30 w/Dr. Clelia Croft Referring Dr. Sanda Linger Dx- Leukocytosis Welcome packet mailed.

## 2013-04-06 ENCOUNTER — Telehealth: Payer: Self-pay | Admitting: Oncology

## 2013-04-06 NOTE — Telephone Encounter (Signed)
C/D 04/06/13 for appt. 10/7

## 2013-04-16 ENCOUNTER — Other Ambulatory Visit: Payer: Self-pay | Admitting: Oncology

## 2013-04-16 DIAGNOSIS — D72829 Elevated white blood cell count, unspecified: Secondary | ICD-10-CM

## 2013-04-20 ENCOUNTER — Ambulatory Visit (HOSPITAL_BASED_OUTPATIENT_CLINIC_OR_DEPARTMENT_OTHER): Payer: Medicare PPO | Admitting: Oncology

## 2013-04-20 ENCOUNTER — Encounter: Payer: Self-pay | Admitting: Oncology

## 2013-04-20 ENCOUNTER — Other Ambulatory Visit (HOSPITAL_BASED_OUTPATIENT_CLINIC_OR_DEPARTMENT_OTHER): Payer: Medicare PPO | Admitting: Lab

## 2013-04-20 ENCOUNTER — Ambulatory Visit: Payer: Commercial Managed Care - HMO

## 2013-04-20 VITALS — BP 139/61 | HR 63 | Temp 97.2°F | Resp 20 | Ht 65.0 in | Wt 238.1 lb

## 2013-04-20 DIAGNOSIS — D72829 Elevated white blood cell count, unspecified: Secondary | ICD-10-CM

## 2013-04-20 DIAGNOSIS — L299 Pruritus, unspecified: Secondary | ICD-10-CM

## 2013-04-20 LAB — CBC WITH DIFFERENTIAL/PLATELET
BASO%: 0.4 % (ref 0.0–2.0)
Basophils Absolute: 0 10*3/uL (ref 0.0–0.1)
EOS%: 7.1 % — ABNORMAL HIGH (ref 0.0–7.0)
Eosinophils Absolute: 0.7 10*3/uL — ABNORMAL HIGH (ref 0.0–0.5)
HCT: 34 % — ABNORMAL LOW (ref 34.8–46.6)
HGB: 11 g/dL — ABNORMAL LOW (ref 11.6–15.9)
MCH: 29.8 pg (ref 25.1–34.0)
MCHC: 32.4 g/dL (ref 31.5–36.0)
MCV: 91.9 fL (ref 79.5–101.0)
MONO%: 10.6 % (ref 0.0–14.0)
NEUT%: 65.6 % (ref 38.4–76.8)
RDW: 14.5 % (ref 11.2–14.5)
WBC: 10.5 10*3/uL — ABNORMAL HIGH (ref 3.9–10.3)

## 2013-04-20 LAB — COMPREHENSIVE METABOLIC PANEL (CC13)
AST: 19 U/L (ref 5–34)
Alkaline Phosphatase: 82 U/L (ref 40–150)
BUN: 36 mg/dL — ABNORMAL HIGH (ref 7.0–26.0)
Calcium: 9.7 mg/dL (ref 8.4–10.4)
Chloride: 110 mEq/L — ABNORMAL HIGH (ref 98–109)
Creatinine: 1.4 mg/dL — ABNORMAL HIGH (ref 0.6–1.1)
Glucose: 98 mg/dl (ref 70–140)
Potassium: 4.4 mEq/L (ref 3.5–5.1)

## 2013-04-20 NOTE — Consult Note (Signed)
Reason for Referral: Leukocytosis and eosinophilia.   HPI: This is a pleasant 79 year old woman currently of [place]. She has a past medical history significant for hypertension and coronary artery disease as well as of obesity. She was in her normal state of health with her she presented with symptoms of itching for the last few weeks. She noticed it after she have received both the flu and the pneumonia vaccination. She has not reported any rash but does report itching predominantly on her right arm as well as back. She have been prescribed Atarax with some relief but continue to have generalized itching. Her workup revealed leukocytosis with a white cell counts of 12.3 that decreased to 9.4 on 04/01/2013. She did have an increase in eosinophils percentage of 5.3 but no lymphocytosis. She also had an elevated sedimentation rate of 47. Patient referred to me for evaluation of a lymphoproliferative disorder.  Clinically, she is otherwise asymptomatic. She does not report any fevers chills or sweats. She does not report any weight loss or appetite changes. She still performs activity of daily living without any decline. She does not drive and her nephew drove her today. But she lives independently and continue to do so. She did not report any diffuse lymphadenopathy she does not report any constitutional symptoms. As that report any chest pain or shortness of breath or difficulty breathing. She did not report any GI or GU symptoms of bleeding.   Past Medical History  Diagnosis Date  . Hypertension   . Hyperlipidemia   . CAD (coronary artery disease)   . MYOCARDIAL INFARCTION, HX OF   . GERD   . KIDNEY DISEASE, CHRONIC, STAGE I   . OSTEOARTHRITIS   . PARESTHESIA   . ESOPHAGEAL STRICTURE   . Anemia   :  Past Surgical History  Procedure Laterality Date  . Abdominal hysterectomy    . Revision total knee arthroplasty      Bilateral  . Tonsillectomy    :  Current Outpatient Prescriptions   Medication Sig Dispense Refill  . aspirin 325 MG tablet Take 325 mg by mouth daily.      Marland Kitchen ezetimibe (ZETIA) 10 MG tablet Take 1 tablet (10 mg total) by mouth daily.  90 tablet  3  . furosemide (LASIX) 20 MG tablet Take 1 tablet (20 mg total) by mouth daily.  90 tablet  1  . hydrOXYzine (ATARAX/VISTARIL) 10 MG tablet Take 1 tablet (10 mg total) by mouth 3 (three) times daily as needed for itching.  50 tablet  1  . metoprolol (LOPRESSOR) 50 MG tablet Take 1 tablet (50 mg total) by mouth 2 (two) times daily.  180 tablet  0  . rosuvastatin (CRESTOR) 10 MG tablet Take 1 tablet (10 mg total) by mouth daily.  90 tablet  3  . valsartan (DIOVAN) 160 MG tablet Take 1 tablet (160 mg total) by mouth daily.  90 tablet  3   No current facility-administered medications for this visit.       Allergies  Allergen Reactions  . Codeine Nausea And Vomiting  :  Family History  Problem Relation Age of Onset  . Hypertension Mother   . Hypertension Father   . Alcohol abuse Neg Hx   . Cancer Neg Hx   . Stroke Neg Hx   . Heart disease Mother     CHF  :  History   Social History  . Marital Status: Widowed    Spouse Name: N/A    Number of  Children: N/A  . Years of Education: N/A   Occupational History  . Not on file.   Social History Main Topics  . Smoking status: Never Smoker   . Smokeless tobacco: Never Used  . Alcohol Use: No  . Drug Use: No  . Sexual Activity: Not Currently   Other Topics Concern  . Not on file   Social History Narrative  . No narrative on file  :  Pertinent items are noted in HPI.  Exam: Blood pressure 139/61, pulse 63, temperature 97.2 F (36.2 C), temperature source Oral, resp. rate 20, height 5\' 5"  (1.651 m), weight 238 lb 1.6 oz (108.001 kg). General appearance: alert, cooperative and appears stated age Head: Normocephalic, without obvious abnormality, atraumatic Nose: Nares normal. Septum midline. Mucosa normal. No drainage or sinus tenderness. Throat:  lips, mucosa, and tongue normal; teeth and gums normal Back: symmetric, no curvature. ROM normal. No CVA tenderness. Resp: clear to auscultation bilaterally Cardio: regular rate and rhythm, S1, S2 normal, no murmur, click, rub or gallop GI: soft, non-tender; bowel sounds normal; no masses,  no organomegaly Extremities: extremities normal, atraumatic, no cyanosis or edema Pulses: 2+ and symmetric Skin: Skin color, texture, turgor normal. No rashes or lesions Lymph nodes: Cervical, supraclavicular, and axillary nodes normal. Neurologic: Grossly normal   Recent Labs  04/20/13 1347  WBC 10.5*  HGB 11.0*  HCT 34.0*  PLT 205       No results found.  Assessment and Plan:   79 year old 1 with the following issues:  1. Leukocytosis: This appears to be very mild and fluctuating at this time. Her white cell count today is 10.5 which is barely above the normal range. Reviewing her counts for the last 3 years revealed mild leukocytosis that is fluctuating and probably reactive in nature. Her differential does reveal eosinophilia which was clearly developed in the last month. This is most likely reactive in nature and due to allergy to a specific item. It is unclear to me what is she allergic to. I have reviewed her medication, potential environmental exposure, and possibly her vaccination. Any of these items could have caused mild allergy manifesting itself with pruritus and eosinophilia. I do not see anything to suggest lymphoproliferative disorder. I do not see the need for further evaluation with CT scans or bone marrow biopsy. I do recommend continued active surveillance and if her counts continue to persistently elevated then we will reevaluate.  2. Pruritus: I think this is due to allergy rather than a blood disorder. My recommendation would be a consultation with Dermatology or an Allergist if these symptoms persist.  3. Mild anemia: Her hemoglobin is 11 which is really consistent with  her baseline that have fluctuated between 11 and 12 for last 3 years.  All her questions are answered today and I will be happy to see her in the future as needed.

## 2013-04-20 NOTE — Progress Notes (Signed)
Checked in new pt with no financial concerns. °

## 2013-04-20 NOTE — Progress Notes (Signed)
Please see consult note.  

## 2013-06-07 ENCOUNTER — Encounter: Payer: Self-pay | Admitting: Internal Medicine

## 2013-06-07 ENCOUNTER — Ambulatory Visit (INDEPENDENT_AMBULATORY_CARE_PROVIDER_SITE_OTHER): Payer: Medicare PPO | Admitting: Internal Medicine

## 2013-06-07 VITALS — BP 118/64 | HR 58 | Temp 97.5°F | Resp 16 | Ht 65.0 in | Wt 231.0 lb

## 2013-06-07 DIAGNOSIS — L301 Dyshidrosis [pompholyx]: Secondary | ICD-10-CM

## 2013-06-07 DIAGNOSIS — I10 Essential (primary) hypertension: Secondary | ICD-10-CM

## 2013-06-07 MED ORDER — METHYLPREDNISOLONE ACETATE 80 MG/ML IJ SUSP
120.0000 mg | Freq: Once | INTRAMUSCULAR | Status: AC
  Administered 2013-06-07: 120 mg via INTRAMUSCULAR

## 2013-06-07 MED ORDER — TRIAMCINOLONE ACETONIDE 0.5 % EX CREA: 1.0000 "application " | TOPICAL_CREAM | Freq: Three times a day (TID) | CUTANEOUS | Status: DC

## 2013-06-07 NOTE — Assessment & Plan Note (Signed)
She is having a flare of itching so I gave her an injection of deop-medrol IM She will continue taking anti-histamines and I have asker her to apply TAC cream to the AA

## 2013-06-07 NOTE — Patient Instructions (Signed)

## 2013-06-07 NOTE — Addendum Note (Signed)
Addended by: Rock Nephew T on: 06/07/2013 04:16 PM   Modules accepted: Orders

## 2013-06-07 NOTE — Progress Notes (Signed)
  Subjective:    Patient ID: Natalie Goodman, female    DOB: 16-Oct-1924, 79 y.o.   MRN: [id number]  Rash This is a recurrent problem. The current episode started more than 1 month ago. The problem is unchanged. The affected locations include the back, left lower leg and right lower leg. The rash is characterized by itchiness and dryness. Associated with: gold bond powder. Pertinent negatives include no anorexia, congestion, cough, diarrhea, eye pain, facial edema, fatigue, fever, joint pain, nail changes, rhinorrhea, shortness of breath, sore throat or vomiting. Past treatments include anti-itch cream. The treatment provided no relief. Her past medical history is significant for eczema.      Review of Systems  Constitutional: Negative.  Negative for fever, chills, diaphoresis, appetite change and fatigue.  HENT: Negative.  Negative for congestion, rhinorrhea and sore throat.   Eyes: Negative.  Negative for pain.  Respiratory: Negative.  Negative for cough, choking, chest tightness, shortness of breath, wheezing and stridor.   Cardiovascular: Negative.  Negative for chest pain, palpitations and leg swelling.  Gastrointestinal: Negative.  Negative for nausea, vomiting, abdominal pain, diarrhea, constipation and anorexia.  Endocrine: Negative.   Genitourinary: Negative.   Musculoskeletal: Negative.  Negative for joint pain.  Skin: Positive for rash. Negative for nail changes, color change, pallor and wound.  Allergic/Immunologic: Negative.   Neurological: Negative.   Hematological: Negative.  Negative for adenopathy. Does not bruise/bleed easily.  Psychiatric/Behavioral: Negative.        Objective:   Physical Exam  Vitals reviewed. Constitutional: She is oriented to person, place, and time. She appears well-developed and well-nourished. No distress.  HENT:  Head: Normocephalic and atraumatic.  Mouth/Throat: Oropharynx is clear and moist. No oropharyngeal exudate.  Eyes: Conjunctivae are  normal. Right eye exhibits no discharge. Left eye exhibits no discharge. No scleral icterus.  Neck: Normal range of motion. Neck supple. No JVD present. No tracheal deviation present. No thyromegaly present.  Cardiovascular: Normal rate, regular rhythm, normal heart sounds and intact distal pulses.  Exam reveals no gallop and no friction rub.   No murmur heard. Pulmonary/Chest: Effort normal and breath sounds normal. No stridor. No respiratory distress. She has no wheezes. She has no rales. She exhibits no tenderness.  Abdominal: Soft. Bowel sounds are normal. She exhibits no distension and no mass. There is no tenderness. There is no rebound and no guarding.  Musculoskeletal: Normal range of motion. She exhibits no edema and no tenderness.  Lymphadenopathy:    She has no cervical adenopathy.  Neurological: She is oriented to person, place, and time.  Skin: Skin is warm, dry and intact. Rash noted. No purpura noted. Rash is not macular, not papular, not maculopapular, not nodular, not pustular, not vesicular and not urticarial. She is not diaphoretic. No erythema. No pallor.        Lab Results  Component Value Date   WBC 10.5* 04/20/2013   HGB 11.0* 04/20/2013   HCT 34.0* 04/20/2013   PLT 205 04/20/2013   GLUCOSE 98 04/20/2013   CHOL 193 02/15/2013   TRIG 124.0 02/15/2013   HDL 42.70 02/15/2013   LDLDIRECT 144.3 12/04/2011   LDLCALC 126* 02/15/2013   ALT 10 04/20/2013   AST 19 04/20/2013   NA 143 04/20/2013   K 4.4 04/20/2013   CL 113* 02/15/2013   CREATININE 1.4* 04/20/2013   BUN 36.0* 04/20/2013   CO2 24 04/20/2013   TSH 2.64 03/10/2013       Assessment & Plan:

## 2013-06-07 NOTE — Assessment & Plan Note (Signed)
Her BP is well controlled 

## 2013-08-04 ENCOUNTER — Other Ambulatory Visit: Payer: Self-pay | Admitting: Cardiology

## 2013-08-30 ENCOUNTER — Other Ambulatory Visit: Payer: Self-pay | Admitting: Cardiology

## 2013-10-29 ENCOUNTER — Emergency Department (HOSPITAL_COMMUNITY)
Admission: EM | Admit: 2013-10-29 | Discharge: 2013-10-29 | Disposition: A | Discharge status: Home / Self Care | Payer: Medicare PPO | Attending: Emergency Medicine | Admitting: Emergency Medicine

## 2013-10-29 ENCOUNTER — Encounter (HOSPITAL_COMMUNITY): Payer: Self-pay | Admitting: Radiology

## 2013-10-29 ENCOUNTER — Emergency Department (HOSPITAL_COMMUNITY): Payer: Medicare PPO

## 2013-10-29 DIAGNOSIS — Z79899 Other long term (current) drug therapy: Secondary | ICD-10-CM | POA: Insufficient documentation

## 2013-10-29 DIAGNOSIS — N181 Chronic kidney disease, stage 1: Secondary | ICD-10-CM | POA: Insufficient documentation

## 2013-10-29 DIAGNOSIS — I2510 Atherosclerotic heart disease of native coronary artery without angina pectoris: Secondary | ICD-10-CM | POA: Insufficient documentation

## 2013-10-29 DIAGNOSIS — R079 Chest pain, unspecified: Secondary | ICD-10-CM | POA: Insufficient documentation

## 2013-10-29 DIAGNOSIS — M7989 Other specified soft tissue disorders: Secondary | ICD-10-CM | POA: Insufficient documentation

## 2013-10-29 DIAGNOSIS — Z862 Personal history of diseases of the blood and blood-forming organs and certain disorders involving the immune mechanism: Secondary | ICD-10-CM | POA: Insufficient documentation

## 2013-10-29 DIAGNOSIS — I129 Hypertensive chronic kidney disease with stage 1 through stage 4 chronic kidney disease, or unspecified chronic kidney disease: Secondary | ICD-10-CM | POA: Insufficient documentation

## 2013-10-29 DIAGNOSIS — E785 Hyperlipidemia, unspecified: Secondary | ICD-10-CM | POA: Insufficient documentation

## 2013-10-29 DIAGNOSIS — R911 Solitary pulmonary nodule: Secondary | ICD-10-CM | POA: Insufficient documentation

## 2013-10-29 DIAGNOSIS — Z7982 Long term (current) use of aspirin: Secondary | ICD-10-CM | POA: Insufficient documentation

## 2013-10-29 DIAGNOSIS — M1990 Unspecified osteoarthritis, unspecified site: Secondary | ICD-10-CM | POA: Insufficient documentation

## 2013-10-29 DIAGNOSIS — IMO0002 Reserved for concepts with insufficient information to code with codable children: Secondary | ICD-10-CM | POA: Insufficient documentation

## 2013-10-29 DIAGNOSIS — I252 Old myocardial infarction: Secondary | ICD-10-CM | POA: Insufficient documentation

## 2013-10-29 DIAGNOSIS — Z8719 Personal history of other diseases of the digestive system: Secondary | ICD-10-CM | POA: Insufficient documentation

## 2013-10-29 LAB — CBC
HCT: 34.3 % — ABNORMAL LOW (ref 36.0–46.0)
Hemoglobin: 10.7 g/dL — ABNORMAL LOW (ref 12.0–15.0)
MCH: 30.2 pg (ref 26.0–34.0)
MCHC: 31.2 g/dL (ref 30.0–36.0)
MCV: 96.9 fL (ref 78.0–100.0)
PLATELETS: 220 10*3/uL (ref 150–400)
RBC: 3.54 MIL/uL — ABNORMAL LOW (ref 3.87–5.11)
RDW: 13.4 % (ref 11.5–15.5)
WBC: 10.1 10*3/uL (ref 4.0–10.5)

## 2013-10-29 LAB — TROPONIN I: Troponin I: <0.3 ng/mL (ref <0.30)

## 2013-10-29 LAB — BASIC METABOLIC PANEL
BUN: 30 mg/dL — ABNORMAL HIGH (ref 6–23)
CHLORIDE: 104 meq/L (ref 96–112)
CO2: 24 mEq/L (ref 19–32)
Calcium: 10.9 mg/dL — ABNORMAL HIGH (ref 8.4–10.5)
Creatinine, Ser: 1.27 mg/dL — ABNORMAL HIGH (ref 0.50–1.10)
GFR calc non Af Amer: 37 mL/min — ABNORMAL LOW (ref >90)
GFR, EST AFRICAN AMERICAN: 43 mL/min — AB (ref >90)
Glucose, Bld: 94 mg/dL (ref 70–99)
POTASSIUM: 4.8 meq/L (ref 3.7–5.3)
SODIUM: 141 meq/L (ref 137–147)

## 2013-10-29 LAB — D-DIMER, QUANTITATIVE (NOT AT ARMC): D DIMER QUANT: 2.44 ug{FEU}/mL — AB (ref 0.00–0.48)

## 2013-10-29 LAB — I-STAT TROPONIN, ED
TROPONIN I, POC: 0.01 ng/mL (ref 0.00–0.08)
Troponin i, poc: 0 ng/mL (ref 0.00–0.08)

## 2013-10-29 MED ORDER — ASPIRIN 81 MG PO CHEW
324.0000 mg | CHEWABLE_TABLET | Freq: Once | ORAL | Status: AC
  Administered 2013-10-29: 324 mg via ORAL
  Filled 2013-10-29: qty 4

## 2013-10-29 MED ORDER — SODIUM CHLORIDE 0.9 % IV BOLUS (SEPSIS)
500.0000 mL | Freq: Once | INTRAVENOUS | Status: AC
  Administered 2013-10-29: 500 mL via INTRAVENOUS

## 2013-10-29 MED ORDER — NITROGLYCERIN 0.4 MG SL SUBL
0.4000 mg | SUBLINGUAL_TABLET | SUBLINGUAL | Status: DC | PRN
  Administered 2013-10-29: 0.4 mg via SUBLINGUAL

## 2013-10-29 MED ORDER — IOHEXOL 350 MG/ML SOLN
100.0000 mL | Freq: Once | INTRAVENOUS | Status: AC | PRN
  Administered 2013-10-29: 100 mL via INTRAVENOUS

## 2013-10-29 MED ORDER — FENTANYL CITRATE 0.05 MG/ML IJ SOLN: 50.0000 ug | INTRAMUSCULAR | Status: DC | PRN

## 2013-10-29 NOTE — Discharge Instructions (Signed)
Call cardiology on Monday. If you were given medicines take as directed.  If you are on coumadin or contraceptives realize their levels and effectiveness is altered by many different medicines.  If you have any reaction (rash, tongues swelling, other) to the medicines stop taking and see a physician.   Please follow up as directed and return to the ER or see a physician for new or worsening symptoms.  Thank you. Filed Vitals:   10/29/13 1100 10/29/13 1120 10/29/13 1140 10/29/13 1349  BP: 123/64 125/61 109/69 138/52  Pulse: 58 57 62 68  Temp:    97.5 F (36.4 C)  TempSrc:    Oral  Resp: 13 16 24 22   SpO2: 96% 100% 97% 98%    Chest Pain (Nonspecific) It is often hard to give a specific diagnosis for the cause of chest pain. There is always a chance that your pain could be related to something serious, such as a heart attack or a blood clot in the lungs. You need to follow up with your caregiver for further evaluation. CAUSES   Heartburn.  Pneumonia or bronchitis.  Anxiety or stress.  Inflammation around your heart (pericarditis) or lung (pleuritis or pleurisy).  A blood clot in the lung.  A collapsed lung (pneumothorax). It can develop suddenly on its own (spontaneous pneumothorax) or from injury (trauma) to the chest.  Shingles infection (herpes zoster virus). The chest wall is composed of bones, muscles, and cartilage. Any of these can be the source of the pain.  The bones can be bruised by injury.  The muscles or cartilage can be strained by coughing or overwork.  The cartilage can be affected by inflammation and become sore (costochondritis). DIAGNOSIS  Lab tests or other studies, such as X-rays, electrocardiography, stress testing, or cardiac imaging, may be needed to find the cause of your pain.  TREATMENT   Treatment depends on what may be causing your chest pain. Treatment may include:  Acid blockers for heartburn.  Anti-inflammatory medicine.  Pain medicine for  inflammatory conditions.  Antibiotics if an infection is present.  You may be advised to change lifestyle habits. This includes stopping smoking and avoiding alcohol, caffeine, and chocolate.  You may be advised to keep your head raised (elevated) when sleeping. This reduces the chance of acid going backward from your stomach into your esophagus.  Most of the time, nonspecific chest pain will improve within 2 to 3 days with rest and mild pain medicine. HOME CARE INSTRUCTIONS   If antibiotics were prescribed, take your antibiotics as directed. Finish them even if you start to feel better.  For the next few days, avoid physical activities that bring on chest pain. Continue physical activities as directed.  Do not smoke.  Avoid drinking alcohol.  Only take over-the-counter or prescription medicine for pain, discomfort, or fever as directed by your caregiver.  Follow your caregiver's suggestions for further testing if your chest pain does not go away.  Keep any follow-up appointments you made. If you do not go to an appointment, you could develop lasting (chronic) problems with pain. If there is any problem keeping an appointment, you must call to reschedule. SEEK MEDICAL CARE IF:   You think you are having problems from the medicine you are taking. Read your medicine instructions carefully.  Your chest pain does not go away, even after treatment.  You develop a rash with blisters on your chest. SEEK IMMEDIATE MEDICAL CARE IF:   You have increased chest pain or  pain that spreads to your arm, neck, jaw, back, or abdomen.  You develop shortness of breath, an increasing cough, or you are coughing up blood.  You have severe back or abdominal pain, feel nauseous, or vomit.  You develop severe weakness, fainting, or chills.  You have a fever. THIS IS AN EMERGENCY. Do not wait to see if the pain will go away. Get medical help at once. Call your local emergency services (911 in [place]  Do not drive yourself to the hospital. MAKE SURE YOU:   Understand these instructions.  Will watch your condition.  Will get help right away if you are not doing well or get worse. Document Released: 04/10/2005 Document Revised: 09/23/2011 Document Reviewed: 02/04/2008 [facility] Patient Information 2014 [place], [place]. Have repeat CT scan of chest in 1 year.

## 2013-10-29 NOTE — ED Notes (Addendum)
Pt reports to ED for left chest pain since yesterday. Patient states PCP states that her artery is "not working."

## 2013-10-29 NOTE — ED Notes (Signed)
MD at bedside. 

## 2013-10-29 NOTE — ED Provider Notes (Signed)
CSN: [id number]     Arrival date & time 10/29/13  [id number] History   First MD Initiated Contact with Patient 10/29/13 [phone]     Chief Complaint  Patient presents with  . Chest Pain     (Consider location/radiation/quality/duration/timing/severity/associated sxs/prior Treatment) HPI Comments: 80 year old female with hyperlipidemia, high blood pressure coronary artery disease reflux, history of myocardial infarction, on aspirin daily presents with left upper chest pain constant since yesterday evening. Pain started at rest, no history of similar. Patient unable to describe the pain. Nonradiating without diaphoresis or nausea. No injuries per patient. Nothing specifically worsens or improves. Patient follows with Dr. Jens Somrenshaw cardiology. Patient has had worsening left leg swelling without history of blood clot, active cancer, or recent surgery.   Patient is a 80 y.o. female presenting with chest pain. The history is provided by the patient.  Chest Pain Associated symptoms: no abdominal pain, no back pain, no cough, no fever, no headache, no shortness of breath and not vomiting     Past Medical History  Diagnosis Date  . Hypertension   . Hyperlipidemia   . CAD (coronary artery disease)   . MYOCARDIAL INFARCTION, HX OF   . GERD   . KIDNEY DISEASE, CHRONIC, STAGE I   . OSTEOARTHRITIS   . PARESTHESIA   . ESOPHAGEAL STRICTURE   . Anemia    Past Surgical History  Procedure Laterality Date  . Abdominal hysterectomy    . Revision total knee arthroplasty      Bilateral  . Tonsillectomy     Family History  Problem Relation Age of Onset  . Hypertension Mother   . Hypertension Father   . Alcohol abuse Neg Hx   . Cancer Neg Hx   . Stroke Neg Hx   . Heart disease Mother     CHF   History  Substance Use Topics  . Smoking status: Never Smoker   . Smokeless tobacco: Never Used  . Alcohol Use: No   OB History   Grav Para Term Preterm Abortions TAB SAB Ect Mult Living                  Review of Systems  Constitutional: Negative for fever and chills.  HENT: Negative for congestion.   Eyes: Negative for visual disturbance.  Respiratory: Negative for cough and shortness of breath.   Cardiovascular: Positive for chest pain and leg swelling.  Gastrointestinal: Negative for vomiting and abdominal pain.  Genitourinary: Negative for dysuria and flank pain.  Musculoskeletal: Negative for back pain, neck pain and neck stiffness.  Skin: Negative for rash.  Neurological: Negative for light-headedness and headaches.      Allergies  Codeine  Home Medications   Prior to Admission medications   Medication Sig Start Date End Date Taking? Authorizing Provider  aspirin 325 MG tablet Take 325 mg by mouth daily.    Historical Provider, MD  ezetimibe (ZETIA) 10 MG tablet Take 1 tablet (10 mg total) by mouth daily. 03/10/13   Etta Grandchildhomas L Jones, MD  furosemide (LASIX) 20 MG tablet TAKE ONE TABLET BY MOUTH ONCE DAILY    Lewayne BuntingBrian S Crenshaw, MD  hydrOXYzine (ATARAX/VISTARIL) 10 MG tablet Take 1 tablet (10 mg total) by mouth 3 (three) times daily as needed for itching. 04/01/13   Etta Grandchildhomas L Jones, MD  metoprolol (LOPRESSOR) 50 MG tablet TAKE ONE TABLET BY MOUTH TWICE DAILY ( NO REFILLS UNTILL APPOINTMENT 02-15-2013) 08/04/13   Lewayne BuntingBrian S Crenshaw, MD  rosuvastatin (CRESTOR) 10 MG tablet Take 1  tablet (10 mg total) by mouth daily. 02/15/13   Lewayne Bunting, MD  triamcinolone cream (KENALOG) 0.5 % Apply 1 application topically 3 (three) times daily. 06/07/13   Etta Grandchild, MD  valsartan (DIOVAN) 160 MG tablet Take 1 tablet (160 mg total) by mouth daily. 02/15/13   Lewayne Bunting, MD   BP 139/63  Pulse 66  Temp(Src) 98.1 F (36.7 C)  Resp 22  SpO2 100% Physical Exam  Nursing note and vitals reviewed. Constitutional: She is oriented to person, place, and time. She appears well-developed and well-nourished.  HENT:  Head: Normocephalic and atraumatic.  Eyes: Conjunctivae are normal. Right eye  exhibits no discharge. Left eye exhibits no discharge.  Neck: Normal range of motion. Neck supple. No tracheal deviation present.  Cardiovascular: Normal rate and regular rhythm.   Pulmonary/Chest: Effort normal and breath sounds normal.  Abdominal: Soft. She exhibits no distension. There is no tenderness. There is no guarding.  Musculoskeletal: She exhibits edema (mild bilateral lower extremities worse in the left).  Neurological: She is alert and oriented to person, place, and time.  Skin: Skin is warm. No rash noted.  Psychiatric: She has a normal mood and affect.    ED Course  Procedures (including critical care time) Labs Review Labs Reviewed  CBC - Abnormal; Notable for the following:    RBC 3.54 (*)    Hemoglobin 10.7 (*)    HCT 34.3 (*)    All other components within normal limits  BASIC METABOLIC PANEL - Abnormal; Notable for the following:    BUN 30 (*)    Creatinine, Ser 1.27 (*)    Calcium 10.9 (*)    GFR calc non Af Amer 37 (*)    GFR calc Af Amer 43 (*)    All other components within normal limits  D-DIMER, QUANTITATIVE - Abnormal; Notable for the following:    D-Dimer, Quant 2.44 (*)    All other components within normal limits  TROPONIN I  TROPONIN I  I-STAT TROPOININ, ED  I-STAT TROPOININ, ED    Imaging Review Dg Chest 2 View  10/29/2013   CLINICAL DATA:  CHEST PAIN  EXAM: CHEST  2 VIEW  COMPARISON:  None.  FINDINGS: Low lung volumes. The cardiac silhouette is enlarged. Both lungs are clear. No acute osseous abnormalities.  IMPRESSION: No active cardiopulmonary disease.  Cardiomegaly.   Electronically Signed   By: Salome Holmes M.D.   On: 10/29/2013 10:30   Ct Angio Chest Pe W/cm &/or Wo Cm  10/29/2013   CLINICAL DATA:  cp, sob, ddimer  EXAM: CT ANGIOGRAPHY CHEST WITH CONTRAST  TECHNIQUE: Multidetector CT imaging of the chest was performed using the standard protocol during bolus administration of intravenous contrast. Multiplanar CT image reconstructions  and MIPs were obtained to evaluate the vascular anatomy.  CONTRAST:  OMNIPAQUE IOHEXOL 350 MG/ML SOLN  COMPARISON:  DG CHEST 2 VIEW dated 10/29/2013  FINDINGS: The thoracic inlet and unremarkable.  No mediastinal nor hilar adenopathy or masses. Atherosclerotic calcifications within the coronary vessels. Heart enlarged.  No filling defects within the main, lobar, or segmental pulmonary arteries. There is no evidence of a thoracic aortic aneurysm nor dissection. Atherosclerotic calcifications are appreciated within the aortic arch.  A 4 mm subpleural nodule lateral right lower lobe image 45 series 7. Second subpleural 4 mm nodule lateral periphery left lower lobe image 53 series 7. Mild areas of scarring within the lung bases. No focal regions of consolidation or focal infiltrates.  Central airways are patent.  The visualized upper abdominal viscera demonstrate no gross abnormalities.  Multilevel degenerative changes within the thoracolumbar spine no aggressive appearing osseous lesions.  Review of the MIP images confirms the above findings.  IMPRESSION: No CT evidence of pulmonary arterial embolic disease.  Areas of scarring within the lung bases.  4 mm subpleural nodules periphery of the right middle lobe and left lower lobe. If the patient is at high risk for bronchogenic carcinoma, follow-up chest CT at 1 year is recommended. If the patient is at low risk, no follow-up is needed. This recommendation follows the consensus statement: Guidelines for Management of Small Pulmonary Nodules Detected on CT Scans: A Statement from the Fleischner Society as published in Radiology 2005; 237:395-400.   Electronically Signed   By: Salome HolmesHector  Cooper M.D.   On: 10/29/2013 13:49     EKG Interpretation None      Date: 10/29/2013  Rate: 70  Rhythm: normal sinus rhythm  QRS Axis: left  Intervals: PR prolonged  ST/T Wave abnormalities: nonspecific T wave changes  Conduction Disutrbances:first-degree A-V block    Narrative Interpretation:  No stemi   MDM   Final diagnoses:  Chest pain  Pulmonary nodule  ARF CAD  Patient with known coronary artery disease and heart attack history presents with atypical chest pain. With patient's significant history concern for possible CAD, and STEMI versus less likely PE. Patient has had worsening left leg swelling and no other reasonable blood clots and d-dimer sent. Nitroglycerin and fentanyl when necessary for pain. Chest x-ray ordered.  Recheck of patient she has no symptoms and feels well. CT scan of chest revealed small pulmonary nodule and no pulmonary embolism. Discussed observation in the hospital versus close outpatient followup with her cardiologist. Sherron MondaySpoke with Rosann Auerbachrish from cardiology who will arrange appointment. Patient wishes to followup outpatient and has not noticed in the hospital. Patient has capacity to make decisions exam is with patient. EKG no acute STEMI or acute ischemia findings. If delta troponin neg pt dc home. Results and differential diagnosis were discussed with the patient. Close follow up outpatient was discussed, patient comfortable with the plan.   Filed Vitals:   10/29/13 1100 10/29/13 1120 10/29/13 1140 10/29/13 1349  BP: 123/64 125/61 109/69 138/52  Pulse: 58 57 62 68  Temp:    97.5 F (36.4 C)  TempSrc:    Oral  Resp: 13 16 24 22   SpO2: 96% 100% 97% 98%         Enid SkeensJoshua M Anjanae Woehrle, MD 10/29/13 1456

## 2013-12-15 ENCOUNTER — Other Ambulatory Visit: Payer: Self-pay | Admitting: Cardiology

## 2014-01-03 ENCOUNTER — Telehealth: Payer: Self-pay | Admitting: *Deleted

## 2014-01-03 NOTE — Telephone Encounter (Signed)
[facility]

## 2014-01-04 NOTE — Telephone Encounter (Signed)
no PA nneded for valsartan 160 mg

## 2014-01-04 NOTE — Telephone Encounter (Signed)
[employer] will only approve valsartan and not name brand diovan

## 2014-01-05 ENCOUNTER — Encounter: Payer: Self-pay | Admitting: *Deleted

## 2014-01-25 ENCOUNTER — Other Ambulatory Visit: Payer: Self-pay | Admitting: *Deleted

## 2014-01-25 DIAGNOSIS — I10 Essential (primary) hypertension: Secondary | ICD-10-CM

## 2014-01-25 DIAGNOSIS — I2510 Atherosclerotic heart disease of native coronary artery without angina pectoris: Secondary | ICD-10-CM

## 2014-01-25 MED ORDER — VALSARTAN 160 MG PO TABS: 160.0000 mg | ORAL_TABLET | Freq: Every day | ORAL | Status: DC

## 2014-02-07 ENCOUNTER — Other Ambulatory Visit: Payer: Self-pay | Admitting: Cardiology

## 2014-02-09 ENCOUNTER — Other Ambulatory Visit: Payer: Self-pay

## 2014-02-23 ENCOUNTER — Other Ambulatory Visit: Payer: Self-pay | Admitting: Cardiology

## 2014-03-24 ENCOUNTER — Encounter: Payer: Self-pay | Admitting: Internal Medicine

## 2014-03-24 ENCOUNTER — Other Ambulatory Visit (INDEPENDENT_AMBULATORY_CARE_PROVIDER_SITE_OTHER): Payer: Commercial Managed Care - HMO

## 2014-03-24 ENCOUNTER — Ambulatory Visit (INDEPENDENT_AMBULATORY_CARE_PROVIDER_SITE_OTHER): Payer: Medicare PPO | Admitting: Internal Medicine

## 2014-03-24 VITALS — BP 130/70 | HR 86 | Temp 97.5°F | Resp 16 | Ht 65.0 in | Wt 230.0 lb

## 2014-03-24 DIAGNOSIS — N181 Chronic kidney disease, stage 1: Secondary | ICD-10-CM

## 2014-03-24 DIAGNOSIS — I2510 Atherosclerotic heart disease of native coronary artery without angina pectoris: Secondary | ICD-10-CM

## 2014-03-24 DIAGNOSIS — R209 Unspecified disturbances of skin sensation: Secondary | ICD-10-CM

## 2014-03-24 DIAGNOSIS — D72829 Elevated white blood cell count, unspecified: Secondary | ICD-10-CM

## 2014-03-24 DIAGNOSIS — I10 Essential (primary) hypertension: Secondary | ICD-10-CM

## 2014-03-24 DIAGNOSIS — K222 Esophageal obstruction: Secondary | ICD-10-CM

## 2014-03-24 DIAGNOSIS — K219 Gastro-esophageal reflux disease without esophagitis: Secondary | ICD-10-CM

## 2014-03-24 DIAGNOSIS — Z23 Encounter for immunization: Secondary | ICD-10-CM

## 2014-03-24 DIAGNOSIS — E785 Hyperlipidemia, unspecified: Secondary | ICD-10-CM

## 2014-03-24 DIAGNOSIS — M1990 Unspecified osteoarthritis, unspecified site: Secondary | ICD-10-CM

## 2014-03-24 DIAGNOSIS — Z0000 Encounter for general adult medical examination without abnormal findings: Secondary | ICD-10-CM

## 2014-03-24 LAB — CBC WITH DIFFERENTIAL/PLATELET
BASOS PCT: 0.2 % (ref 0.0–3.0)
Basophils Absolute: 0 10*3/uL (ref 0.0–0.1)
EOS ABS: 0.4 10*3/uL (ref 0.0–0.7)
Eosinophils Relative: 3.8 % (ref 0.0–5.0)
HCT: 35.8 % — ABNORMAL LOW (ref 36.0–46.0)
Hemoglobin: 11.7 g/dL — ABNORMAL LOW (ref 12.0–15.0)
Lymphocytes Relative: 17 % (ref 12.0–46.0)
Lymphs Abs: 1.6 10*3/uL (ref 0.7–4.0)
MCHC: 32.7 g/dL (ref 30.0–36.0)
MCV: 92.1 fl (ref 78.0–100.0)
Monocytes Absolute: 1 10*3/uL (ref 0.1–1.0)
Monocytes Relative: 10.8 % (ref 3.0–12.0)
Neutro Abs: 6.5 10*3/uL (ref 1.4–7.7)
Neutrophils Relative %: 68.2 % (ref 43.0–77.0)
Platelets: 246 10*3/uL (ref 150.0–400.0)
RBC: 3.88 Mil/uL (ref 3.87–5.11)
RDW: 14.8 % (ref 11.5–15.5)
WBC: 9.6 10*3/uL (ref 4.0–10.5)

## 2014-03-24 LAB — COMPREHENSIVE METABOLIC PANEL
ALK PHOS: 78 U/L (ref 39–117)
ALT: 18 U/L (ref 0–35)
AST: 29 U/L (ref 0–37)
Albumin: 3.5 g/dL (ref 3.5–5.2)
BUN: 29 mg/dL — AB (ref 6–23)
CO2: 25 mEq/L (ref 19–32)
Calcium: 9.5 mg/dL (ref 8.4–10.5)
Chloride: 108 mEq/L (ref 96–112)
Creatinine, Ser: 1.2 mg/dL (ref 0.4–1.2)
GFR: 55.68 mL/min — ABNORMAL LOW (ref >60.00)
Glucose, Bld: 96 mg/dL (ref 70–99)
POTASSIUM: 4.2 meq/L (ref 3.5–5.1)
Sodium: 139 mEq/L (ref 135–145)
Total Bilirubin: 0.9 mg/dL (ref 0.2–1.2)
Total Protein: 8.2 g/dL (ref 6.0–8.3)

## 2014-03-24 LAB — LIPID PANEL
CHOL/HDL RATIO: 5
Cholesterol: 207 mg/dL — ABNORMAL HIGH (ref 0–200)
HDL: 45 mg/dL (ref >39.00)
LDL CALC: 145 mg/dL — AB (ref 0–99)
NonHDL: 162
TRIGLYCERIDES: 85 mg/dL (ref 0.0–149.0)
VLDL: 17 mg/dL (ref 0.0–40.0)

## 2014-03-24 LAB — TSH: TSH: 2.43 u[IU]/mL (ref 0.35–4.50)

## 2014-03-24 NOTE — Progress Notes (Signed)
Pre visit review using our clinic review tool, if applicable. No additional management support is needed unless otherwise documented below in the visit note. 

## 2014-03-24 NOTE — Patient Instructions (Signed)
Hypertension Hypertension, commonly called high blood pressure, is when the force of blood pumping through your arteries is too strong. Your arteries are the blood vessels that carry blood from your heart throughout your body. A blood pressure reading consists of a higher number over a lower number, such as 110/72. The higher number (systolic) is the pressure inside your arteries when your heart pumps. The lower number (diastolic) is the pressure inside your arteries when your heart relaxes. Ideally you want your blood pressure below 120/80. Hypertension forces your heart to work harder to pump blood. Your arteries may become narrow or stiff. Having hypertension puts you at risk for heart disease, stroke, and other problems.  RISK FACTORS Some risk factors for high blood pressure are controllable. Others are not.  Risk factors you cannot control include:   Race. You may be at higher risk if you are African American.  Age. Risk increases with age.  Gender. Men are at higher risk than women before age 45 years. After age 65, women are at higher risk than men. Risk factors you can control include:  Not getting enough exercise or physical activity.  Being overweight.  Getting too much fat, sugar, calories, or salt in your diet.  Drinking too much alcohol. SIGNS AND SYMPTOMS Hypertension does not usually cause signs or symptoms. Extremely high blood pressure (hypertensive crisis) may cause headache, anxiety, shortness of breath, and nosebleed. DIAGNOSIS  To check if you have hypertension, your health care provider will measure your blood pressure while you are seated, with your arm held at the level of your heart. It should be measured at least twice using the same arm. Certain conditions can cause a difference in blood pressure between your right and left arms. A blood pressure reading that is higher than normal on one occasion does not mean that you need treatment. If one blood pressure reading  is high, ask your health care provider about having it checked again. TREATMENT  Treating high blood pressure includes making lifestyle changes and possibly taking medicine. Living a healthy lifestyle can help lower high blood pressure. You may need to change some of your habits. Lifestyle changes may include:  Following the DASH diet. This diet is high in fruits, vegetables, and whole grains. It is low in salt, red meat, and added sugars.  Getting at least 2 hours of brisk physical activity every week.  Losing weight if necessary.  Not smoking.  Limiting alcoholic beverages.  Learning ways to reduce stress. If lifestyle changes are not enough to get your blood pressure under control, your health care provider may prescribe medicine. You may need to take more than one. Work closely with your health care provider to understand the risks and benefits. HOME CARE INSTRUCTIONS  Have your blood pressure rechecked as directed by your health care provider.   Take medicines only as directed by your health care provider. Follow the directions carefully. Blood pressure medicines must be taken as prescribed. The medicine does not work as well when you skip doses. Skipping doses also puts you at risk for problems.   Do not smoke.   Monitor your blood pressure at home as directed by your health care provider. SEEK MEDICAL CARE IF:   You think you are having a reaction to medicines taken.  You have recurrent headaches or feel dizzy.  You have swelling in your ankles.  You have trouble with your vision. SEEK IMMEDIATE MEDICAL CARE IF:  You develop a severe headache or confusion.    You have unusual weakness, numbness, or feel faint.  You have severe chest or abdominal pain.  You vomit repeatedly.  You have trouble breathing. MAKE SURE YOU:   Understand these instructions.  Will watch your condition.  Will get help right away if you are not doing well or get worse. Document  Released: 07/01/2005 Document Revised: 11/15/2013 Document Reviewed: 04/23/2013 [facility] Patient Information 2015 [place], [place]. This information is not intended to replace advice given to you by your health care provider. Make sure you discuss any questions you have with your health care provider. Preventive Care for Adults A healthy lifestyle and preventive care can promote health and wellness. Preventive health guidelines for women include the following key practices.  A routine yearly physical is a good way to check with your health care provider about your health and preventive screening. It is a chance to share any concerns and updates on your health and to receive a thorough exam.  Visit your dentist for a routine exam and preventive care every 6 months. Brush your teeth twice a day and floss once a day. Good oral hygiene prevents tooth decay and gum disease.  The frequency of eye exams is based on your age, health, family medical history, use of contact lenses, and other factors. Follow your health care provider's recommendations for frequency of eye exams.  Eat a healthy diet. Foods like vegetables, fruits, whole grains, low-fat dairy products, and lean protein foods contain the nutrients you need without too many calories. Decrease your intake of foods high in solid fats, added sugars, and salt. Eat the right amount of calories for you.Get information about a proper diet from your health care provider, if necessary.  Regular physical exercise is one of the most important things you can do for your health. Most adults should get at least 150 minutes of moderate-intensity exercise (any activity that increases your heart rate and causes you to sweat) each week. In addition, most adults need muscle-strengthening exercises on 2 or more days a week.  Maintain a healthy weight. The body mass index (BMI) is a screening tool to identify possible weight problems. It provides an estimate of body fat  based on height and weight. Your health care provider can find your BMI and can help you achieve or maintain a healthy weight.For adults 20 years and older:  A BMI below 18.5 is considered underweight.  A BMI of 18.5 to 24.9 is normal.  A BMI of 25 to 29.9 is considered overweight.  A BMI of 30 and above is considered obese.  Maintain normal blood lipids and cholesterol levels by exercising and minimizing your intake of saturated fat. Eat a balanced diet with plenty of fruit and vegetables. Blood tests for lipids and cholesterol should begin at age 43 and be repeated every 5 years. If your lipid or cholesterol levels are high, you are over 50, or you are at high risk for heart disease, you may need your cholesterol levels checked more frequently.Ongoing high lipid and cholesterol levels should be treated with medicines if diet and exercise are not working.  If you smoke, find out from your health care provider how to quit. If you do not use tobacco, do not start.  Lung cancer screening is recommended for adults aged 21-80 years who are at high risk for developing lung cancer because of a history of smoking. A yearly low-dose CT scan of the lungs is recommended for people who have at least a 30-pack-year history of smoking  and are a current smoker or have quit within the past 15 years. A pack year of smoking is smoking an average of 1 pack of cigarettes a day for 1 year (for example: 1 pack a day for 30 years or 2 packs a day for 15 years). Yearly screening should continue until the smoker has stopped smoking for at least 15 years. Yearly screening should be stopped for people who develop a health problem that would prevent them from having lung cancer treatment.  If you are pregnant, do not drink alcohol. If you are breastfeeding, be very cautious about drinking alcohol. If you are not pregnant and choose to drink alcohol, do not have more than 1 drink per day. One drink is considered to be 12  ounces (355 mL) of beer, 5 ounces (148 mL) of wine, or 1.5 ounces (44 mL) of liquor.  Avoid use of street drugs. Do not share needles with anyone. Ask for help if you need support or instructions about stopping the use of drugs.  High blood pressure causes heart disease and increases the risk of stroke. Your blood pressure should be checked at least every 1 to 2 years. Ongoing high blood pressure should be treated with medicines if weight loss and exercise do not work.  If you are 54-39 years old, ask your health care provider if you should take aspirin to prevent strokes.  Diabetes screening involves taking a blood sample to check your fasting blood sugar level. This should be done once every 3 years, after age 66, if you are within normal weight and without risk factors for diabetes. Testing should be considered at a younger age or be carried out more frequently if you are overweight and have at least 1 risk factor for diabetes.  Breast cancer screening is essential preventive care for women. You should practice "breast self-awareness." This means understanding the normal appearance and feel of your breasts and may include breast self-examination. Any changes detected, no matter how small, should be reported to a health care provider. Women in their 71s and 30s should have a clinical breast exam (CBE) by a health care provider as part of a regular health exam every 1 to 3 years. After age 42, women should have a CBE every year. Starting at age 47, women should consider having a mammogram (breast X-ray test) every year. Women who have a family history of breast cancer should talk to their health care provider about genetic screening. Women at a high risk of breast cancer should talk to their health care providers about having an MRI and a mammogram every year.  Breast cancer gene (BRCA)-related cancer risk assessment is recommended for women who have family members with BRCA-related cancers.  BRCA-related cancers include breast, ovarian, tubal, and peritoneal cancers. Having family members with these cancers may be associated with an increased risk for harmful changes (mutations) in the breast cancer genes BRCA1 and BRCA2. Results of the assessment will determine the need for genetic counseling and BRCA1 and BRCA2 testing.  Routine pelvic exams to screen for cancer are no longer recommended for nonpregnant women who are considered low risk for cancer of the pelvic organs (ovaries, uterus, and vagina) and who do not have symptoms. Ask your health care provider if a screening pelvic exam is right for you.  If you have had past treatment for cervical cancer or a condition that could lead to cancer, you need Pap tests and screening for cancer for at least 20 years after  your treatment. If Pap tests have been discontinued, your risk factors (such as having a new sexual partner) need to be reassessed to determine if screening should be resumed. Some women have medical problems that increase the chance of getting cervical cancer. In these cases, your health care provider may recommend more frequent screening and Pap tests.  The HPV test is an additional test that may be used for cervical cancer screening. The HPV test looks for the virus that can cause the cell changes on the cervix. The cells collected during the Pap test can be tested for HPV. The HPV test could be used to screen women aged 71 years and older, and should be used in women of any age who have unclear Pap test results. After the age of 78, women should have HPV testing at the same frequency as a Pap test.  Colorectal cancer can be detected and often prevented. Most routine colorectal cancer screening begins at the age of 62 years and continues through age 65 years. However, your health care provider may recommend screening at an earlier age if you have risk factors for colon cancer. On a yearly basis, your health care provider may  provide home test kits to check for hidden blood in the stool. Use of a small camera at the end of a tube, to directly examine the colon (sigmoidoscopy or colonoscopy), can detect the earliest forms of colorectal cancer. Talk to your health care provider about this at age 66, when routine screening begins. Direct exam of the colon should be repeated every 5-10 years through age 5 years, unless early forms of pre-cancerous polyps or small growths are found.  People who are at an increased risk for hepatitis B should be screened for this virus. You are considered at high risk for hepatitis B if:  You were born in a country where hepatitis B occurs often. Talk with your health care provider about which countries are considered high risk.  Your parents were born in a high-risk country and you have not received a shot to protect against hepatitis B (hepatitis B vaccine).  You have HIV or AIDS.  You use needles to inject street drugs.  You live with, or have sex with, someone who has hepatitis B.  You get hemodialysis treatment.  You take certain medicines for conditions like cancer, organ transplantation, and autoimmune conditions.  Hepatitis C blood testing is recommended for all people born from [birth year] through 1963 and any individual with known risks for hepatitis C.  Practice safe sex. Use condoms and avoid high-risk sexual practices to reduce the spread of sexually transmitted infections (STIs). STIs include gonorrhea, chlamydia, syphilis, trichomonas, herpes, HPV, and human immunodeficiency virus (HIV). Herpes, HIV, and HPV are viral illnesses that have no cure. They can result in disability, cancer, and death.  You should be screened for sexually transmitted illnesses (STIs) including gonorrhea and chlamydia if:  You are sexually active and are younger than 24 years.  You are older than 24 years and your health care provider tells you that you are at risk for this type of  infection.  Your sexual activity has changed since you were last screened and you are at an increased risk for chlamydia or gonorrhea. Ask your health care provider if you are at risk.  If you are at risk of being infected with HIV, it is recommended that you take a prescription medicine daily to prevent HIV infection. This is called preexposure prophylaxis (PrEP). You are considered  at risk if:  You are a heterosexual woman, are sexually active, and are at increased risk for HIV infection.  You take drugs by injection.  You are sexually active with a partner who has HIV.  Talk with your health care provider about whether you are at high risk of being infected with HIV. If you choose to begin PrEP, you should first be tested for HIV. You should then be tested every 3 months for as long as you are taking PrEP.  Osteoporosis is a disease in which the bones lose minerals and strength with aging. This can result in serious bone fractures or breaks. The risk of osteoporosis can be identified using a bone density scan. Women ages 16 years and over and women at risk for fractures or osteoporosis should discuss screening with their health care providers. Ask your health care provider whether you should take a calcium supplement or vitamin D to reduce the rate of osteoporosis.  Menopause can be associated with physical symptoms and risks. Hormone replacement therapy is available to decrease symptoms and risks. You should talk to your health care provider about whether hormone replacement therapy is right for you.  Use sunscreen. Apply sunscreen liberally and repeatedly throughout the day. You should seek shade when your shadow is shorter than you. Protect yourself by wearing long sleeves, pants, a wide-brimmed hat, and sunglasses year round, whenever you are outdoors.  Once a month, do a whole body skin exam, using a mirror to look at the skin on your back. Tell your health care provider of new moles,  moles that have irregular borders, moles that are larger than a pencil eraser, or moles that have changed in shape or color.  Stay current with required vaccines (immunizations).  Influenza vaccine. All adults should be immunized every year.  Tetanus, diphtheria, and acellular pertussis (Td, Tdap) vaccine. Pregnant women should receive 1 dose of Tdap vaccine during each pregnancy. The dose should be obtained regardless of the length of time since the last dose. Immunization is preferred during the 27th-36th week of gestation. An adult who has not previously received Tdap or who does not know her vaccine status should receive 1 dose of Tdap. This initial dose should be followed by tetanus and diphtheria toxoids (Td) booster doses every 10 years. Adults with an unknown or incomplete history of completing a 3-dose immunization series with Td-containing vaccines should begin or complete a primary immunization series including a Tdap dose. Adults should receive a Td booster every 10 years.  Varicella vaccine. An adult without evidence of immunity to varicella should receive 2 doses or a second dose if she has previously received 1 dose. Pregnant females who do not have evidence of immunity should receive the first dose after pregnancy. This first dose should be obtained before leaving the health care facility. The second dose should be obtained 4-8 weeks after the first dose.  Human papillomavirus (HPV) vaccine. Females aged 13-26 years who have not received the vaccine previously should obtain the 3-dose series. The vaccine is not recommended for use in pregnant females. However, pregnancy testing is not needed before receiving a dose. If a female is found to be pregnant after receiving a dose, no treatment is needed. In that case, the remaining doses should be delayed until after the pregnancy. Immunization is recommended for any person with an immunocompromised condition through the age of 95 years if she  did not get any or all doses earlier. During the 3-dose series, the second  dose should be obtained 4-8 weeks after the first dose. The third dose should be obtained 24 weeks after the first dose and 16 weeks after the second dose.  Zoster vaccine. One dose is recommended for adults aged 86 years or older unless certain conditions are present.  Measles, mumps, and rubella (MMR) vaccine. Adults born before [birth year] generally are considered immune to measles and mumps. Adults born in [birth year] or later should have 1 or more doses of MMR vaccine unless there is a contraindication to the vaccine or there is laboratory evidence of immunity to each of the three diseases. A routine second dose of MMR vaccine should be obtained at least 28 days after the first dose for students attending postsecondary schools, health care workers, or international travelers. People who received inactivated measles vaccine or an unknown type of measles vaccine during 1963-1967 should receive 2 doses of MMR vaccine. People who received inactivated mumps vaccine or an unknown type of mumps vaccine before 1979 and are at high risk for mumps infection should consider immunization with 2 doses of MMR vaccine. For females of childbearing age, rubella immunity should be determined. If there is no evidence of immunity, females who are not pregnant should be vaccinated. If there is no evidence of immunity, females who are pregnant should delay immunization until after pregnancy. Unvaccinated health care workers born before [birth year] who lack laboratory evidence of measles, mumps, or rubella immunity or laboratory confirmation of disease should consider measles and mumps immunization with 2 doses of MMR vaccine or rubella immunization with 1 dose of MMR vaccine.  Pneumococcal 13-valent conjugate (PCV13) vaccine. When indicated, a person who is uncertain of her immunization history and has no record of immunization should receive the PCV13 vaccine. An adult  aged 74 years or older who has certain medical conditions and has not been previously immunized should receive 1 dose of PCV13 vaccine. This PCV13 should be followed with a dose of pneumococcal polysaccharide (PPSV23) vaccine. The PPSV23 vaccine dose should be obtained at least 8 weeks after the dose of PCV13 vaccine. An adult aged 53 years or older who has certain medical conditions and previously received 1 or more doses of PPSV23 vaccine should receive 1 dose of PCV13. The PCV13 vaccine dose should be obtained 1 or more years after the last PPSV23 vaccine dose.  Pneumococcal polysaccharide (PPSV23) vaccine. When PCV13 is also indicated, PCV13 should be obtained first. All adults aged 25 years and older should be immunized. An adult younger than age 79 years who has certain medical conditions should be immunized. Any person who resides in a nursing home or long-term care facility should be immunized. An adult smoker should be immunized. People with an immunocompromised condition and certain other conditions should receive both PCV13 and PPSV23 vaccines. People with human immunodeficiency virus (HIV) infection should be immunized as soon as possible after diagnosis. Immunization during chemotherapy or radiation therapy should be avoided. Routine use of PPSV23 vaccine is not recommended for American Indians, [place] Natives, or people younger than 65 years unless there are medical conditions that require PPSV23 vaccine. When indicated, people who have unknown immunization and have no record of immunization should receive PPSV23 vaccine. One-time revaccination 5 years after the first dose of PPSV23 is recommended for people aged 19-64 years who have chronic kidney failure, nephrotic syndrome, asplenia, or immunocompromised conditions. People who received 1-2 doses of PPSV23 before age 61 years should receive another dose of PPSV23 vaccine at age 31 years or later if  at least 5 years have passed since the previous  dose. Doses of PPSV23 are not needed for people immunized with PPSV23 at or after age 46 years.  Meningococcal vaccine. Adults with asplenia or persistent complement component deficiencies should receive 2 doses of quadrivalent meningococcal conjugate (MenACWY-D) vaccine. The doses should be obtained at least 2 months apart. Microbiologists working with certain meningococcal bacteria, [place] recruits, people at risk during an outbreak, and people who travel to or live in countries with a high rate of meningitis should be immunized. A first-year college student up through age 76 years who is living in a residence hall should receive a dose if she did not receive a dose on or after her 16th birthday. Adults who have certain high-risk conditions should receive one or more doses of vaccine.  Hepatitis A vaccine. Adults who wish to be protected from this disease, have certain high-risk conditions, work with hepatitis A-infected animals, work in hepatitis A research labs, or travel to or work in countries with a high rate of hepatitis A should be immunized. Adults who were previously unvaccinated and who anticipate close contact with an international adoptee during the first 60 days after arrival in the [place] from a country with a high rate of hepatitis A should be immunized.  Hepatitis B vaccine. Adults who wish to be protected from this disease, have certain high-risk conditions, may be exposed to blood or other infectious body fluids, are household contacts or sex partners of hepatitis B positive people, are clients or workers in certain care facilities, or travel to or work in countries with a high rate of hepatitis B should be immunized.  Haemophilus influenzae type b (Hib) vaccine. A previously unvaccinated person with asplenia or sickle cell disease or having a scheduled splenectomy should receive 1 dose of Hib vaccine. Regardless of previous immunization, a recipient of a hematopoietic stem cell  transplant should receive a 3-dose series 6-12 months after her successful transplant. Hib vaccine is not recommended for adults with HIV infection. Preventive Services / Frequency Ages 67 to 70 years  Blood pressure check.** / Every 1 to 2 years.  Lipid and cholesterol check.** / Every 5 years beginning at age 80.  Clinical breast exam.** / Every 3 years for women in their 66s and 71s.  BRCA-related cancer risk assessment.** / For women who have family members with a BRCA-related cancer (breast, ovarian, tubal, or peritoneal cancers).  Pap test.** / Every 2 years from ages 77 through 25. Every 3 years starting at age 58 through age 46 or 74 with a history of 3 consecutive normal Pap tests.  HPV screening.** / Every 3 years from ages 67 through ages 9 to 54 with a history of 3 consecutive normal Pap tests.  Hepatitis C blood test.** / For any individual with known risks for hepatitis C.  Skin self-exam. / Monthly.  Influenza vaccine. / Every year.  Tetanus, diphtheria, and acellular pertussis (Tdap, Td) vaccine.** / Consult your health care provider. Pregnant women should receive 1 dose of Tdap vaccine during each pregnancy. 1 dose of Td every 10 years.  Varicella vaccine.** / Consult your health care provider. Pregnant females who do not have evidence of immunity should receive the first dose after pregnancy.  HPV vaccine. / 3 doses over 6 months, if 57 and younger. The vaccine is not recommended for use in pregnant females. However, pregnancy testing is not needed before receiving a dose.  Measles, mumps, rubella (MMR) vaccine.** / You need  at least 1 dose of MMR if you were born in 1957 or later. You may also need a 2nd dose. For females of childbearing age, rubella immunity should be determined. If there is no evidence of immunity, females who are not pregnant should be vaccinated. If there is no evidence of immunity, females who are pregnant should delay immunization until after  pregnancy.  Pneumococcal 13-valent conjugate (PCV13) vaccine.** / Consult your health care provider.  Pneumococcal polysaccharide (PPSV23) vaccine.** / 1 to 2 doses if you smoke cigarettes or if you have certain conditions.  Meningococcal vaccine.** / 1 dose if you are age 27 to 78 years and a Market researcher living in a residence hall, or have one of several medical conditions, you need to get vaccinated against meningococcal disease. You may also need additional booster doses.  Hepatitis A vaccine.** / Consult your health care provider.  Hepatitis B vaccine.** / Consult your health care provider.  Haemophilus influenzae type b (Hib) vaccine.** / Consult your health care provider. Ages 86 to 75 years  Blood pressure check.** / Every 1 to 2 years.  Lipid and cholesterol check.** / Every 5 years beginning at age 69 years.  Lung cancer screening. / Every year if you are aged 28-80 years and have a 30-pack-year history of smoking and currently smoke or have quit within the past 15 years. Yearly screening is stopped once you have quit smoking for at least 15 years or develop a health problem that would prevent you from having lung cancer treatment.  Clinical breast exam.** / Every year after age 45 years.  BRCA-related cancer risk assessment.** / For women who have family members with a BRCA-related cancer (breast, ovarian, tubal, or peritoneal cancers).  Mammogram.** / Every year beginning at age 71 years and continuing for as long as you are in good health. Consult with your health care provider.  Pap test.** / Every 3 years starting at age 67 years through age 4 or 11 years with a history of 3 consecutive normal Pap tests.  HPV screening.** / Every 3 years from ages 8 years through ages 8 to 47 years with a history of 3 consecutive normal Pap tests.  Fecal occult blood test (FOBT) of stool. / Every year beginning at age 26 years and continuing until age 72 years. You may  not need to do this test if you get a colonoscopy every 10 years.  Flexible sigmoidoscopy or colonoscopy.** / Every 5 years for a flexible sigmoidoscopy or every 10 years for a colonoscopy beginning at age 38 years and continuing until age 12 years.  Hepatitis C blood test.** / For all people born from [birth year] through 1963 and any individual with known risks for hepatitis C.  Skin self-exam. / Monthly.  Influenza vaccine. / Every year.  Tetanus, diphtheria, and acellular pertussis (Tdap/Td) vaccine.** / Consult your health care provider. Pregnant women should receive 1 dose of Tdap vaccine during each pregnancy. 1 dose of Td every 10 years.  Varicella vaccine.** / Consult your health care provider. Pregnant females who do not have evidence of immunity should receive the first dose after pregnancy.  Zoster vaccine.** / 1 dose for adults aged 80 years or older.  Measles, mumps, rubella (MMR) vaccine.** / You need at least 1 dose of MMR if you were born in 1957 or later. You may also need a 2nd dose. For females of childbearing age, rubella immunity should be determined. If there is no evidence of immunity, females who  are not pregnant should be vaccinated. If there is no evidence of immunity, females who are pregnant should delay immunization until after pregnancy.  Pneumococcal 13-valent conjugate (PCV13) vaccine.** / Consult your health care provider.  Pneumococcal polysaccharide (PPSV23) vaccine.** / 1 to 2 doses if you smoke cigarettes or if you have certain conditions.  Meningococcal vaccine.** / Consult your health care provider.  Hepatitis A vaccine.** / Consult your health care provider.  Hepatitis B vaccine.** / Consult your health care provider.  Haemophilus influenzae type b (Hib) vaccine.** / Consult your health care provider. Ages 11 years and over  Blood pressure check.** / Every 1 to 2 years.  Lipid and cholesterol check.** / Every 5 years beginning at age 36 years.  Lung  cancer screening. / Every year if you are aged 73-80 years and have a 30-pack-year history of smoking and currently smoke or have quit within the past 15 years. Yearly screening is stopped once you have quit smoking for at least 15 years or develop a health problem that would prevent you from having lung cancer treatment.  Clinical breast exam.** / Every year after age 29 years.  BRCA-related cancer risk assessment.** / For women who have family members with a BRCA-related cancer (breast, ovarian, tubal, or peritoneal cancers).  Mammogram.** / Every year beginning at age 18 years and continuing for as long as you are in good health. Consult with your health care provider.  Pap test.** / Every 3 years starting at age 7 years through age 32 or 58 years with 3 consecutive normal Pap tests. Testing can be stopped between 65 and 70 years with 3 consecutive normal Pap tests and no abnormal Pap or HPV tests in the past 10 years.  HPV screening.** / Every 3 years from ages 33 years through ages 61 or 77 years with a history of 3 consecutive normal Pap tests. Testing can be stopped between 65 and 70 years with 3 consecutive normal Pap tests and no abnormal Pap or HPV tests in the past 10 years.  Fecal occult blood test (FOBT) of stool. / Every year beginning at age 27 years and continuing until age 34 years. You may not need to do this test if you get a colonoscopy every 10 years.  Flexible sigmoidoscopy or colonoscopy.** / Every 5 years for a flexible sigmoidoscopy or every 10 years for a colonoscopy beginning at age 60 years and continuing until age 59 years.  Hepatitis C blood test.** / For all people born from [birth year] through 1963 and any individual with known risks for hepatitis C.  Osteoporosis screening.** / A one-time screening for women ages 53 years and over and women at risk for fractures or osteoporosis.  Skin self-exam. / Monthly.  Influenza vaccine. / Every year.  Tetanus, diphtheria, and  acellular pertussis (Tdap/Td) vaccine.** / 1 dose of Td every 10 years.  Varicella vaccine.** / Consult your health care provider.  Zoster vaccine.** / 1 dose for adults aged 38 years or older.  Pneumococcal 13-valent conjugate (PCV13) vaccine.** / Consult your health care provider.  Pneumococcal polysaccharide (PPSV23) vaccine.** / 1 dose for all adults aged 8 years and older.  Meningococcal vaccine.** / Consult your health care provider.  Hepatitis A vaccine.** / Consult your health care provider.  Hepatitis B vaccine.** / Consult your health care provider.  Haemophilus influenzae type b (Hib) vaccine.** / Consult your health care provider. ** Family history and personal history of risk and conditions may change your health care provider's  recommendations. Document Released: 08/27/2001 Document Revised: 11/15/2013 Document Reviewed: 11/26/2010 [facility] Patient Information 2015 [place], [place]. This information is not intended to replace advice given to you by your health care provider. Make sure you discuss any questions you have with your health care provider.

## 2014-03-24 NOTE — Progress Notes (Signed)
Subjective:    Patient ID: Natalie Goodman, female    DOB: 1925/07/15, 80 y.o.   MRN: [id number]  Hypertension This is a chronic problem. The current episode started more than 1 year ago. The problem is unchanged. The problem is controlled. Pertinent negatives include no anxiety, blurred vision, chest pain, headaches, malaise/fatigue, neck pain, orthopnea, palpitations, peripheral edema, PND, shortness of breath or sweats. Risk factors for coronary artery disease include obesity. Past treatments include angiotensin blockers, beta blockers and diuretics. The current treatment provides moderate improvement. Compliance problems include diet and exercise.  Hypertensive end-organ damage includes kidney disease. Identifiable causes of hypertension include chronic renal disease.      Review of Systems  Constitutional: Negative.  Negative for fever, chills, malaise/fatigue, diaphoresis, appetite change and fatigue.  HENT: Negative.   Eyes: Negative.  Negative for blurred vision.  Respiratory: Negative.  Negative for apnea, cough, choking, chest tightness, shortness of breath, wheezing and stridor.   Cardiovascular: Negative.  Negative for chest pain, palpitations, orthopnea, leg swelling and PND.  Gastrointestinal: Negative.  Negative for nausea, vomiting, abdominal pain, diarrhea, constipation and blood in stool.  Endocrine: Negative.   Genitourinary: Negative.   Musculoskeletal: Negative.  Negative for arthralgias, back pain, joint swelling, myalgias and neck pain.  Skin: Negative.  Negative for rash.  Allergic/Immunologic: Negative.   Neurological: Negative.  Negative for dizziness, tremors, seizures, syncope, facial asymmetry, speech difficulty, weakness, light-headedness, numbness and headaches.  Hematological: Negative.  Negative for adenopathy. Does not bruise/bleed easily.  Psychiatric/Behavioral: Negative.        Objective:   Physical Exam  Vitals reviewed. Constitutional: She is oriented  to person, place, and time. She appears well-developed and well-nourished. No distress.  HENT:  Head: Normocephalic and atraumatic.  Mouth/Throat: Oropharynx is clear and moist. No oropharyngeal exudate.  Eyes: Conjunctivae are normal. Right eye exhibits no discharge. Left eye exhibits no discharge. No scleral icterus.  Neck: Normal range of motion. Neck supple. No JVD present. No tracheal deviation present. No thyromegaly present.  Cardiovascular: Normal rate, regular rhythm, normal heart sounds and intact distal pulses.  Exam reveals no gallop and no friction rub.   No murmur heard. Pulmonary/Chest: Effort normal and breath sounds normal. No stridor. No respiratory distress. She has no wheezes. She has no rales. She exhibits no tenderness.  Abdominal: Soft. Bowel sounds are normal. She exhibits no distension and no mass. There is no tenderness. There is no rebound and no guarding.  Musculoskeletal: Normal range of motion. She exhibits no edema and no tenderness.  Lymphadenopathy:    She has no cervical adenopathy.  Neurological: She is oriented to person, place, and time.  Skin: Skin is warm and dry. No rash noted. She is not diaphoretic. No erythema. No pallor.  Psychiatric: She has a normal mood and affect. Her behavior is normal. Judgment and thought content normal.    Lab Results  Component Value Date   WBC 10.1 10/29/2013   HGB 10.7* 10/29/2013   HCT 34.3* 10/29/2013   PLT 220 10/29/2013   GLUCOSE 94 10/29/2013   CHOL 193 02/15/2013   TRIG 124.0 02/15/2013   HDL 42.70 02/15/2013   LDLDIRECT 144.3 12/04/2011   LDLCALC 126* 02/15/2013   ALT 10 04/20/2013   AST 19 04/20/2013   NA 141 10/29/2013   K 4.8 10/29/2013   CL 104 10/29/2013   CREATININE 1.27* 10/29/2013   BUN 30* 10/29/2013   CO2 24 10/29/2013   TSH 2.64 03/10/2013  Assessment & Plan:

## 2014-03-27 DIAGNOSIS — Z0000 Encounter for general adult medical examination without abnormal findings: Secondary | ICD-10-CM | POA: Insufficient documentation

## 2014-03-27 NOTE — Assessment & Plan Note (Addendum)
The patient is here for annual Medicare wellness examination and management of other chronic and acute problems.   The risk factors are reflected in the social history.  The roster of all physicians providing medical care to patient - is listed in the Snapshot section of the chart.  Activities of daily living:  The patient is 100% inedpendent in all ADLs: dressing, toileting, feeding as well as independent mobility  Home safety : The patient has smoke detectors in the home. They wear seatbelts.No firearms at home ( firearms are present in the home, kept in a safe fashion). There is no violence in the home.   There is no risks for hepatitis, STDs or HIV. There is no   history of blood transfusion. They have no travel history to infectious disease endemic areas of the world.  The patient has (has not) seen their dentist in the last six month. They have (not) seen their eye doctor in the last year. They deny (admit to) any hearing difficulty and have not had audiologic testing in the last year.  They do not  have excessive sun exposure. Discussed the need for sun protection: hats, long sleeves and use of sunscreen if there is significant sun exposure.   Diet: the importance of a healthy diet is discussed. They do have a healthy (unhealthy-high fat/fast food) diet.  The benefits of regular aerobic exercise were discussed.  Depression screen: there are no signs or vegative symptoms of depression- irritability, change in appetite, anhedonia, sadness/tearfullness.  Cognitive assessment: the patient manages all their financial and personal affairs and is actively engaged. They could relate day,date,year and events; recalled 3/3 objects at 3 minutes; performed clock-face test normally.  The following portions of the patient's history were reviewed and updated as appropriate: allergies, current medications, past family history, past medical history,  past surgical history, past social history  and  problem list.  Vision, hearing, body mass index were assessed and reviewed.   During the course of the visit the patient was educated and counseled about appropriate screening and preventive services including : fall prevention , diabetes screening, nutrition counseling, colorectal cancer screening, and recommended immunizations.  

## 2014-03-27 NOTE — Assessment & Plan Note (Signed)
She has achieved her LDL goal and is doing well on crestor 

## 2014-03-27 NOTE — Assessment & Plan Note (Signed)
This has resolved.

## 2014-03-27 NOTE — Assessment & Plan Note (Signed)
Her renal function is stable 

## 2014-03-27 NOTE — Assessment & Plan Note (Signed)
Her BP is well controlled Her lytes and renal function are stable 

## 2014-04-21 ENCOUNTER — Other Ambulatory Visit: Payer: Self-pay

## 2014-04-21 MED ORDER — METOPROLOL TARTRATE 50 MG PO TABS: ORAL_TABLET | ORAL | Status: DC

## 2014-04-27 ENCOUNTER — Other Ambulatory Visit: Payer: Self-pay | Admitting: *Deleted

## 2014-04-27 MED ORDER — FUROSEMIDE 20 MG PO TABS: ORAL_TABLET | ORAL | Status: DC

## 2014-06-02 ENCOUNTER — Telehealth: Payer: Self-pay | Admitting: Internal Medicine

## 2014-06-02 MED ORDER — METOPROLOL TARTRATE 50 MG PO TABS: ORAL_TABLET | ORAL | Status: DC

## 2014-06-02 NOTE — Telephone Encounter (Signed)
Pt called in and said that she needs refill on   metoprolol (LOPRESSOR) 50 MG tablet [[id number]][[id number]]    She needs to change her pharmacy to the below:     [facility]  [address] [place]  [phone]

## 2014-07-01 ENCOUNTER — Other Ambulatory Visit: Payer: Self-pay

## 2014-07-01 MED ORDER — VALSARTAN 160 MG PO TABS: 160.0000 mg | ORAL_TABLET | Freq: Every day | ORAL | Status: DC

## 2014-07-01 MED ORDER — ROSUVASTATIN CALCIUM 10 MG PO TABS: 10.0000 mg | ORAL_TABLET | Freq: Every day | ORAL | Status: DC

## 2014-08-12 ENCOUNTER — Other Ambulatory Visit: Payer: Self-pay

## 2014-08-12 MED ORDER — VALSARTAN 160 MG PO TABS: 160.0000 mg | ORAL_TABLET | Freq: Every day | ORAL | Status: DC

## 2014-08-12 MED ORDER — ROSUVASTATIN CALCIUM 10 MG PO TABS: 10.0000 mg | ORAL_TABLET | Freq: Every day | ORAL | Status: DC

## 2014-09-16 ENCOUNTER — Other Ambulatory Visit: Payer: Self-pay | Admitting: *Deleted

## 2014-09-16 MED ORDER — ROSUVASTATIN CALCIUM 10 MG PO TABS: 10.0000 mg | ORAL_TABLET | Freq: Every day | ORAL | Status: DC

## 2014-09-20 ENCOUNTER — Other Ambulatory Visit: Payer: Self-pay

## 2014-09-20 MED ORDER — METOPROLOL TARTRATE 50 MG PO TABS: ORAL_TABLET | ORAL | Status: DC

## 2014-10-13 ENCOUNTER — Other Ambulatory Visit: Payer: Self-pay

## 2014-10-13 MED ORDER — ROSUVASTATIN CALCIUM 10 MG PO TABS: 10.0000 mg | ORAL_TABLET | Freq: Every day | ORAL | Status: DC

## 2014-10-14 ENCOUNTER — Telehealth: Payer: Self-pay

## 2014-10-14 NOTE — Telephone Encounter (Signed)
error 

## 2014-10-28 NOTE — Progress Notes (Signed)
      HPI: FU coronary disease. She was previously seen by Dr. Mayford Knifeurner. She apparently had a myocardial infarction in the past. She does not recall stents or catheterization. Echocardiogram in August of 2013 showed an ejection fraction of 50-55%. There was mild left atrial enlargement and mild mitral regurgitation. Nuclear study in August of 2013 showed an ejection fraction of 61%. There was a scar in the inferior and lateral wall and moderate ischemia as well. I reviewed the images and felt the ischemia was mild to moderate. She was not having symptoms and we have elected to treat this medically. Chest CT April 2015 showed 4 mm nodules periphery of the right middle lobe and left lower lobe. Follow-up recommended 1 year. Since I last saw her, she denies dyspnea, chest pain, palpitations or syncope.  Current Outpatient Prescriptions  Medication Sig Dispense Refill  . aspirin 325 MG tablet Take 325 mg by mouth daily.    . furosemide (LASIX) 20 MG tablet TAKE ONE TABLET BY MOUTH ONCE DAILY 90 tablet 0  . metoprolol (LOPRESSOR) 50 MG tablet TAKE ONE TABLET BY MOUTH TWICE DAILY 60 tablet 11  . rosuvastatin (CRESTOR) 10 MG tablet Take 1 tablet (10 mg total) by mouth daily. 30 tablet 2  . valsartan (DIOVAN) 160 MG tablet Take 1 tablet (160 mg total) by mouth daily. 30 tablet 0   No current facility-administered medications for this visit.     Past Medical History  Diagnosis Date  . Hypertension   . Hyperlipidemia   . CAD (coronary artery disease)   . MYOCARDIAL INFARCTION, HX OF   . GERD   . KIDNEY DISEASE, CHRONIC, STAGE I   . OSTEOARTHRITIS   . PARESTHESIA   . ESOPHAGEAL STRICTURE   . Anemia     Past Surgical History  Procedure Laterality Date  . Abdominal hysterectomy    . Revision total knee arthroplasty      Bilateral  . Tonsillectomy      History   Social History  . Marital Status: Widowed    Spouse Name: N/A  . Number of Children: N/A  . Years of Education: N/A    Occupational History  . Not on file.   Social History Main Topics  . Smoking status: Never Smoker   . Smokeless tobacco: Never Used  . Alcohol Use: No  . Drug Use: No  . Sexual Activity: Not Currently   Other Topics Concern  . Not on file   Social History Narrative    ROS: some hand tingling but no fevers or chills, productive cough, hemoptysis, dysphasia, odynophagia, melena, hematochezia, dysuria, hematuria, rash, seizure activity, orthopnea, PND, claudication. Remaining systems are negative.  Physical Exam: Well-developed obese in no acute distress.  Skin is warm and dry.  HEENT is normal.  Neck is supple.  Chest is clear to auscultation with normal expansion.  Cardiovascular exam is regular rate and rhythm.  Abdominal exam nontender or distended. No masses palpated. Extremities show no edema. neuro grossly intact  ECG sinus rhythm at a rate of 62. No ST changes.

## 2014-10-31 ENCOUNTER — Ambulatory Visit (INDEPENDENT_AMBULATORY_CARE_PROVIDER_SITE_OTHER): Payer: Commercial Managed Care - HMO | Admitting: Cardiology

## 2014-10-31 ENCOUNTER — Encounter: Payer: Self-pay | Admitting: Cardiology

## 2014-10-31 VITALS — BP 132/68 | HR 62 | Ht 65.0 in | Wt 240.2 lb

## 2014-10-31 DIAGNOSIS — I2510 Atherosclerotic heart disease of native coronary artery without angina pectoris: Secondary | ICD-10-CM

## 2014-10-31 DIAGNOSIS — E785 Hyperlipidemia, unspecified: Secondary | ICD-10-CM | POA: Diagnosis not present

## 2014-10-31 DIAGNOSIS — R911 Solitary pulmonary nodule: Secondary | ICD-10-CM | POA: Diagnosis not present

## 2014-10-31 LAB — BASIC METABOLIC PANEL WITH GFR
BUN: 22 mg/dL (ref 6–23)
CHLORIDE: 107 meq/L (ref 96–112)
CO2: 27 mEq/L (ref 19–32)
Calcium: 9.3 mg/dL (ref 8.4–10.5)
Creat: 1.08 mg/dL (ref 0.50–1.10)
GFR, EST NON AFRICAN AMERICAN: 46 mL/min — AB
GFR, Est African American: 53 mL/min — ABNORMAL LOW
Glucose, Bld: 93 mg/dL (ref 70–99)
POTASSIUM: 4.9 meq/L (ref 3.5–5.3)
Sodium: 140 mEq/L (ref 135–145)

## 2014-10-31 LAB — HEPATIC FUNCTION PANEL
ALT: 14 U/L (ref 0–35)
AST: 21 U/L (ref 0–37)
Albumin: 3.7 g/dL (ref 3.5–5.2)
Alkaline Phosphatase: 81 U/L (ref 39–117)
Bilirubin, Direct: 0.2 mg/dL (ref 0.0–0.3)
Indirect Bilirubin: 0.7 mg/dL (ref 0.2–1.2)
TOTAL PROTEIN: 7.3 g/dL (ref 6.0–8.3)
Total Bilirubin: 0.9 mg/dL (ref 0.2–1.2)

## 2014-10-31 LAB — LIPID PANEL
CHOL/HDL RATIO: 2.5 ratio
Cholesterol: 144 mg/dL (ref 0–200)
HDL: 58 mg/dL (ref >=46)
LDL CALC: 68 mg/dL (ref 0–99)
Triglycerides: 92 mg/dL (ref <150)
VLDL: 18 mg/dL (ref 0–40)

## 2014-10-31 NOTE — Assessment & Plan Note (Signed)
Repeat noncontrast chest CT. 

## 2014-10-31 NOTE — Assessment & Plan Note (Signed)
Continue aspirin and statin. Again discussed options today including medical therapy versus cardiac catheterization. She would like conservative measures and I think this is appropriate. She's not having dyspnea or chest pain. We will reconsider this if she develops symptoms. Continue beta blocker.

## 2014-10-31 NOTE — Assessment & Plan Note (Signed)
Continue statin. Check lipids and liver. 

## 2014-10-31 NOTE — Assessment & Plan Note (Signed)
Continue present blood pressure medications. Check potassium and renal function. 

## 2014-10-31 NOTE — Patient Instructions (Signed)
Your physician wants you to follow-up in: ONE YEAR WITH DR Shelda PalRENSHAW You will receive a reminder letter in the mail two months in advance. If you don't receive a letter, please call our office to schedule the follow-up appointment.   Non-Cardiac CT scanning, (CAT scanning), is a noninvasive, special x-ray that produces cross-sectional images of the body using x-rays and a computer. CT scans help physicians diagnose and treat medical conditions. For some CT exams, a contrast material is used to enhance visibility in the area of the body being studied. CT scans provide greater clarity and reveal more details than regular x-ray exams.[facility]  Your physician recommends that you HAVE LAB WORK TODAY

## 2014-11-01 ENCOUNTER — Ambulatory Visit: Payer: Commercial Managed Care - HMO | Admitting: Internal Medicine

## 2014-11-01 DIAGNOSIS — Z0289 Encounter for other administrative examinations: Secondary | ICD-10-CM

## 2014-11-02 ENCOUNTER — Encounter: Payer: Self-pay | Admitting: *Deleted

## 2014-11-04 ENCOUNTER — Telehealth: Payer: Self-pay | Admitting: Cardiology

## 2014-11-04 ENCOUNTER — Inpatient Hospital Stay: Admission: RE | Admit: 2014-11-04 | Payer: Commercial Managed Care - HMO | Source: Ambulatory Visit

## 2014-11-04 NOTE — Telephone Encounter (Signed)
Calling bc pt presented for labs. Advised no current labs, labwork 4 days ago resulted/normal, no indication for redraw. Understanding verbalized.

## 2014-11-08 ENCOUNTER — Ambulatory Visit (INDEPENDENT_AMBULATORY_CARE_PROVIDER_SITE_OTHER)
Admission: RE | Admit: 2014-11-08 | Discharge: 2014-11-08 | Disposition: A | Discharge status: Home / Self Care | Payer: Commercial Managed Care - HMO | Source: Ambulatory Visit | Attending: Cardiology | Admitting: Cardiology

## 2014-11-08 DIAGNOSIS — J984 Other disorders of lung: Secondary | ICD-10-CM | POA: Diagnosis not present

## 2014-11-08 DIAGNOSIS — R918 Other nonspecific abnormal finding of lung field: Secondary | ICD-10-CM | POA: Diagnosis not present

## 2014-11-08 DIAGNOSIS — R911 Solitary pulmonary nodule: Secondary | ICD-10-CM

## 2014-12-27 ENCOUNTER — Other Ambulatory Visit: Payer: Self-pay

## 2014-12-27 MED ORDER — ROSUVASTATIN CALCIUM 10 MG PO TABS: 10.0000 mg | ORAL_TABLET | Freq: Every day | ORAL | Status: DC

## 2014-12-27 MED ORDER — VALSARTAN 160 MG PO TABS: 160.0000 mg | ORAL_TABLET | Freq: Every day | ORAL | Status: DC

## 2015-04-19 ENCOUNTER — Other Ambulatory Visit: Payer: Self-pay | Admitting: Cardiology

## 2015-04-19 MED ORDER — FUROSEMIDE 20 MG PO TABS: ORAL_TABLET | ORAL | Status: DC

## 2015-08-23 ENCOUNTER — Encounter: Payer: Self-pay | Admitting: Gastroenterology

## 2015-08-24 ENCOUNTER — Encounter: Payer: Self-pay | Admitting: Gastroenterology

## 2015-08-25 ENCOUNTER — Encounter: Payer: Self-pay | Admitting: Gastroenterology

## 2015-09-13 ENCOUNTER — Encounter: Payer: Self-pay | Admitting: Gastroenterology

## 2015-09-15 ENCOUNTER — Other Ambulatory Visit: Payer: Self-pay

## 2015-10-12 ENCOUNTER — Other Ambulatory Visit: Payer: Self-pay | Admitting: *Deleted

## 2015-10-12 MED ORDER — ROSUVASTATIN CALCIUM 10 MG PO TABS: 10.0000 mg | ORAL_TABLET | Freq: Every day | ORAL | Status: DC

## 2015-10-19 ENCOUNTER — Other Ambulatory Visit: Payer: Self-pay | Admitting: *Deleted

## 2015-10-19 MED ORDER — FUROSEMIDE 20 MG PO TABS: ORAL_TABLET | ORAL | Status: DC

## 2015-11-07 ENCOUNTER — Ambulatory Visit: Payer: Commercial Managed Care - HMO | Admitting: Internal Medicine

## 2015-11-08 ENCOUNTER — Telehealth: Payer: Self-pay | Admitting: Internal Medicine

## 2015-11-08 NOTE — Telephone Encounter (Signed)
Patient no showed for med fu/ OV/ last seen 2015 on 4/25.  Please advise.

## 2015-11-29 ENCOUNTER — Encounter: Payer: Self-pay | Admitting: Internal Medicine

## 2015-11-29 ENCOUNTER — Other Ambulatory Visit (INDEPENDENT_AMBULATORY_CARE_PROVIDER_SITE_OTHER): Payer: Commercial Managed Care - HMO

## 2015-11-29 ENCOUNTER — Ambulatory Visit (INDEPENDENT_AMBULATORY_CARE_PROVIDER_SITE_OTHER): Payer: Commercial Managed Care - HMO | Admitting: Internal Medicine

## 2015-11-29 VITALS — BP 118/60 | HR 60 | Temp 98.0°F | Resp 16 | Ht 65.0 in | Wt 238.0 lb

## 2015-11-29 DIAGNOSIS — Z23 Encounter for immunization: Secondary | ICD-10-CM | POA: Diagnosis not present

## 2015-11-29 DIAGNOSIS — N181 Chronic kidney disease, stage 1: Secondary | ICD-10-CM

## 2015-11-29 DIAGNOSIS — D539 Nutritional anemia, unspecified: Secondary | ICD-10-CM | POA: Diagnosis not present

## 2015-11-29 DIAGNOSIS — R209 Unspecified disturbances of skin sensation: Secondary | ICD-10-CM

## 2015-11-29 DIAGNOSIS — I10 Essential (primary) hypertension: Secondary | ICD-10-CM

## 2015-11-29 DIAGNOSIS — I2510 Atherosclerotic heart disease of native coronary artery without angina pectoris: Secondary | ICD-10-CM

## 2015-11-29 DIAGNOSIS — Z0000 Encounter for general adult medical examination without abnormal findings: Secondary | ICD-10-CM

## 2015-11-29 DIAGNOSIS — R911 Solitary pulmonary nodule: Secondary | ICD-10-CM

## 2015-11-29 LAB — CBC WITH DIFFERENTIAL/PLATELET
BASOS PCT: 0.4 % (ref 0.0–3.0)
Basophils Absolute: 0 10*3/uL (ref 0.0–0.1)
EOS ABS: 0.5 10*3/uL (ref 0.0–0.7)
Eosinophils Relative: 4.4 % (ref 0.0–5.0)
HCT: 34.5 % — ABNORMAL LOW (ref 36.0–46.0)
Hemoglobin: 11.3 g/dL — ABNORMAL LOW (ref 12.0–15.0)
LYMPHS ABS: 1.4 10*3/uL (ref 0.7–4.0)
Lymphocytes Relative: 13.2 % (ref 12.0–46.0)
MCHC: 32.7 g/dL (ref 30.0–36.0)
MCV: 92.2 fl (ref 78.0–100.0)
MONO ABS: 1.2 10*3/uL — AB (ref 0.1–1.0)
Monocytes Relative: 11 % (ref 3.0–12.0)
NEUTROS ABS: 7.7 10*3/uL (ref 1.4–7.7)
Neutrophils Relative %: 71 % (ref 43.0–77.0)
PLATELETS: 231 10*3/uL (ref 150.0–400.0)
RBC: 3.74 Mil/uL — ABNORMAL LOW (ref 3.87–5.11)
RDW: 14.2 % (ref 11.5–15.5)
WBC: 10.8 10*3/uL — ABNORMAL HIGH (ref 4.0–10.5)

## 2015-11-29 LAB — LIPID PANEL
CHOLESTEROL: 134 mg/dL (ref 0–200)
HDL: 43.4 mg/dL (ref >39.00)
LDL CALC: 74 mg/dL (ref 0–99)
NonHDL: 91.05
TRIGLYCERIDES: 86 mg/dL (ref 0.0–149.0)
Total CHOL/HDL Ratio: 3
VLDL: 17.2 mg/dL (ref 0.0–40.0)

## 2015-11-29 LAB — URINALYSIS, ROUTINE W REFLEX MICROSCOPIC
BILIRUBIN URINE: NEGATIVE
Hgb urine dipstick: NEGATIVE
KETONES UR: NEGATIVE
NITRITE: NEGATIVE
SPECIFIC GRAVITY, URINE: 1.02 (ref 1.000–1.030)
Total Protein, Urine: NEGATIVE
Urine Glucose: NEGATIVE
Urobilinogen, UA: 0.2 (ref 0.0–1.0)
pH: 5.5 (ref 5.0–8.0)

## 2015-11-29 LAB — VITAMIN B12: VITAMIN B 12: 476 pg/mL (ref 211–911)

## 2015-11-29 LAB — COMPREHENSIVE METABOLIC PANEL
ALBUMIN: 3.7 g/dL (ref 3.5–5.2)
ALT: 12 U/L (ref 0–35)
AST: 20 U/L (ref 0–37)
Alkaline Phosphatase: 73 U/L (ref 39–117)
BUN: 34 mg/dL — AB (ref 6–23)
CHLORIDE: 111 meq/L (ref 96–112)
CO2: 25 meq/L (ref 19–32)
CREATININE: 1.27 mg/dL — AB (ref 0.40–1.20)
Calcium: 9.4 mg/dL (ref 8.4–10.5)
GFR: 50.96 mL/min — ABNORMAL LOW (ref >60.00)
GLUCOSE: 95 mg/dL (ref 70–99)
Potassium: 4.8 mEq/L (ref 3.5–5.1)
SODIUM: 140 meq/L (ref 135–145)
Total Bilirubin: 0.8 mg/dL (ref 0.2–1.2)
Total Protein: 7.6 g/dL (ref 6.0–8.3)

## 2015-11-29 LAB — IBC PANEL
IRON: 69 ug/dL (ref 42–145)
Saturation Ratios: 17.3 % — ABNORMAL LOW (ref 20.0–50.0)
TRANSFERRIN: 285 mg/dL (ref 212.0–360.0)

## 2015-11-29 LAB — FOLATE: Folate: >23.3 ng/mL (ref >5.9)

## 2015-11-29 LAB — FERRITIN: Ferritin: 35.6 ng/mL (ref 10.0–291.0)

## 2015-11-29 LAB — TSH: TSH: 4.94 u[IU]/mL — ABNORMAL HIGH (ref 0.35–4.50)

## 2015-11-29 MED ORDER — METOPROLOL TARTRATE 50 MG PO TABS: ORAL_TABLET | ORAL | Status: DC

## 2015-11-29 NOTE — Patient Instructions (Signed)
Preventive Care for Adults, Female A healthy lifestyle and preventive care can promote health and wellness. Preventive health guidelines for women include the following key practices.  A routine yearly physical is a good way to check with your health care provider about your health and preventive screening. It is a chance to share any concerns and updates on your health and to receive a thorough exam.  Visit your dentist for a routine exam and preventive care every 6 months. Brush your teeth twice a day and floss once a day. Good oral hygiene prevents tooth decay and gum disease.  The frequency of eye exams is based on your age, health, family medical history, use of contact lenses, and other factors. Follow your health care provider's recommendations for frequency of eye exams.  Eat a healthy diet. Foods like vegetables, fruits, whole grains, low-fat dairy products, and lean protein foods contain the nutrients you need without too many calories. Decrease your intake of foods high in solid fats, added sugars, and salt. Eat the right amount of calories for you.Get information about a proper diet from your health care provider, if necessary.  Regular physical exercise is one of the most important things you can do for your health. Most adults should get at least 150 minutes of moderate-intensity exercise (any activity that increases your heart rate and causes you to sweat) each week. In addition, most adults need muscle-strengthening exercises on 2 or more days a week.  Maintain a healthy weight. The body mass index (BMI) is a screening tool to identify possible weight problems. It provides an estimate of body fat based on height and weight. Your health care provider can find your BMI and can help you achieve or maintain a healthy weight.For adults 20 years and older:  A BMI below 18.5 is considered underweight.  A BMI of 18.5 to 24.9 is normal.  A BMI of 25 to 29.9 is considered overweight.  A  BMI of 30 and above is considered obese.  Maintain normal blood lipids and cholesterol levels by exercising and minimizing your intake of saturated fat. Eat a balanced diet with plenty of fruit and vegetables. Blood tests for lipids and cholesterol should begin at age 45 and be repeated every 5 years. If your lipid or cholesterol levels are high, you are over 50, or you are at high risk for heart disease, you may need your cholesterol levels checked more frequently.Ongoing high lipid and cholesterol levels should be treated with medicines if diet and exercise are not working.  If you smoke, find out from your health care provider how to quit. If you do not use tobacco, do not start.  Lung cancer screening is recommended for adults aged 45-80 years who are at high risk for developing lung cancer because of a history of smoking. A yearly low-dose CT scan of the lungs is recommended for people who have at least a 30-pack-year history of smoking and are a current smoker or have quit within the past 15 years. A pack year of smoking is smoking an average of 1 pack of cigarettes a day for 1 year (for example: 1 pack a day for 30 years or 2 packs a day for 15 years). Yearly screening should continue until the smoker has stopped smoking for at least 15 years. Yearly screening should be stopped for people who develop a health problem that would prevent them from having lung cancer treatment.  If you are pregnant, do not drink alcohol. If you are  breastfeeding, be very cautious about drinking alcohol. If you are not pregnant and choose to drink alcohol, do not have more than 1 drink per day. One drink is considered to be 12 ounces (355 mL) of beer, 5 ounces (148 mL) of wine, or 1.5 ounces (44 mL) of liquor.  Avoid use of street drugs. Do not share needles with anyone. Ask for help if you need support or instructions about stopping the use of drugs.  High blood pressure causes heart disease and increases the risk  of stroke. Your blood pressure should be checked at least every 1 to 2 years. Ongoing high blood pressure should be treated with medicines if weight loss and exercise do not work.  If you are 55-79 years old, ask your health care provider if you should take aspirin to prevent strokes.  Diabetes screening is done by taking a blood sample to check your blood glucose level after you have not eaten for a certain period of time (fasting). If you are not overweight and you do not have risk factors for diabetes, you should be screened once every 3 years starting at age 45. If you are overweight or obese and you are 40-70 years of age, you should be screened for diabetes every year as part of your cardiovascular risk assessment.  Breast cancer screening is essential preventive care for women. You should practice "breast self-awareness." This means understanding the normal appearance and feel of your breasts and may include breast self-examination. Any changes detected, no matter how small, should be reported to a health care provider. Women in their 20s and 30s should have a clinical breast exam (CBE) by a health care provider as part of a regular health exam every 1 to 3 years. After age 40, women should have a CBE every year. Starting at age 40, women should consider having a mammogram (breast X-ray test) every year. Women who have a family history of breast cancer should talk to their health care provider about genetic screening. Women at a high risk of breast cancer should talk to their health care providers about having an MRI and a mammogram every year.  Breast cancer gene (BRCA)-related cancer risk assessment is recommended for women who have family members with BRCA-related cancers. BRCA-related cancers include breast, ovarian, tubal, and peritoneal cancers. Having family members with these cancers may be associated with an increased risk for harmful changes (mutations) in the breast cancer genes BRCA1 and  BRCA2. Results of the assessment will determine the need for genetic counseling and BRCA1 and BRCA2 testing.  Your health care provider may recommend that you be screened regularly for cancer of the pelvic organs (ovaries, uterus, and vagina). This screening involves a pelvic examination, including checking for microscopic changes to the surface of your cervix (Pap test). You may be encouraged to have this screening done every 3 years, beginning at age 21.  For women ages 30-65, health care providers may recommend pelvic exams and Pap testing every 3 years, or they may recommend the Pap and pelvic exam, combined with testing for human papilloma virus (HPV), every 5 years. Some types of HPV increase your risk of cervical cancer. Testing for HPV may also be done on women of any age with unclear Pap test results.  Other health care providers may not recommend any screening for nonpregnant women who are considered low risk for pelvic cancer and who do not have symptoms. Ask your health care provider if a screening pelvic exam is right for   you.  If you have had past treatment for cervical cancer or a condition that could lead to cancer, you need Pap tests and screening for cancer for at least 20 years after your treatment. If Pap tests have been discontinued, your risk factors (such as having a new sexual partner) need to be reassessed to determine if screening should resume. Some women have medical problems that increase the chance of getting cervical cancer. In these cases, your health care provider may recommend more frequent screening and Pap tests.  Colorectal cancer can be detected and often prevented. Most routine colorectal cancer screening begins at the age of 50 years and continues through age 75 years. However, your health care provider may recommend screening at an earlier age if you have risk factors for colon cancer. On a yearly basis, your health care provider may provide home test kits to check  for hidden blood in the stool. Use of a small camera at the end of a tube, to directly examine the colon (sigmoidoscopy or colonoscopy), can detect the earliest forms of colorectal cancer. Talk to your health care provider about this at age 50, when routine screening begins. Direct exam of the colon should be repeated every 5-10 years through age 75 years, unless early forms of precancerous polyps or small growths are found.  People who are at an increased risk for hepatitis B should be screened for this virus. You are considered at high risk for hepatitis B if:  You were born in a country where hepatitis B occurs often. Talk with your health care provider about which countries are considered high risk.  Your parents were born in a high-risk country and you have not received a shot to protect against hepatitis B (hepatitis B vaccine).  You have HIV or AIDS.  You use needles to inject street drugs.  You live with, or have sex with, someone who has hepatitis B.  You get hemodialysis treatment.  You take certain medicines for conditions like cancer, organ transplantation, and autoimmune conditions.  Hepatitis C blood testing is recommended for all people born from 1945 through 1965 and any individual with known risks for hepatitis C.  Practice safe sex. Use condoms and avoid high-risk sexual practices to reduce the spread of sexually transmitted infections (STIs). STIs include gonorrhea, chlamydia, syphilis, trichomonas, herpes, HPV, and human immunodeficiency virus (HIV). Herpes, HIV, and HPV are viral illnesses that have no cure. They can result in disability, cancer, and death.  You should be screened for sexually transmitted illnesses (STIs) including gonorrhea and chlamydia if:  You are sexually active and are younger than 24 years.  You are older than 24 years and your health care provider tells you that you are at risk for this type of infection.  Your sexual activity has changed  since you were last screened and you are at an increased risk for chlamydia or gonorrhea. Ask your health care provider if you are at risk.  If you are at risk of being infected with HIV, it is recommended that you take a prescription medicine daily to prevent HIV infection. This is called preexposure prophylaxis (PrEP). You are considered at risk if:  You are sexually active and do not regularly use condoms or know the HIV status of your partner(s).  You take drugs by injection.  You are sexually active with a partner who has HIV.  Talk with your health care provider about whether you are at high risk of being infected with HIV. If   you choose to begin PrEP, you should first be tested for HIV. You should then be tested every 3 months for as long as you are taking PrEP.  Osteoporosis is a disease in which the bones lose minerals and strength with aging. This can result in serious bone fractures or breaks. The risk of osteoporosis can be identified using a bone density scan. Women ages 67 years and over and women at risk for fractures or osteoporosis should discuss screening with their health care providers. Ask your health care provider whether you should take a calcium supplement or vitamin D to reduce the rate of osteoporosis.  Menopause can be associated with physical symptoms and risks. Hormone replacement therapy is available to decrease symptoms and risks. You should talk to your health care provider about whether hormone replacement therapy is right for you.  Use sunscreen. Apply sunscreen liberally and repeatedly throughout the day. You should seek shade when your shadow is shorter than you. Protect yourself by wearing long sleeves, pants, a wide-brimmed hat, and sunglasses year round, whenever you are outdoors.  Once a month, do a whole body skin exam, using a mirror to look at the skin on your back. Tell your health care provider of new moles, moles that have irregular borders, moles that  are larger than a pencil eraser, or moles that have changed in shape or color.  Stay current with required vaccines (immunizations).  Influenza vaccine. All adults should be immunized every year.  Tetanus, diphtheria, and acellular pertussis (Td, Tdap) vaccine. Pregnant women should receive 1 dose of Tdap vaccine during each pregnancy. The dose should be obtained regardless of the length of time since the last dose. Immunization is preferred during the 27th-36th week of gestation. An adult who has not previously received Tdap or who does not know her vaccine status should receive 1 dose of Tdap. This initial dose should be followed by tetanus and diphtheria toxoids (Td) booster doses every 10 years. Adults with an unknown or incomplete history of completing a 3-dose immunization series with Td-containing vaccines should begin or complete a primary immunization series including a Tdap dose. Adults should receive a Td booster every 10 years.  Varicella vaccine. An adult without evidence of immunity to varicella should receive 2 doses or a second dose if she has previously received 1 dose. Pregnant females who do not have evidence of immunity should receive the first dose after pregnancy. This first dose should be obtained before leaving the health care facility. The second dose should be obtained 4-8 weeks after the first dose.  Human papillomavirus (HPV) vaccine. Females aged 13-26 years who have not received the vaccine previously should obtain the 3-dose series. The vaccine is not recommended for use in pregnant females. However, pregnancy testing is not needed before receiving a dose. If a female is found to be pregnant after receiving a dose, no treatment is needed. In that case, the remaining doses should be delayed until after the pregnancy. Immunization is recommended for any person with an immunocompromised condition through the age of 61 years if she did not get any or all doses earlier. During the  3-dose series, the second dose should be obtained 4-8 weeks after the first dose. The third dose should be obtained 24 weeks after the first dose and 16 weeks after the second dose.  Zoster vaccine. One dose is recommended for adults aged 30 years or older unless certain conditions are present.  Measles, mumps, and rubella (MMR) vaccine. Adults born  before 1957 generally are considered immune to measles and mumps. Adults born in 1957 or later should have 1 or more doses of MMR vaccine unless there is a contraindication to the vaccine or there is laboratory evidence of immunity to each of the three diseases. A routine second dose of MMR vaccine should be obtained at least 28 days after the first dose for students attending postsecondary schools, health care workers, or international travelers. People who received inactivated measles vaccine or an unknown type of measles vaccine during 1963-1967 should receive 2 doses of MMR vaccine. People who received inactivated mumps vaccine or an unknown type of mumps vaccine before 1979 and are at high risk for mumps infection should consider immunization with 2 doses of MMR vaccine. For females of childbearing age, rubella immunity should be determined. If there is no evidence of immunity, females who are not pregnant should be vaccinated. If there is no evidence of immunity, females who are pregnant should delay immunization until after pregnancy. Unvaccinated health care workers born before 1957 who lack laboratory evidence of measles, mumps, or rubella immunity or laboratory confirmation of disease should consider measles and mumps immunization with 2 doses of MMR vaccine or rubella immunization with 1 dose of MMR vaccine.  Pneumococcal 13-valent conjugate (PCV13) vaccine. When indicated, a person who is uncertain of his immunization history and has no record of immunization should receive the PCV13 vaccine. All adults 65 years of age and older should receive this  vaccine. An adult aged 19 years or older who has certain medical conditions and has not been previously immunized should receive 1 dose of PCV13 vaccine. This PCV13 should be followed with a dose of pneumococcal polysaccharide (PPSV23) vaccine. Adults who are at high risk for pneumococcal disease should obtain the PPSV23 vaccine at least 8 weeks after the dose of PCV13 vaccine. Adults older than 80 years of age who have normal immune system function should obtain the PPSV23 vaccine dose at least 1 year after the dose of PCV13 vaccine.  Pneumococcal polysaccharide (PPSV23) vaccine. When PCV13 is also indicated, PCV13 should be obtained first. All adults aged 65 years and older should be immunized. An adult younger than age 65 years who has certain medical conditions should be immunized. Any person who resides in a nursing home or long-term care facility should be immunized. An adult smoker should be immunized. People with an immunocompromised condition and certain other conditions should receive both PCV13 and PPSV23 vaccines. People with human immunodeficiency virus (HIV) infection should be immunized as soon as possible after diagnosis. Immunization during chemotherapy or radiation therapy should be avoided. Routine use of PPSV23 vaccine is not recommended for American Indians, [place] Natives, or people younger than 65 years unless there are medical conditions that require PPSV23 vaccine. When indicated, people who have unknown immunization and have no record of immunization should receive PPSV23 vaccine. One-time revaccination 5 years after the first dose of PPSV23 is recommended for people aged 19-64 years who have chronic kidney failure, nephrotic syndrome, asplenia, or immunocompromised conditions. People who received 1-2 doses of PPSV23 before age 65 years should receive another dose of PPSV23 vaccine at age 65 years or later if at least 5 years have passed since the previous dose. Doses of PPSV23 are not  needed for people immunized with PPSV23 at or after age 65 years.  Meningococcal vaccine. Adults with asplenia or persistent complement component deficiencies should receive 2 doses of quadrivalent meningococcal conjugate (MenACWY-D) vaccine. The doses should be obtained   at least 2 months apart. Microbiologists working with certain meningococcal bacteria, [place] recruits, people at risk during an outbreak, and people who travel to or live in countries with a high rate of meningitis should be immunized. A first-year college student up through age 34 years who is living in a residence hall should receive a dose if she did not receive a dose on or after her 16th birthday. Adults who have certain high-risk conditions should receive one or more doses of vaccine.  Hepatitis A vaccine. Adults who wish to be protected from this disease, have certain high-risk conditions, work with hepatitis A-infected animals, work in hepatitis A research labs, or travel to or work in countries with a high rate of hepatitis A should be immunized. Adults who were previously unvaccinated and who anticipate close contact with an international adoptee during the first 60 days after arrival in the [place] from a country with a high rate of hepatitis A should be immunized.  Hepatitis B vaccine. Adults who wish to be protected from this disease, have certain high-risk conditions, may be exposed to blood or other infectious body fluids, are household contacts or sex partners of hepatitis B positive people, are clients or workers in certain care facilities, or travel to or work in countries with a high rate of hepatitis B should be immunized.  Haemophilus influenzae type b (Hib) vaccine. A previously unvaccinated person with asplenia or sickle cell disease or having a scheduled splenectomy should receive 1 dose of Hib vaccine. Regardless of previous immunization, a recipient of a hematopoietic stem cell transplant should receive a  3-dose series 6-12 months after her successful transplant. Hib vaccine is not recommended for adults with HIV infection. Preventive Services / Frequency Ages 35 to 4 years  Blood pressure check.** / Every 3-5 years.  Lipid and cholesterol check.** / Every 5 years beginning at age 60.  Clinical breast exam.** / Every 3 years for women in their 71s and 10s.  BRCA-related cancer risk assessment.** / For women who have family members with a BRCA-related cancer (breast, ovarian, tubal, or peritoneal cancers).  Pap test.** / Every 2 years from ages 76 through 26. Every 3 years starting at age 61 through age 76 or 93 with a history of 3 consecutive normal Pap tests.  HPV screening.** / Every 3 years from ages 37 through ages 60 to 51 with a history of 3 consecutive normal Pap tests.  Hepatitis C blood test.** / For any individual with known risks for hepatitis C.  Skin self-exam. / Monthly.  Influenza vaccine. / Every year.  Tetanus, diphtheria, and acellular pertussis (Tdap, Td) vaccine.** / Consult your health care provider. Pregnant women should receive 1 dose of Tdap vaccine during each pregnancy. 1 dose of Td every 10 years.  Varicella vaccine.** / Consult your health care provider. Pregnant females who do not have evidence of immunity should receive the first dose after pregnancy.  HPV vaccine. / 3 doses over 6 months, if 93 and younger. The vaccine is not recommended for use in pregnant females. However, pregnancy testing is not needed before receiving a dose.  Measles, mumps, rubella (MMR) vaccine.** / You need at least 1 dose of MMR if you were born in 1957 or later. You may also need a 2nd dose. For females of childbearing age, rubella immunity should be determined. If there is no evidence of immunity, females who are not pregnant should be vaccinated. If there is no evidence of immunity, females who are  pregnant should delay immunization until after pregnancy.  Pneumococcal  13-valent conjugate (PCV13) vaccine.** / Consult your health care provider.  Pneumococcal polysaccharide (PPSV23) vaccine.** / 1 to 2 doses if you smoke cigarettes or if you have certain conditions.  Meningococcal vaccine.** / 1 dose if you are age 68 to 8 years and a Market researcher living in a residence hall, or have one of several medical conditions, you need to get vaccinated against meningococcal disease. You may also need additional booster doses.  Hepatitis A vaccine.** / Consult your health care provider.  Hepatitis B vaccine.** / Consult your health care provider.  Haemophilus influenzae type b (Hib) vaccine.** / Consult your health care provider. Ages 7 to 53 years  Blood pressure check.** / Every year.  Lipid and cholesterol check.** / Every 5 years beginning at age 25 years.  Lung cancer screening. / Every year if you are aged 11-80 years and have a 30-pack-year history of smoking and currently smoke or have quit within the past 15 years. Yearly screening is stopped once you have quit smoking for at least 15 years or develop a health problem that would prevent you from having lung cancer treatment.  Clinical breast exam.** / Every year after age 48 years.  BRCA-related cancer risk assessment.** / For women who have family members with a BRCA-related cancer (breast, ovarian, tubal, or peritoneal cancers).  Mammogram.** / Every year beginning at age 41 years and continuing for as long as you are in good health. Consult with your health care provider.  Pap test.** / Every 3 years starting at age 65 years through age 37 or 70 years with a history of 3 consecutive normal Pap tests.  HPV screening.** / Every 3 years from ages 72 years through ages 60 to 40 years with a history of 3 consecutive normal Pap tests.  Fecal occult blood test (FOBT) of stool. / Every year beginning at age 21 years and continuing until age 5 years. You may not need to do this test if you get  a colonoscopy every 10 years.  Flexible sigmoidoscopy or colonoscopy.** / Every 5 years for a flexible sigmoidoscopy or every 10 years for a colonoscopy beginning at age 35 years and continuing until age 48 years.  Hepatitis C blood test.** / For all people born from [birth year] through 1965 and any individual with known risks for hepatitis C.  Skin self-exam. / Monthly.  Influenza vaccine. / Every year.  Tetanus, diphtheria, and acellular pertussis (Tdap/Td) vaccine.** / Consult your health care provider. Pregnant women should receive 1 dose of Tdap vaccine during each pregnancy. 1 dose of Td every 10 years.  Varicella vaccine.** / Consult your health care provider. Pregnant females who do not have evidence of immunity should receive the first dose after pregnancy.  Zoster vaccine.** / 1 dose for adults aged 30 years or older.  Measles, mumps, rubella (MMR) vaccine.** / You need at least 1 dose of MMR if you were born in 1957 or later. You may also need a second dose. For females of childbearing age, rubella immunity should be determined. If there is no evidence of immunity, females who are not pregnant should be vaccinated. If there is no evidence of immunity, females who are pregnant should delay immunization until after pregnancy.  Pneumococcal 13-valent conjugate (PCV13) vaccine.** / Consult your health care provider.  Pneumococcal polysaccharide (PPSV23) vaccine.** / 1 to 2 doses if you smoke cigarettes or if you have certain conditions.  Meningococcal vaccine.** /  Consult your health care provider.  Hepatitis A vaccine.** / Consult your health care provider.  Hepatitis B vaccine.** / Consult your health care provider.  Haemophilus influenzae type b (Hib) vaccine.** / Consult your health care provider. Ages 64 years and over  Blood pressure check.** / Every year.  Lipid and cholesterol check.** / Every 5 years beginning at age 23 years.  Lung cancer screening. / Every year if you  are aged 16-80 years and have a 30-pack-year history of smoking and currently smoke or have quit within the past 15 years. Yearly screening is stopped once you have quit smoking for at least 15 years or develop a health problem that would prevent you from having lung cancer treatment.  Clinical breast exam.** / Every year after age 74 years.  BRCA-related cancer risk assessment.** / For women who have family members with a BRCA-related cancer (breast, ovarian, tubal, or peritoneal cancers).  Mammogram.** / Every year beginning at age 44 years and continuing for as long as you are in good health. Consult with your health care provider.  Pap test.** / Every 3 years starting at age 58 years through age 22 or 39 years with 3 consecutive normal Pap tests. Testing can be stopped between 65 and 70 years with 3 consecutive normal Pap tests and no abnormal Pap or HPV tests in the past 10 years.  HPV screening.** / Every 3 years from ages 64 years through ages 70 or 61 years with a history of 3 consecutive normal Pap tests. Testing can be stopped between 65 and 70 years with 3 consecutive normal Pap tests and no abnormal Pap or HPV tests in the past 10 years.  Fecal occult blood test (FOBT) of stool. / Every year beginning at age 40 years and continuing until age 27 years. You may not need to do this test if you get a colonoscopy every 10 years.  Flexible sigmoidoscopy or colonoscopy.** / Every 5 years for a flexible sigmoidoscopy or every 10 years for a colonoscopy beginning at age 7 years and continuing until age 32 years.  Hepatitis C blood test.** / For all people born from [birth year] through 1963 and any individual with known risks for hepatitis C.  Osteoporosis screening.** / A one-time screening for women ages 30 years and over and women at risk for fractures or osteoporosis.  Skin self-exam. / Monthly.  Influenza vaccine. / Every year.  Tetanus, diphtheria, and acellular pertussis (Tdap/Td)  vaccine.** / 1 dose of Td every 10 years.  Varicella vaccine.** / Consult your health care provider.  Zoster vaccine.** / 1 dose for adults aged 35 years or older.  Pneumococcal 13-valent conjugate (PCV13) vaccine.** / Consult your health care provider.  Pneumococcal polysaccharide (PPSV23) vaccine.** / 1 dose for all adults aged 46 years and older.  Meningococcal vaccine.** / Consult your health care provider.  Hepatitis A vaccine.** / Consult your health care provider.  Hepatitis B vaccine.** / Consult your health care provider.  Haemophilus influenzae type b (Hib) vaccine.** / Consult your health care provider. ** Family history and personal history of risk and conditions may change your health care provider's recommendations.   This information is not intended to replace advice given to you by your health care provider. Make sure you discuss any questions you have with your health care provider.   Document Released: 08/27/2001 Document Revised: 07/22/2014 Document Reviewed: 11/26/2010 Elsevier Interactive Patient Education [employer].

## 2015-11-29 NOTE — Progress Notes (Signed)
Pre visit review using our clinic review tool, if applicable. No additional management support is needed unless otherwise documented below in the visit note. 

## 2015-11-29 NOTE — Progress Notes (Signed)
Subjective:  Patient ID: Natalie Goodman, female    DOB: October 13, 1924  Age: 82 y.o. MRN: [id number]  CC: Annual Exam; Hypertension; Hyperlipidemia; and Anemia   HPI Natalie Goodman presents for a CPX.  She tells me her blood pressure has been well controlled on the combination of furosemide, valsartan, and metoprolol. She denies chest pain, palpitations, edema, shortness of breath, or syncope.  She is due for a cholesterol follow-up, she is taking Crestor and has no side effects such as myalgias or arthralgias.  Outpatient Prescriptions Prior to Visit  Medication Sig Dispense Refill  . aspirin 325 MG tablet Take 325 mg by mouth daily.    . furosemide (LASIX) 20 MG tablet TAKE ONE TABLET BY MOUTH ONCE DAILY 90 tablet 0  . rosuvastatin (CRESTOR) 10 MG tablet Take 1 tablet (10 mg total) by mouth daily. NEED OV. 30 tablet 0  . valsartan (DIOVAN) 160 MG tablet Take 1 tablet (160 mg total) by mouth daily. 30 tablet 9  . metoprolol (LOPRESSOR) 50 MG tablet TAKE ONE TABLET BY MOUTH TWICE DAILY 60 tablet 11   No facility-administered medications prior to visit.    ROS Review of Systems  Constitutional: Negative.  Negative for fever, chills, diaphoresis, appetite change and fatigue.  HENT: Negative.   Eyes: Negative.   Respiratory: Negative.  Negative for cough, choking, chest tightness, shortness of breath and stridor.   Cardiovascular: Negative.  Negative for chest pain, palpitations and leg swelling.  Gastrointestinal: Negative.  Negative for nausea, vomiting, abdominal pain, diarrhea and constipation.  Endocrine: Negative.   Genitourinary: Negative.   Musculoskeletal: Negative.  Negative for myalgias, back pain, joint swelling and arthralgias.  Skin: Negative.  Negative for color change and rash.  Allergic/Immunologic: Negative.   Neurological: Positive for numbness. Negative for dizziness, tremors, weakness and light-headedness.       She complains of numbness in her toes  Hematological:  Negative.  Negative for adenopathy. Does not bruise/bleed easily.  Psychiatric/Behavioral: Negative.     Objective:  BP 118/60 mmHg  Pulse 60  Temp(Src) 98 F (36.7 C) (Oral)  Resp 16  Ht 5\' 5"  (1.651 m)  Wt 238 lb (107.956 kg)  BMI 39.61 kg/m2  SpO2 97%  BP Readings from Last 3 Encounters:  11/29/15 118/60  10/31/14 132/68  03/24/14 130/70    Wt Readings from Last 3 Encounters:  11/29/15 238 lb (107.956 kg)  10/31/14 240 lb 3.2 oz (108.954 kg)  03/24/14 230 lb (104.327 kg)    Physical Exam  Constitutional: She is oriented to person, place, and time. No distress.  HENT:  Mouth/Throat: No oropharyngeal exudate.  Eyes: Conjunctivae are normal. Right eye exhibits no discharge. Left eye exhibits no discharge. No scleral icterus.  Neck: Normal range of motion. Neck supple. No JVD present. No tracheal deviation present. No thyromegaly present.  Cardiovascular: Normal rate, regular rhythm, normal heart sounds and intact distal pulses.  Exam reveals no gallop and no friction rub.   No murmur heard. Pulmonary/Chest: Effort normal and breath sounds normal. No stridor. No respiratory distress. She has no wheezes. She has no rales. She exhibits no tenderness.  Abdominal: Soft. Bowel sounds are normal. She exhibits no distension and no mass. There is no tenderness. There is no rebound and no guarding.  Musculoskeletal: Normal range of motion. She exhibits no edema or tenderness.  Lymphadenopathy:    She has no cervical adenopathy.  Neurological: She is oriented to person, place, and time.  Skin: Skin is warm  and dry. No rash noted. She is not diaphoretic. No erythema. No pallor.  Psychiatric: She has a normal mood and affect. Her behavior is normal. Judgment and thought content normal.  Vitals reviewed.   Lab Results  Component Value Date   WBC 10.8* 11/29/2015   HGB 11.3* 11/29/2015   HCT 34.5* 11/29/2015   PLT 231.0 11/29/2015   GLUCOSE 95 11/29/2015   CHOL 134 11/29/2015    TRIG 86.0 11/29/2015   HDL 43.40 11/29/2015   LDLDIRECT 144.3 12/04/2011   LDLCALC 74 11/29/2015   ALT 12 11/29/2015   AST 20 11/29/2015   NA 140 11/29/2015   K 4.8 11/29/2015   CL 111 11/29/2015   CREATININE 1.27* 11/29/2015   BUN 34* 11/29/2015   CO2 25 11/29/2015   TSH 4.94* 11/29/2015    Ct Chest Wo Contrast  11/08/2014  CLINICAL DATA:  Follow up of lung nodules from prior CT chest 10/29/2013 EXAM: CT CHEST WITHOUT CONTRAST TECHNIQUE: Multidetector CT imaging of the chest was performed following the standard protocol without IV contrast. COMPARISON:  10/29/2013 FINDINGS: Mediastinum/Nodes: Aortic, branch vessel, and considerable coronary artery atherosclerotic calcification. Mild cardiomegaly. No pathologic thoracic adenopathy identified. Asymmetric glandular tissues of the breast, left greater than right. Lungs/Pleura: The pleural based bandlike nodule in the right upper lobe measures 2 mm craniocaudad by 4 mm in diameter on image 48 series 602, stable from 10/29/2013, and considered benign. 3 mm subpleural nodule in the left lower lobe, image 33 series 3, stable from prior exam and considered benign. No new nodule.  Mild interstitial accentuation of both lung bases. Upper abdomen: Unremarkable Musculoskeletal: Thoracic spondylosis with bridging spurring and much of the thoracic spine. IMPRESSION: 1. The tiny pulmonary nodules shown on the prior exam are stable and considered benign. No further workup of these nodules is necessary. 2. Mild interstitial accentuation and scarring at the lung bases, chronic. 3. Thoracic spondylosis. Electronically Signed   By: Gaylyn Rong M.D.   On: 11/08/2014 16:01    Assessment & Plan:   Natalie Goodman was seen today for annual exam, hypertension, hyperlipidemia and anemia.  Diagnoses and all orders for this visit:  Atherosclerosis of native coronary artery without angina pectoris, unspecified whether native or transplanted heart- she said no recent  episodes of chest pain or shortness of breath, will continue risk factor modification with beta blocker therapy, aspirin therapy, statin therapy, and blood pressure control. -     metoprolol (LOPRESSOR) 50 MG tablet; TAKE ONE TABLET BY MOUTH TWICE DAILY -     Lipid panel; Future  Essential hypertension- pressure is well-controlled, electrolytes and renal function are stable. -     metoprolol (LOPRESSOR) 50 MG tablet; TAKE ONE TABLET BY MOUTH TWICE DAILY -     TSH; Future -     Urinalysis, Routine w reflex microscopic (not at [facility]); Future -     Comprehensive metabolic panel; Future  KIDNEY DISEASE, CHRONIC, STAGE I- her renal function is stable, she will continue avoid nephrotoxic agents, will maintain blood pressure control. -     Comprehensive metabolic panel; Future  Lung nodule  PARESTHESIA- -     Folate; Future -     Vitamin B12; Future  Routine general medical examination at a health care facility  Deficiency anemia- her hemoglobin and hematocrit are stable, her vitamin levels are all normal, this is most consistent with the anemia of chronic disease and renal insufficiency -     CBC with Differential/Platelet; Future -  IBC panel; Future -     Ferritin; Future -     Folate; Future -     Vitamin B12; Future  Need for 23-polyvalent pneumococcal polysaccharide vaccine -     Pneumococcal polysaccharide vaccine 23-valent greater than or equal to 2yo subcutaneous/IM   I am having Natalie Goodman maintain her aspirin, valsartan, rosuvastatin, furosemide, and metoprolol.  Meds ordered this encounter  Medications  . metoprolol (LOPRESSOR) 50 MG tablet    Sig: TAKE ONE TABLET BY MOUTH TWICE DAILY    Dispense:  180 tablet    Refill:  3    See AVS for instructions about healthy living and anticipatory guidance.  Follow-up: Return in about 6 months (around 05/31/2016).  Sanda Linger, MD

## 2015-12-03 NOTE — Assessment & Plan Note (Signed)

## 2016-01-18 ENCOUNTER — Other Ambulatory Visit: Payer: Self-pay | Admitting: *Deleted

## 2016-01-18 MED ORDER — ROSUVASTATIN CALCIUM 10 MG PO TABS: 10.0000 mg | ORAL_TABLET | Freq: Every day | ORAL | Status: DC

## 2016-01-19 NOTE — Progress Notes (Signed)
HPI: FU coronary disease. She was previously seen by Dr. Mayford Knifeurner. She apparently had a myocardial infarction in the past. She does not recall stents or catheterization. Echocardiogram in August of 2013 showed an ejection fraction of 50-55%. There was mild left atrial enlargement and mild mitral regurgitation. Nuclear study in August of 2013 showed an ejection fraction of 61%. There was a scar in the inferior and lateral wall and moderate ischemia as well. I reviewed the images and felt the ischemia was mild to moderate. She was not having symptoms and we have elected to treat this medically. Since I last saw her, She denies dyspnea, chest pain, palpitations or syncope.  Current Outpatient Prescriptions  Medication Sig Dispense Refill  . aspirin 325 MG tablet Take 325 mg by mouth daily.    . furosemide (LASIX) 20 MG tablet TAKE ONE TABLET BY MOUTH ONCE DAILY 90 tablet 0  . metoprolol (LOPRESSOR) 50 MG tablet TAKE ONE TABLET BY MOUTH TWICE DAILY 180 tablet 3  . rosuvastatin (CRESTOR) 10 MG tablet Take 1 tablet (10 mg total) by mouth daily. 30 tablet 0  . valsartan (DIOVAN) 160 MG tablet Take 1 tablet (160 mg total) by mouth daily. 30 tablet 9   No current facility-administered medications for this visit.     Past Medical History  Diagnosis Date  . Hypertension   . Hyperlipidemia   . CAD (coronary artery disease)   . MYOCARDIAL INFARCTION, HX OF   . GERD   . KIDNEY DISEASE, CHRONIC, STAGE I   . OSTEOARTHRITIS   . PARESTHESIA   . ESOPHAGEAL STRICTURE   . Anemia     Past Surgical History  Procedure Laterality Date  . Abdominal hysterectomy    . Revision total knee arthroplasty      Bilateral  . Tonsillectomy      Social History   Social History  . Marital Status: Widowed    Spouse Name: N/A  . Number of Children: N/A  . Years of Education: N/A   Occupational History  . Not on file.   Social History Main Topics  . Smoking status: Never Smoker   . Smokeless tobacco:  Never Used  . Alcohol Use: No  . Drug Use: No  . Sexual Activity: Not Currently   Other Topics Concern  . Not on file   Social History Narrative    Family History  Problem Relation Age of Onset  . Hypertension Mother   . Heart disease Mother     CHF  . Hypertension Father   . Alcohol abuse Neg Hx   . Cancer Neg Hx   . Stroke Neg Hx     ROS: no fevers or chills, productive cough, hemoptysis, dysphasia, odynophagia, melena, hematochezia, dysuria, hematuria, rash, seizure activity, orthopnea, PND, pedal edema, claudication. Remaining systems are negative.  Physical Exam: Well-developed obese in no acute distress.  Skin is warm and dry.  HEENT is normal.  Neck is supple.  Chest is clear to auscultation with normal expansion.  Cardiovascular exam is regular rate and rhythm.  Abdominal exam nontender or distended. No masses palpated. Extremities show trace edema. neuro grossly intact  ECG Normal sinus rhythm at a rate of 74. Cannot rule out prior anterior infarct.  A/P  1 Coronary artery disease-continue aspirin and statin. Note we have elected to treat previous abnormal nuclear study medically given her age. She remains asymptomatic and we will continue with this strategy. 2 hyperlipidemia-continue statin. 3 Hypertension-blood pressure controlled. Continue  present medications. Check potassium and renal function.  Olga MillersBrian Crenshaw, MD

## 2016-01-23 ENCOUNTER — Ambulatory Visit (INDEPENDENT_AMBULATORY_CARE_PROVIDER_SITE_OTHER): Payer: Commercial Managed Care - HMO | Admitting: Cardiology

## 2016-01-23 ENCOUNTER — Encounter: Payer: Self-pay | Admitting: Cardiology

## 2016-01-23 VITALS — BP 124/60 | HR 74 | Ht 65.0 in | Wt 234.0 lb

## 2016-01-23 DIAGNOSIS — I2510 Atherosclerotic heart disease of native coronary artery without angina pectoris: Secondary | ICD-10-CM | POA: Diagnosis not present

## 2016-01-23 DIAGNOSIS — I10 Essential (primary) hypertension: Secondary | ICD-10-CM | POA: Diagnosis not present

## 2016-01-23 DIAGNOSIS — E785 Hyperlipidemia, unspecified: Secondary | ICD-10-CM

## 2016-01-23 LAB — BASIC METABOLIC PANEL WITH GFR
BUN: 20 mg/dL (ref 7–25)
CALCIUM: 9.4 mg/dL (ref 8.6–10.4)
CHLORIDE: 107 mmol/L (ref 98–110)
CO2: 22 mmol/L (ref 20–31)
CREATININE: 1.19 mg/dL — AB (ref 0.60–0.88)
GFR, EST AFRICAN AMERICAN: 47 mL/min — AB (ref >=60)
GFR, Est Non African American: 41 mL/min — ABNORMAL LOW (ref >=60)
Glucose, Bld: 107 mg/dL — ABNORMAL HIGH (ref 65–99)
Potassium: 4.3 mmol/L (ref 3.5–5.3)
SODIUM: 141 mmol/L (ref 135–146)

## 2016-01-23 NOTE — Patient Instructions (Signed)
Medications  NO CHANGE    Lab work  BMET today if possible.   Follow-up in 6 months with Dr. Jens Somrenshaw.

## 2016-01-24 ENCOUNTER — Encounter: Payer: Self-pay | Admitting: *Deleted

## 2016-02-22 ENCOUNTER — Other Ambulatory Visit: Payer: Self-pay | Admitting: *Deleted

## 2016-02-22 MED ORDER — ROSUVASTATIN CALCIUM 10 MG PO TABS: 10.0000 mg | ORAL_TABLET | Freq: Every day | ORAL | 3 refills | Status: DC

## 2016-02-23 ENCOUNTER — Other Ambulatory Visit: Payer: Self-pay

## 2016-02-23 MED ORDER — VALSARTAN 160 MG PO TABS: 160.0000 mg | ORAL_TABLET | Freq: Every day | ORAL | 5 refills | Status: DC

## 2016-02-23 NOTE — Telephone Encounter (Signed)
REFILL 

## 2016-06-05 ENCOUNTER — Ambulatory Visit: Payer: Commercial Managed Care - HMO | Admitting: Internal Medicine

## 2016-06-05 DIAGNOSIS — Z0289 Encounter for other administrative examinations: Secondary | ICD-10-CM

## 2016-08-01 ENCOUNTER — Other Ambulatory Visit: Payer: Self-pay | Admitting: Cardiology

## 2016-08-02 NOTE — Telephone Encounter (Signed)
Rx has been sent to the pharmacy electronically. ° °

## 2016-10-21 ENCOUNTER — Encounter: Payer: Self-pay | Admitting: Internal Medicine

## 2016-10-21 ENCOUNTER — Ambulatory Visit (INDEPENDENT_AMBULATORY_CARE_PROVIDER_SITE_OTHER): Payer: Medicare HMO | Admitting: Internal Medicine

## 2016-10-21 VITALS — BP 110/70 | HR 70 | Temp 98.1°F | Resp 16 | Ht 65.0 in | Wt 226.0 lb

## 2016-10-21 DIAGNOSIS — I10 Essential (primary) hypertension: Secondary | ICD-10-CM | POA: Diagnosis not present

## 2016-10-21 DIAGNOSIS — M170 Bilateral primary osteoarthritis of knee: Secondary | ICD-10-CM

## 2016-10-21 MED ORDER — MELOXICAM 7.5 MG PO TABS: 7.5000 mg | ORAL_TABLET | Freq: Every day | ORAL | 1 refills | Status: DC

## 2016-10-21 NOTE — Progress Notes (Signed)
Pre visit review using our clinic review tool, if applicable. No additional management support is needed unless otherwise documented below in the visit note. 

## 2016-10-21 NOTE — Progress Notes (Signed)
Subjective:  Patient ID: Natalie Goodman, female    DOB: February 01, 1925  Age: 83 y.o. MRN: [id number]  CC: Osteoarthritis and Hypertension   HPI Natalie Goodman presents for The complaint of chronic, bilateral knee pain, worse on the right than the left. She has had joint replacement therapy in both knees. She's had no recent episodes of swelling around the knees. She is not taking anything for pain. She tells me the pain interferes with her ability to do activities of daily living and transfer in and out of her bed.  She has stopped taking Lasix and tells me her blood pressures been well controlled. She's had no recent episodes of chest pain, edema, shortness of breath, or fatigue.  Outpatient Medications Prior to Visit  Medication Sig Dispense Refill  . aspirin 325 MG tablet Take 325 mg by mouth daily.    . metoprolol (LOPRESSOR) 50 MG tablet TAKE ONE TABLET BY MOUTH TWICE DAILY 180 tablet 3  . rosuvastatin (CRESTOR) 10 MG tablet Take 1 tablet (10 mg total) by mouth daily. 90 tablet 3  . valsartan (DIOVAN) 160 MG tablet Take 1 tablet (160 mg total) by mouth daily. 30 tablet 6  . furosemide (LASIX) 20 MG tablet TAKE ONE TABLET BY MOUTH ONCE DAILY (Patient not taking: Reported on 10/21/2016) 90 tablet 0   No facility-administered medications prior to visit.     ROS Review of Systems  Constitutional: Negative.  Negative for chills, fatigue and fever.  HENT: Negative.   Eyes: Negative.   Respiratory: Negative for cough, chest tightness and shortness of breath.   Cardiovascular: Negative for chest pain, palpitations and leg swelling.  Gastrointestinal: Negative for abdominal pain, constipation, diarrhea and vomiting.  Endocrine: Negative.   Genitourinary: Negative.  Negative for difficulty urinating.  Musculoskeletal: Positive for arthralgias. Negative for myalgias and neck pain.  Skin: Negative.   Allergic/Immunologic: Negative.   Neurological: Negative.  Negative for dizziness.  Hematological:  Negative for adenopathy. Does not bruise/bleed easily.  Psychiatric/Behavioral: Negative.     Objective:  BP 110/70 (BP Location: Left Arm, Patient Position: Sitting, Cuff Size: Large)   Pulse 70   Temp 98.1 F (36.7 C) (Oral)   Resp 16   Ht  (1.651 m)   Wt 226 lb (102.5 kg)   SpO2 99%   BMI 37.61 kg/m   BP Readings from Last 3 Encounters:  10/21/16 110/70  01/23/16 124/60  11/29/15 118/60    Wt Readings from Last 3 Encounters:  10/21/16 226 lb (102.5 kg)  01/23/16 234 lb (106.1 kg)  11/29/15 238 lb (108 kg)    Physical Exam  Constitutional: She is oriented to person, place, and time. No distress.  HENT:  Mouth/Throat: Oropharynx is clear and moist. No oropharyngeal exudate.  Eyes: Conjunctivae are normal. Right eye exhibits no discharge. Left eye exhibits no discharge. No scleral icterus.  Neck: Normal range of motion. Neck supple. No JVD present. No tracheal deviation present. No thyromegaly present.  Cardiovascular: Normal rate, regular rhythm and intact distal pulses.  Exam reveals no gallop and no friction rub.   No murmur heard. Pulmonary/Chest: Effort normal and breath sounds normal. No stridor. No respiratory distress. She has no wheezes. She has no rales. She exhibits no tenderness.  Abdominal: Soft. Bowel sounds are normal. She exhibits no distension and no mass. There is no tenderness. There is no rebound and no guarding.  Musculoskeletal: She exhibits no edema or tenderness.       Right knee:  She exhibits deformity (DJD). She exhibits normal range of motion, no swelling, no effusion, no ecchymosis, no erythema, normal alignment and no bony tenderness. No tenderness found.       Left knee: She exhibits deformity (DJD). She exhibits normal range of motion, no swelling, no effusion, normal alignment and no bony tenderness. No tenderness found.  Lymphadenopathy:    She has no cervical adenopathy.  Neurological: She is alert and oriented to person, place, and  time.  Skin: She is not diaphoretic.  Vitals reviewed.   Lab Results  Component Value Date   WBC 10.8 (H) 11/29/2015   HGB 11.3 (L) 11/29/2015   HCT 34.5 (L) 11/29/2015   PLT 231.0 11/29/2015   GLUCOSE 107 (H) 01/23/2016   CHOL 134 11/29/2015   TRIG 86.0 11/29/2015   HDL 43.40 11/29/2015   LDLDIRECT 144.3 12/04/2011   LDLCALC 74 11/29/2015   ALT 12 11/29/2015   AST 20 11/29/2015   NA 141 01/23/2016   K 4.3 01/23/2016   CL 107 01/23/2016   CREATININE 1.19 (H) 01/23/2016   BUN 20 01/23/2016   CO2 22 01/23/2016   TSH 4.94 (H) 11/29/2015    Ct Chest Wo Contrast  Result Date: 11/08/2014 CLINICAL DATA:  Follow up of lung nodules from prior CT chest 10/29/2013 EXAM: CT CHEST WITHOUT CONTRAST TECHNIQUE: Multidetector CT imaging of the chest was performed following the standard protocol without IV contrast. COMPARISON:  10/29/2013 FINDINGS: Mediastinum/Nodes: Aortic, branch vessel, and considerable coronary artery atherosclerotic calcification. Mild cardiomegaly. No pathologic thoracic adenopathy identified. Asymmetric glandular tissues of the breast, left greater than right. Lungs/Pleura: The pleural based bandlike nodule in the right upper lobe measures 2 mm craniocaudad by 4 mm in diameter on image 48 series 602, stable from 10/29/2013, and considered benign. 3 mm subpleural nodule in the left lower lobe, image 33 series 3, stable from prior exam and considered benign. No new nodule.  Mild interstitial accentuation of both lung bases. Upper abdomen: Unremarkable Musculoskeletal: Thoracic spondylosis with bridging spurring and much of the thoracic spine. IMPRESSION: 1. The tiny pulmonary nodules shown on the prior exam are stable and considered benign. No further workup of these nodules is necessary. 2. Mild interstitial accentuation and scarring at the lung bases, chronic. 3. Thoracic spondylosis. Electronically Signed   By: Natalie Goodman M.D.   On: 11/08/2014 16:01    Assessment &  Plan:   Natalie Goodman was seen today for osteoarthritis and hypertension.  Diagnoses and all orders for this visit:  Primary osteoarthritis of both knees -     meloxicam (MOBIC) 7.5 MG tablet; Take 1 tablet (7.5 mg total) by mouth daily.  Essential hypertension- her blood pressure is adequately well controlled, will continue the beta blocker and ARB.   I have discontinued Ms. Vandenheuvel's furosemide. I am also having her start on meloxicam. Additionally, I am having her maintain her aspirin, metoprolol, rosuvastatin, and valsartan.  Meds ordered this encounter  Medications  . meloxicam (MOBIC) 7.5 MG tablet    Sig: Take 1 tablet (7.5 mg total) by mouth daily.    Dispense:  90 tablet    Refill:  1     Follow-up: Return if symptoms worsen or fail to improve.  Sanda Linger, MD

## 2016-10-21 NOTE — Patient Instructions (Signed)
Arthritis Arthritis means joint pain. It can also mean joint disease. A joint is a place where bones come together. People who have arthritis may have:  Red joints.  Swollen joints.  Stiff joints.  Warm joints.  A fever.  A feeling of being sick. Follow these instructions at home: Pay attention to any changes in your symptoms. Take these actions to help with your pain and swelling. Medicines   Take over-the-counter and prescription medicines only as told by your doctor.  Do not take aspirin for pain if your doctor says that you may have gout. Activity   Rest your joint if your doctor tells you to.  Avoid activities that make the pain worse.  Exercise your joint regularly as told by your doctor. Try doing exercises like:  Swimming.  Water aerobics.  Biking.  Walking. Joint Care    If your joint is swollen, keep it raised (elevated) if told by your doctor.  If your joint feels stiff in the morning, try taking a warm shower.  If you have diabetes, do not apply heat without asking your doctor.  If told, apply heat to the joint:  Put a towel between the joint and the hot pack or heating pad.  Leave the heat on the area for 20-30 minutes.  If told, apply ice to the joint:  Put ice in a plastic bag.  Place a towel between your skin and the bag.  Leave the ice on for 20 minutes, 2-3 times per day.  Keep all follow-up visits as told by your doctor. Contact a doctor if:  The pain gets worse.  You have a fever. Get help right away if:  You have very bad pain in your joint.  You have swelling in your joint.  Your joint is red.  Many joints become painful and swollen.  You have very bad back pain.  Your leg is very weak.  You cannot control your pee (urine) or poop (stool). This information is not intended to replace advice given to you by your health care provider. Make sure you discuss any questions you have with your health care  provider. Document Released: 09/25/2009 Document Revised: 12/07/2015 Document Reviewed: 09/26/2014 Elsevier Interactive Patient Education  2017 Elsevier Inc.  

## 2016-10-28 ENCOUNTER — Ambulatory Visit: Payer: Medicare HMO | Admitting: Physician Assistant

## 2016-11-12 ENCOUNTER — Telehealth: Payer: Self-pay

## 2016-11-12 DIAGNOSIS — I10 Essential (primary) hypertension: Secondary | ICD-10-CM

## 2016-11-12 DIAGNOSIS — I2510 Atherosclerotic heart disease of native coronary artery without angina pectoris: Secondary | ICD-10-CM

## 2016-11-12 MED ORDER — METOPROLOL TARTRATE 50 MG PO TABS: ORAL_TABLET | ORAL | 3 refills | Status: DC

## 2016-11-12 NOTE — Progress Notes (Signed)
Cardiology Office Note    Date:  11/13/2016   ID:  Natalie Goodman, Natalie Goodman May 09, 1925, MRN [id number]  PCP:  Sanda Linger, MD  Cardiologist: Dr. Jens Som   Chief Complaint  Patient presents with  . Follow-up    9 months    History of Present Illness:    Natalie Goodman is a 83 y.o. female with past medical history of prior MI (per patient report, unable to recall any cardiac catheterizations being performed or stents placed, NST in 2013 showing inferior and lateral wall scar with mild ischemia --> treated medically), HTN, HLD, and GERD who presents to the office today for 23-month follow-up.   She was last examined in the office by Dr. Jens Som in 01/2016 and denied any recent chest pain, dyspnea on exertion, palpitations, or syncope at that time. Continued medical treatment of her presumed CAD was recommended in the setting of her advanced age, therefore she was continued on ASA, BB and statin therapy.  In talking with the patient today, she denies any recent episodes of chest discomfort, palpitations, lightheadedness, dizziness, presyncope, or dyspnea on exertion. She does have chronic lower extremity edema which she reports has been present for years, denies any acute worsening of this.  She continues to live by herself and performs ADLs independently. No longer drives. She performs chair exercises daily and says this helps with her arthritis. She is very pleased that she has not had PNA or Influenza thus far this season.     Past Medical History:  Diagnosis Date  . Anemia   . CAD (coronary artery disease)    a. per patient report, unable to recall any cardiac catheterizations being performed or stents placed  b. NST in 2013 showing inferior and lateral wall scar with mild ischemia --> treated medically  . ESOPHAGEAL STRICTURE   . GERD   . Hyperlipidemia   . Hypertension   . KIDNEY DISEASE, CHRONIC, STAGE I   . MYOCARDIAL INFARCTION, HX OF   . OSTEOARTHRITIS   . PARESTHESIA     Past  Surgical History:  Procedure Laterality Date  . ABDOMINAL HYSTERECTOMY    . REVISION TOTAL KNEE ARTHROPLASTY     Bilateral  . TONSILLECTOMY      Current Medications: Outpatient Medications Prior to Visit  Medication Sig Dispense Refill  . aspirin 325 MG tablet Take 325 mg by mouth daily.    . meloxicam (MOBIC) 7.5 MG tablet Take 1 tablet (7.5 mg total) by mouth daily. 90 tablet 1  . metoprolol (LOPRESSOR) 50 MG tablet TAKE ONE TABLET BY MOUTH TWICE DAILY 180 tablet 3  . rosuvastatin (CRESTOR) 10 MG tablet Take 1 tablet (10 mg total) by mouth daily. 90 tablet 3  . valsartan (DIOVAN) 160 MG tablet Take 1 tablet (160 mg total) by mouth daily. 30 tablet 6   No facility-administered medications prior to visit.      Allergies:   Codeine   Social History   Social History  . Marital status: Widowed    Spouse name: N/A  . Number of children: N/A  . Years of education: N/A   Social History Main Topics  . Smoking status: Never Smoker  . Smokeless tobacco: Never Used  . Alcohol use No  . Drug use: No  . Sexual activity: Not Currently   Other Topics Concern  . None   Social History Narrative  . None     Family History:  The patient's family history includes Heart disease in her  mother; Hypertension in her father and mother.   Review of Systems:   Please see the history of present illness.     General:  No chills, fever, night sweats or weight changes. Positive for fatigue.  Cardiovascular:  No chest pain, dyspnea on exertion, edema, orthopnea, palpitations, paroxysmal nocturnal dyspnea. Dermatological: No rash, lesions/masses Respiratory: No cough, dyspnea Urologic: No hematuria, dysuria Abdominal:   No nausea, vomiting, diarrhea, bright red blood per rectum, melena, or hematemesis Neurologic:  No visual changes, wkns, changes in mental status. All other systems reviewed and are otherwise negative except as noted above.   Physical Exam:    VS:  BP 124/82   Pulse 64    Ht  (1.651 m)   Wt 236 lb (107 kg)   BMI 39.27 kg/m    General: Well developed, elderly African American,female appearing in no acute distress. Head: Normocephalic, atraumatic, sclera non-icteric, no xanthomas, nares are without discharge.  Neck: No carotid bruits. JVD not elevated.  Lungs: Respirations regular and unlabored, without wheezes or rales.  Heart: Regular rate and rhythm. No S3 or S4.  No murmur, no rubs, or gallops appreciated. Abdomen: Soft, non-tender, non-distended with normoactive bowel sounds. No hepatomegaly. No rebound/guarding. No obvious abdominal masses. Msk:  Strength and tone appear normal for age. No joint deformities or effusions. Extremities: No clubbing or cyanosis. Chronic lower extremity edema.  Distal pedal pulses are 2+ bilaterally. Neuro: Alert and oriented X 3. Moves all extremities spontaneously. No focal deficits noted. Psych:  Responds to questions appropriately with a normal affect. Skin: No rashes or lesions noted  Wt Readings from Last 3 Encounters:  11/13/16 236 lb (107 kg)  10/21/16 226 lb (102.5 kg)  01/23/16 234 lb (106.1 kg)     Studies/Labs Reviewed:   EKG:  EKG is ordered today.  The ekg ordered today demonstrates NSR, HR 64, with no acute ST or T-wave changes.   Recent Labs: 11/29/2015: ALT 12; Hemoglobin 11.3; Platelets 231.0; TSH 4.94 01/23/2016: BUN 20; Creat 1.19; Potassium 4.3; Sodium 141   Lipid Panel    Component Value Date/Time   CHOL 134 11/29/2015 1108   TRIG 86.0 11/29/2015 1108   HDL 43.40 11/29/2015 1108   CHOLHDL 3 11/29/2015 1108   VLDL 17.2 11/29/2015 1108   LDLCALC 74 11/29/2015 1108   LDLDIRECT 144.3 12/04/2011 1422    Additional studies/ records that were reviewed today include:   Echocardiogram: 02/2012 Study Conclusions  - Left ventricle: The cavity size was normal. There was mild focal basal hypertrophy of the septum. Systolic function was normal. The estimated ejection fraction was in  the range of 50% to 55%. There is hypokinesis. Doppler parameters are consistent with abnormal left ventricular relaxation (grade 1 diastolic dysfunction). Doppler parameters are consistent with high ventricular filling pressure. - Mitral valve: Mild regurgitation. - Left atrium: The atrium was mildly dilated. - Pulmonary arteries: Systolic pressure was mildly increased. PA peak pressure: 38mm Hg (S).   Assessment:    1. Coronary artery disease involving native coronary artery of native heart without angina pectoris   2. Essential hypertension   3. Hyperlipidemia LDL goal <70      Plan:   In order of problems listed above:  1. CAD - patient reports known CAD but is unable to recall any cardiac catheterizations being performed or stents placed. NST was performed in 2013 and showed inferior and lateral wall scar with mild ischemia --> medical management recommended at that time in the setting of  her advanced age.  - she denies any recent chest discomfort, palpitations, lightheadedness, dizziness, presyncope, or dyspnea on exertion. EKG shows NSR with no acute changes. Would not pursue further ischemic testing at this time with no recent anginal symptoms and in the setting of her advanced age. She is in agreement with this.  - continue medical management with ASA, statin, and BB therapy.   2. HTN - BP well-controlled at 124/82 during today's visit. - continue Lopressor  BID and Valsartan  daily.   3. HLD - followed by PCP. - Lipid Panel in 11/2015 showed total cholesterol 134, HDL 43, and LDL 74. - continue Crestor  daily.    Medication Adjustments/Labs and Tests Ordered: Current medicines are reviewed at length with the patient today.  Concerns regarding medicines are outlined above.  Medication changes, Labs and Tests ordered today are listed in the Patient Instructions below. Patient Instructions  Medication Instructions:  Your physician recommends  that you continue on your current medications as directed. Please refer to the Current Medication list given to you today.  If you need a refill on your cardiac medications before your next appointment, please call your pharmacy.  Follow-Up: Your physician wants you to follow-up in: 12 MONTHS WITH DR Jens Som You will receive a reminder letter in the mail two months in advance. If you don't receive a letter, please call our office MARCH 2019 to schedule the MAY 2019 follow-up appointment.    Signed, Ellsworth Lennox, PA-C  11/13/2016 3:49 PM    [facility] [address] [place], [address] [place], [place]  [address] Phone: [phone]; Fax: [phone]  [address] [place], [place] [address] Phone: [phone]

## 2016-11-12 NOTE — Telephone Encounter (Signed)
erx for metoprolol sent to [facility]

## 2016-11-13 ENCOUNTER — Ambulatory Visit (INDEPENDENT_AMBULATORY_CARE_PROVIDER_SITE_OTHER): Payer: Medicare HMO | Admitting: Student

## 2016-11-13 ENCOUNTER — Encounter: Payer: Self-pay | Admitting: Student

## 2016-11-13 VITALS — BP 124/82 | HR 64 | Ht 65.0 in | Wt 236.0 lb

## 2016-11-13 DIAGNOSIS — I2510 Atherosclerotic heart disease of native coronary artery without angina pectoris: Secondary | ICD-10-CM | POA: Diagnosis not present

## 2016-11-13 DIAGNOSIS — I10 Essential (primary) hypertension: Secondary | ICD-10-CM | POA: Diagnosis not present

## 2016-11-13 DIAGNOSIS — E785 Hyperlipidemia, unspecified: Secondary | ICD-10-CM

## 2016-11-13 MED ORDER — VALSARTAN 160 MG PO TABS: 160.0000 mg | ORAL_TABLET | Freq: Every day | ORAL | 3 refills | Status: DC

## 2016-11-13 NOTE — Patient Instructions (Signed)
Medication Instructions:  Your physician recommends that you continue on your current medications as directed. Please refer to the Current Medication list given to you today.  If you need a refill on your cardiac medications before your next appointment, please call your pharmacy.  Follow-Up: Your physician wants you to follow-up in: 12 MONTHS WITH DR Jens Som You will receive a reminder letter in the mail two months in advance. If you don't receive a letter, please call our office MARCH 2019 to schedule the MAY 2019 follow-up appointment.  Thank you for choosing [facility] at [facility]!!

## 2017-01-30 ENCOUNTER — Other Ambulatory Visit: Payer: Self-pay | Admitting: Internal Medicine

## 2017-01-30 ENCOUNTER — Telehealth: Payer: Self-pay | Admitting: Internal Medicine

## 2017-01-30 DIAGNOSIS — M170 Bilateral primary osteoarthritis of knee: Secondary | ICD-10-CM

## 2017-01-30 MED ORDER — MELOXICAM 7.5 MG PO TABS: 7.5000 mg | ORAL_TABLET | Freq: Every day | ORAL | 1 refills | Status: DC

## 2017-01-30 NOTE — Telephone Encounter (Signed)
Pt would like a refill of meloxicam (MOBIC) 7.5 MG tablet   [facility]

## 2017-05-24 ENCOUNTER — Other Ambulatory Visit: Payer: Self-pay | Admitting: Cardiology

## 2017-05-26 NOTE — Telephone Encounter (Signed)
REFILL 

## 2017-05-27 DIAGNOSIS — H9191 Unspecified hearing loss, right ear: Secondary | ICD-10-CM | POA: Diagnosis not present

## 2017-06-10 DIAGNOSIS — T161XXA Foreign body in right ear, initial encounter: Secondary | ICD-10-CM | POA: Diagnosis not present

## 2017-08-19 ENCOUNTER — Encounter (HOSPITAL_COMMUNITY): Payer: Self-pay | Admitting: *Deleted

## 2017-08-19 ENCOUNTER — Ambulatory Visit (HOSPITAL_COMMUNITY)
Admission: EM | Admit: 2017-08-19 | Discharge: 2017-08-19 | Disposition: A | Discharge status: Home / Self Care | Payer: Medicare HMO | Source: Home / Self Care | Attending: Emergency Medicine | Admitting: Emergency Medicine

## 2017-08-19 ENCOUNTER — Emergency Department (HOSPITAL_COMMUNITY)
Admission: EM | Admit: 2017-08-19 | Discharge: 2017-08-19 | Disposition: A | Discharge status: Home / Self Care | Payer: Medicare HMO | Attending: Emergency Medicine | Admitting: Emergency Medicine

## 2017-08-19 ENCOUNTER — Encounter (HOSPITAL_COMMUNITY): Payer: Self-pay | Admitting: Emergency Medicine

## 2017-08-19 ENCOUNTER — Emergency Department (HOSPITAL_COMMUNITY): Payer: Medicare HMO

## 2017-08-19 ENCOUNTER — Other Ambulatory Visit: Payer: Self-pay

## 2017-08-19 DIAGNOSIS — R2242 Localized swelling, mass and lump, left lower limb: Secondary | ICD-10-CM | POA: Diagnosis not present

## 2017-08-19 DIAGNOSIS — R2241 Localized swelling, mass and lump, right lower limb: Secondary | ICD-10-CM | POA: Diagnosis not present

## 2017-08-19 DIAGNOSIS — Z96653 Presence of artificial knee joint, bilateral: Secondary | ICD-10-CM | POA: Diagnosis not present

## 2017-08-19 DIAGNOSIS — M7989 Other specified soft tissue disorders: Secondary | ICD-10-CM | POA: Diagnosis not present

## 2017-08-19 DIAGNOSIS — I129 Hypertensive chronic kidney disease with stage 1 through stage 4 chronic kidney disease, or unspecified chronic kidney disease: Secondary | ICD-10-CM | POA: Insufficient documentation

## 2017-08-19 DIAGNOSIS — Z7982 Long term (current) use of aspirin: Secondary | ICD-10-CM | POA: Insufficient documentation

## 2017-08-19 DIAGNOSIS — R222 Localized swelling, mass and lump, trunk: Secondary | ICD-10-CM | POA: Diagnosis not present

## 2017-08-19 DIAGNOSIS — N181 Chronic kidney disease, stage 1: Secondary | ICD-10-CM | POA: Diagnosis not present

## 2017-08-19 DIAGNOSIS — R600 Localized edema: Secondary | ICD-10-CM

## 2017-08-19 DIAGNOSIS — I2510 Atherosclerotic heart disease of native coronary artery without angina pectoris: Secondary | ICD-10-CM | POA: Diagnosis not present

## 2017-08-19 DIAGNOSIS — Z79899 Other long term (current) drug therapy: Secondary | ICD-10-CM | POA: Diagnosis not present

## 2017-08-19 LAB — COMPREHENSIVE METABOLIC PANEL
ALK PHOS: 67 U/L (ref 38–126)
ALT: 24 U/L (ref 14–54)
AST: 40 U/L (ref 15–41)
Albumin: 3 g/dL — ABNORMAL LOW (ref 3.5–5.0)
Anion gap: 11 (ref 5–15)
BUN: 9 mg/dL (ref 6–20)
CALCIUM: 9.9 mg/dL (ref 8.9–10.3)
CO2: 24 mmol/L (ref 22–32)
CREATININE: 0.88 mg/dL (ref 0.44–1.00)
Chloride: 109 mmol/L (ref 101–111)
GFR, EST NON AFRICAN AMERICAN: 56 mL/min — AB (ref >60)
Glucose, Bld: 81 mg/dL (ref 65–99)
Potassium: 3.1 mmol/L — ABNORMAL LOW (ref 3.5–5.1)
Sodium: 144 mmol/L (ref 135–145)
Total Bilirubin: 0.9 mg/dL (ref 0.3–1.2)
Total Protein: 7 g/dL (ref 6.5–8.1)

## 2017-08-19 LAB — CBC WITH DIFFERENTIAL/PLATELET
BASOS PCT: 0 %
Basophils Absolute: 0 10*3/uL (ref 0.0–0.1)
EOS ABS: 0.2 10*3/uL (ref 0.0–0.7)
Eosinophils Relative: 3 %
HCT: 35.5 % — ABNORMAL LOW (ref 36.0–46.0)
HEMOGLOBIN: 11.2 g/dL — AB (ref 12.0–15.0)
Lymphocytes Relative: 26 %
Lymphs Abs: 1.6 10*3/uL (ref 0.7–4.0)
MCH: 29.5 pg (ref 26.0–34.0)
MCHC: 31.5 g/dL (ref 30.0–36.0)
MCV: 93.4 fL (ref 78.0–100.0)
Monocytes Absolute: 0.7 10*3/uL (ref 0.1–1.0)
Monocytes Relative: 11 %
NEUTROS PCT: 60 %
Neutro Abs: 3.7 10*3/uL (ref 1.7–7.7)
Platelets: 136 10*3/uL — ABNORMAL LOW (ref 150–400)
RBC: 3.8 MIL/uL — AB (ref 3.87–5.11)
RDW: 16.2 % — ABNORMAL HIGH (ref 11.5–15.5)
WBC: 6.2 10*3/uL (ref 4.0–10.5)

## 2017-08-19 NOTE — Discharge Instructions (Signed)
Workup for the bilateral lower extremity edema without any follow-up with your primary care doctor in the next few days for additional evaluation and workup.  Safe to go home.  Return for any new or worse symptoms.

## 2017-08-19 NOTE — ED Notes (Signed)
Patient transported to X-ray 

## 2017-08-19 NOTE — ED Triage Notes (Signed)
Pt c/o bilateral leg swelling x2 weeks. Pt lower legs are very swollen. Pt has difficulty walking.

## 2017-08-19 NOTE — ED Triage Notes (Signed)
Pt was sent from urgent care for increased bilateral leg swelling to the point she cannot even lift legs to put them on the wheelchair.

## 2017-08-19 NOTE — ED Provider Notes (Signed)
[facility]    CSN: [id number] Arrival date & time: 08/19/17  1022   History   Chief Complaint Chief Complaint  Patient presents with  . Leg Swelling    HPI Natalie Goodman is a 84 y.o. female.   With history of hypertension, hyperlipidemia, CAD, MI, CKD Stage 3, presents to urgent care today for bilateral leg edema x 2 weeks, progressively getting worse. Patient is a poor historian. Sister is in the room with her but sister is also not a good historian. Patient denies leg pain, shortness of breath, chest pain, dizziness or headache. Patient endorses difficulty walking due to the leg swelling.Sister states that patient's legs have never been this swollen before. Patient denies taking a diuretic. She does have a cardiologist (She doesn't know the cardiologist's name).       Past Medical History:  Diagnosis Date  . Anemia   . CAD (coronary artery disease)    a. per patient report, unable to recall any cardiac catheterizations being performed or stents placed  b. NST in 2013 showing inferior and lateral wall scar with mild ischemia --> treated medically  . ESOPHAGEAL STRICTURE   . GERD   . Hyperlipidemia   . Hypertension   . KIDNEY DISEASE, CHRONIC, STAGE I   . MYOCARDIAL INFARCTION, HX OF   . OSTEOARTHRITIS   . PARESTHESIA     Patient Active Problem List   Diagnosis Date Noted  . Primary osteoarthritis of both knees 10/21/2016  . Deficiency anemia 11/29/2015  . Routine general medical examination at a health care facility 03/27/2014  . Eczema, dyshidrotic 04/01/2013  . Obesity (BMI 35.0-39.9 without comorbidity) 03/10/2013  . ESOPHAGEAL STRICTURE 06/27/2010  . GERD 06/12/2010  . KIDNEY DISEASE, CHRONIC, STAGE I 03/15/2010  . Hyperlipidemia with target LDL less than 160 03/01/2010  . Essential hypertension 03/01/2010  . Coronary atherosclerosis 03/01/2010  . OSTEOARTHRITIS 03/01/2010  . PARESTHESIA 03/01/2010    Past Surgical History:  Procedure Laterality  Date  . ABDOMINAL HYSTERECTOMY    . REVISION TOTAL KNEE ARTHROPLASTY     Bilateral  . TONSILLECTOMY      OB History    No data available       Home Medications    Prior to Admission medications   Medication Sig Start Date End Date Taking? Authorizing Provider  aspirin 325 MG tablet Take 325 mg by mouth daily.    [provider]  meloxicam (MOBIC) 7.5 MG tablet Take 1 tablet (7.5 mg total) by mouth daily. 01/30/17   Etta GrandchildJones, Thomas L, MD  metoprolol (LOPRESSOR) 50 MG tablet TAKE ONE TABLET BY MOUTH TWICE DAILY 11/12/16   Etta GrandchildJones, Thomas L, MD  rosuvastatin (CRESTOR) 10 MG tablet Take 1 tablet (10 mg total) by mouth daily. 05/26/17   Lewayne Buntingrenshaw, Brian S, MD  valsartan (DIOVAN) 160 MG tablet Take 1 tablet (160 mg total) by mouth daily. 11/13/16   Ellsworth LennoxStrader, Brittany M, PA-C    Family History Family History  Problem Relation Age of Onset  . Hypertension Mother   . Heart disease Mother        CHF  . Hypertension Father   . Alcohol abuse Neg Hx   . Cancer Neg Hx   . Stroke Neg Hx     Social History Social History   Tobacco Use  . Smoking status: Never Smoker  . Smokeless tobacco: Never Used  Substance Use Topics  . Alcohol use: No  . Drug use: No     Allergies  Codeine   Review of Systems Review of Systems  Constitutional: Negative for fever.  Eyes: Negative for visual disturbance.  Respiratory: Negative for cough, shortness of breath and wheezing.   Cardiovascular: Positive for leg swelling. Negative for chest pain and palpitations.  Gastrointestinal: Negative for abdominal pain.  Skin: Positive for rash (On legs).  Neurological: Negative for dizziness and headaches.     Physical Exam Triage Vital Signs ED Triage Vitals [08/19/17 1135]  Enc Vitals Group     BP (!) 151/63     Pulse Rate (!) 56     Resp 18     Temp (!) 97.1 F (36.2 C)     Temp Source Temporal     SpO2 100 %     Weight      Height      Head Circumference      Peak Flow      Pain  Score      Pain Loc      Pain Edu?      Excl. in GC?    No data found.  Updated Vital Signs BP (!) 151/63   Pulse (!) 56   Temp (!) 97.1 F (36.2 C) (Temporal)   Resp 18   SpO2 100%   Visual Acuity Right Eye Distance:   Left Eye Distance:   Bilateral Distance:    Right Eye Near:   Left Eye Near:    Bilateral Near:     Physical Exam  Constitutional:  In wheelchair, no acute distress   HENT:  Head: Normocephalic and atraumatic.  Cardiovascular: Normal rate, regular rhythm and normal heart sounds.  Pulses:      Radial pulses are 2+ on the right side, and 2+ on the left side.       Dorsalis pedis pulses are 1+ on the right side, and 1+ on the left side.  Questionable murmur; difficult to hear. Pitting edema noted on both legs and feet.  Red erythematous papules noted on the lower legs.    Pulmonary/Chest: Effort normal and breath sounds normal. She has no wheezes.  Abdominal: Soft. Bowel sounds are normal. There is no tenderness.  Musculoskeletal:  In wheelchair,      UC Treatments / Results  Labs (all labs ordered are listed, but only abnormal results are displayed) Labs Reviewed - No data to display  EKG  EKG Interpretation None       Radiology No results found.  Procedures Procedures (including critical care time)  Medications Ordered in UC Medications - No data to display   Initial Impression / Assessment and Plan / UC Course  I have reviewed the triage vital signs and the nursing notes.  Pertinent labs & imaging results that were available during my care of the patient were reviewed by me and considered in my medical decision making (see chart for details).   Final Clinical Impressions(s) / UC Diagnoses   Final diagnoses:  Leg edema   This is a 84 y.o. Female, with many medical histories, is a poor historian, presents today for 2 weeks duration of bilateral leg swelling that is progressively getting worse. Upon reviewing her labs, her  latest labs were 6 months ago.  Patient endorses difficult walking due to her swelling and she endorses difficult walking due to swelling. She denies any symptoms, however her sister reports that patient's leg have never been "this swollen before".  Patient most likely would need labs. Patient transferred to the emergency department for further comprehensive evaluation  and treatment.     ED Discharge Orders    None     Controlled Substance Prescriptions [place] Controlled Substance Registry consulted? Not Applicable   Lucia Estelle, NP 08/19/17 1240

## 2017-08-19 NOTE — ED Notes (Signed)
Pt and family provided with crackers and water, updated on wait

## 2017-08-19 NOTE — ED Provider Notes (Signed)
[facility] EMERGENCY DEPARTMENT Provider Note   CSN: [id number] Arrival date & time: 08/19/17  1246     History   Chief Complaint Chief Complaint  Patient presents with  . Leg Swelling    HPI Natalie Goodman is a 84 y.o. female.  Patient sent from urgent care.  For bilateral leg swelling.  Patient denies any shortness of breath chest pain or abdominal pain.  Patient states that the leg swelling is only been present for a couple weeks but it does appear chronic in nature.  Swelling is significant enough it makes it difficult for patient to walk.  Patient does have a cardiologist as well as a primary care doctor.      Past Medical History:  Diagnosis Date  . Anemia   . CAD (coronary artery disease)    a. per patient report, unable to recall any cardiac catheterizations being performed or stents placed  b. NST in 2013 showing inferior and lateral wall scar with mild ischemia --> treated medically  . ESOPHAGEAL STRICTURE   . GERD   . Hyperlipidemia   . Hypertension   . KIDNEY DISEASE, CHRONIC, STAGE I   . MYOCARDIAL INFARCTION, HX OF   . OSTEOARTHRITIS   . PARESTHESIA     Patient Active Problem List   Diagnosis Date Noted  . Primary osteoarthritis of both knees 10/21/2016  . Deficiency anemia 11/29/2015  . Routine general medical examination at a health care facility 03/27/2014  . Eczema, dyshidrotic 04/01/2013  . Obesity (BMI 35.0-39.9 without comorbidity) 03/10/2013  . ESOPHAGEAL STRICTURE 06/27/2010  . GERD 06/12/2010  . KIDNEY DISEASE, CHRONIC, STAGE I 03/15/2010  . Hyperlipidemia with target LDL less than 160 03/01/2010  . Essential hypertension 03/01/2010  . Coronary atherosclerosis 03/01/2010  . OSTEOARTHRITIS 03/01/2010  . PARESTHESIA 03/01/2010    Past Surgical History:  Procedure Laterality Date  . ABDOMINAL HYSTERECTOMY    . REVISION TOTAL KNEE ARTHROPLASTY     Bilateral  . TONSILLECTOMY      OB History    No data available        Home Medications    Prior to Admission medications   Medication Sig Start Date End Date Taking? Authorizing Provider  aspirin 325 MG tablet Take 325 mg by mouth daily.    [provider]  meloxicam (MOBIC) 7.5 MG tablet Take 1 tablet (7.5 mg total) by mouth daily. 01/30/17   Etta Grandchild, MD  metoprolol (LOPRESSOR) 50 MG tablet TAKE ONE TABLET BY MOUTH TWICE DAILY 11/12/16   Etta Grandchild, MD  rosuvastatin (CRESTOR) 10 MG tablet Take 1 tablet (10 mg total) by mouth daily. 05/26/17   Lewayne Bunting, MD  valsartan (DIOVAN) 160 MG tablet Take 1 tablet (160 mg total) by mouth daily. 11/13/16   Ellsworth Lennox, PA-C    Family History Family History  Problem Relation Age of Onset  . Hypertension Mother   . Heart disease Mother        CHF  . Hypertension Father   . Alcohol abuse Neg Hx   . Cancer Neg Hx   . Stroke Neg Hx     Social History Social History   Tobacco Use  . Smoking status: Never Smoker  . Smokeless tobacco: Never Used  Substance Use Topics  . Alcohol use: No  . Drug use: No     Allergies   Codeine   Review of Systems Review of Systems  Constitutional: Negative for fever.  HENT:  Negative for congestion.   Eyes: Negative for redness.  Respiratory: Negative for shortness of breath.   Cardiovascular: Positive for leg swelling. Negative for chest pain.  Gastrointestinal: Negative for abdominal pain.  Genitourinary: Negative for dysuria.  Musculoskeletal: Negative for myalgias.  Skin: Positive for wound.  Neurological: Negative for headaches.  Hematological: Does not bruise/bleed easily.     Physical Exam Updated Vital Signs BP 130/84 (BP Location: Left Arm)   Pulse 62   Resp 18   SpO2 100%   Physical Exam  Constitutional: She is oriented to person, place, and time. She appears well-developed and well-nourished. No distress.  HENT:  Head: Normocephalic and atraumatic.  Mouth/Throat: Oropharynx is clear and moist.  Eyes:  Conjunctivae and EOM are normal. Pupils are equal, round, and reactive to light.  Neck: Normal range of motion. Neck supple.  Cardiovascular: Normal rate, regular rhythm and normal heart sounds.  Pulmonary/Chest: Effort normal and breath sounds normal. No respiratory distress. She has no wheezes.  Abdominal: Soft. Bowel sounds are normal. There is no tenderness.  Musculoskeletal: She exhibits edema.  Bilateral leg swelling.  With some anterior erythema suggestive of chronic leg edema.  A few very small 2 mm 3 mm sores with a second infection.  Both legs are equally swollen.  It does extend up into the thigh area.  Neurological: She is alert and oriented to person, place, and time.  Skin: Skin is warm.  Nursing note and vitals reviewed.    ED Treatments / Results  Labs (all labs ordered are listed, but only abnormal results are displayed) Labs Reviewed  COMPREHENSIVE METABOLIC PANEL - Abnormal; Notable for the following components:      Result Value   Potassium 3.1 (*)    Albumin 3.0 (*)    GFR calc non Af Amer 56 (*)    All other components within normal limits  CBC WITH DIFFERENTIAL/PLATELET - Abnormal; Notable for the following components:   RBC 3.80 (*)    Hemoglobin 11.2 (*)    HCT 35.5 (*)    RDW 16.2 (*)    Platelets 136 (*)    All other components within normal limits    EKG  EKG Interpretation None       Radiology Dg Chest 2 View  Result Date: 08/19/2017 CLINICAL DATA:  Pt c/o leg swelling and SOB x 1 week. Hx of CAD, HTN, AND MI. Pt is a nonsmoker. EXAM: CHEST  2 VIEW COMPARISON:  Chest CT, 11/08/2014.  Chest radiographs, 10/29/2013. FINDINGS: Cardiopericardial silhouette is mildly enlarged. No mediastinal or hilar masses. No convincing adenopathy. There are prominent bronchovascular markings. This is most evident in the left lower lobe at the left lung base. This is similar to the prior chest CT, consistent with chronic bronchitic change. There is no convincing  pneumonia and no evidence of pulmonary edema. No pleural effusion or pneumothorax. Skeletal structures are demineralized but grossly intact. IMPRESSION: 1. No acute cardiopulmonary disease. 2. Mild cardiomegaly. Electronically Signed   By: Amie Portlandavid  Ormond M.D.   On: 08/19/2017 18:12    Procedures Procedures (including critical care time)  Medications Ordered in ED Medications - No data to display   Initial Impression / Assessment and Plan / ED Course  I have reviewed the triage vital signs and the nursing notes.  Pertinent labs & imaging results that were available during my care of the patient were reviewed by me and considered in my medical decision making (see chart for details).    Workup  lab wise and chest x-ray without evidence of renal functions normal.  Patient hemodynamically stable can be discharged home for follow-up with primary care doctor and/or cardiologist but I think primary care doctor can see her in follow-up.  Patient is on Diovan.  Patient may require some adjustments of meds.  Patient is not on a traditional diuretic.   Final Clinical Impressions(s) / ED Diagnoses   Final diagnoses:  Bilateral lower extremity edema    ED Discharge Orders    None       Vanetta Mulders, MD 08/19/17 2037

## 2017-08-25 ENCOUNTER — Encounter: Payer: Self-pay | Admitting: Internal Medicine

## 2017-08-25 ENCOUNTER — Ambulatory Visit (INDEPENDENT_AMBULATORY_CARE_PROVIDER_SITE_OTHER): Payer: Medicare HMO | Admitting: Internal Medicine

## 2017-08-25 VITALS — BP 128/70 | HR 54 | Temp 97.4°F | Resp 16 | Ht 65.0 in | Wt 207.0 lb

## 2017-08-25 DIAGNOSIS — E876 Hypokalemia: Secondary | ICD-10-CM | POA: Diagnosis not present

## 2017-08-25 DIAGNOSIS — I5022 Chronic systolic (congestive) heart failure: Secondary | ICD-10-CM | POA: Diagnosis not present

## 2017-08-25 DIAGNOSIS — N181 Chronic kidney disease, stage 1: Secondary | ICD-10-CM

## 2017-08-25 DIAGNOSIS — R7989 Other specified abnormal findings of blood chemistry: Secondary | ICD-10-CM | POA: Diagnosis not present

## 2017-08-25 DIAGNOSIS — I10 Essential (primary) hypertension: Secondary | ICD-10-CM | POA: Diagnosis not present

## 2017-08-25 DIAGNOSIS — D539 Nutritional anemia, unspecified: Secondary | ICD-10-CM | POA: Diagnosis not present

## 2017-08-25 MED ORDER — SPIRONOLACTONE 25 MG PO TABS: 25.0000 mg | ORAL_TABLET | Freq: Every day | ORAL | 0 refills | Status: DC

## 2017-08-25 NOTE — Progress Notes (Signed)
Subjective:  Patient ID: Natalie Goodman, female    DOB: 01-31-1925  Age: 84 y.o. MRN: [id number]  CC: Hypertension and Anemia   HPI Natalie JunglingRose F Wessner presents for f/up - she was recently seen in the ED for lower extremity edema and difficulty walking.  She has become wheelchair-bound.  She denies chest pain, shortness of breath, or palpitations.  Outpatient Medications Prior to Visit  Medication Sig Dispense Refill  . aspirin 325 MG tablet Take 325 mg by mouth daily.    . meloxicam (MOBIC) 7.5 MG tablet Take 1 tablet (7.5 mg total) by mouth daily. 90 tablet 1  . metoprolol (LOPRESSOR) 50 MG tablet TAKE ONE TABLET BY MOUTH TWICE DAILY (Patient not taking: Reported on 08/25/2017) 180 tablet 3  . rosuvastatin (CRESTOR) 10 MG tablet Take 1 tablet (10 mg total) by mouth daily. (Patient not taking: Reported on 08/25/2017) 30 tablet 9  . valsartan (DIOVAN) 160 MG tablet Take 1 tablet (160 mg total) by mouth daily. (Patient not taking: Reported on 08/25/2017) 90 tablet 3   No facility-administered medications prior to visit.     ROS Review of Systems  Constitutional: Positive for activity change. Negative for appetite change, diaphoresis and fatigue.  HENT: Negative.   Eyes: Negative for visual disturbance.  Respiratory: Negative for cough, chest tightness, shortness of breath and wheezing.   Cardiovascular: Positive for leg swelling. Negative for chest pain and palpitations.  Gastrointestinal: Negative for abdominal pain, constipation, diarrhea, nausea and vomiting.  Endocrine: Negative.   Genitourinary: Negative.  Negative for difficulty urinating.  Musculoskeletal: Negative.  Negative for back pain and myalgias.  Skin: Negative.  Negative for color change and pallor.  Neurological: Negative.  Negative for dizziness, weakness and light-headedness.  Hematological: Negative for adenopathy. Does not bruise/bleed easily.  Psychiatric/Behavioral: Negative.     Objective:  BP 128/70 (BP Location:  Left Arm, Patient Position: Sitting, Cuff Size: Normal)   Pulse (!) 54   Temp (!) 97.4 F (36.3 C) (Oral)   Resp 16   Ht 5\' 5"  (1.651 m)   Wt 207 lb (93.9 kg)   SpO2 99%   BMI 34.45 kg/m   BP Readings from Last 3 Encounters:  08/25/17 128/70  08/19/17 (!) 147/53  08/19/17 (!) 151/63    Wt Readings from Last 3 Encounters:  08/25/17 207 lb (93.9 kg)  11/13/16 236 lb (107 kg)  10/21/16 226 lb (102.5 kg)    Physical Exam  Constitutional: No distress.  HENT:  Mouth/Throat: Oropharynx is clear and moist. No oropharyngeal exudate.  Eyes: Conjunctivae are normal. Left eye exhibits no discharge. No scleral icterus.  Neck: Normal range of motion. Neck supple. No JVD present. No thyromegaly present.  Cardiovascular: Normal rate, regular rhythm and normal heart sounds. Exam reveals no gallop.  No murmur heard. Pulmonary/Chest: Effort normal and breath sounds normal. No respiratory distress. She has no wheezes. She has no rales.  Abdominal: Soft. Bowel sounds are normal. She exhibits no distension and no mass. There is no tenderness.  Musculoskeletal: She exhibits edema. She exhibits no tenderness.  There is 2+, bilateral lower extremity pitting edema.  There is serous fluid oozing posteriorly over both calves, more on the right than the left.  Lymphadenopathy:    She has no cervical adenopathy.  Neurological: She is alert.  Skin: Skin is warm and dry. No rash noted. She is not diaphoretic. No erythema. No pallor.  Vitals reviewed.   Lab Results  Component Value Date   WBC  6.2 08/19/2017   HGB 11.2 (L) 08/19/2017   HCT 35.5 (L) 08/19/2017   PLT 136 (L) 08/19/2017   GLUCOSE 81 08/19/2017   CHOL 134 11/29/2015   TRIG 86.0 11/29/2015   HDL 43.40 11/29/2015   LDLDIRECT 144.3 12/04/2011   LDLCALC 74 11/29/2015   ALT 24 08/19/2017   AST 40 08/19/2017   NA 144 08/19/2017   K 3.1 (L) 08/19/2017   CL 109 08/19/2017   CREATININE 0.88 08/19/2017   BUN 9 08/19/2017   CO2 24  08/19/2017   TSH 4.94 (H) 11/29/2015    Dg Chest 2 View  Result Date: 08/19/2017 CLINICAL DATA:  Pt c/o leg swelling and SOB x 1 week. Hx of CAD, HTN, AND MI. Pt is a nonsmoker. EXAM: CHEST  2 VIEW COMPARISON:  Chest CT, 11/08/2014.  Chest radiographs, 10/29/2013. FINDINGS: Cardiopericardial silhouette is mildly enlarged. No mediastinal or hilar masses. No convincing adenopathy. There are prominent bronchovascular markings. This is most evident in the left lower lobe at the left lung base. This is similar to the prior chest CT, consistent with chronic bronchitic change. There is no convincing pneumonia and no evidence of pulmonary edema. No pleural effusion or pneumothorax. Skeletal structures are demineralized but grossly intact. IMPRESSION: 1. No acute cardiopulmonary disease. 2. Mild cardiomegaly. Electronically Signed   By: Amie Portland M.D.   On: 08/19/2017 18:12    Assessment & Plan:   Triniti was seen today for hypertension and anemia.  Diagnoses and all orders for this visit:  KIDNEY DISEASE, CHRONIC, STAGE I- Her renal function is stable.  She agrees to avoid nephrotoxic agents. -     Basic metabolic panel; Future  Essential hypertension- Will discontinue the ARB and start a potassium sparing diuretic. -     Basic metabolic panel; Future -     spironolactone (ALDACTONE) 25 MG tablet; Take 1 tablet (25 mg total) by mouth daily.  Deficiency anemia- Will recheck her H&H and screen for vitamin deficiencies. -     CBC with Differential/Platelet; Future -     IBC panel; Future -     Vitamin B1; Future -     Folate; Future -     Ferritin; Future -     Vitamin B12; Future  TSH elevation- Will recheck her TFTs to see if she has developed hypothyroidism. -     Thyroid Panel With TSH; Future  Hypokalemia- Her recent potassium level was down to 3.1.  Will start a potassium sparing diuretic. -     Basic metabolic panel; Future -     Magnesium; Future -     spironolactone (ALDACTONE) 25  MG tablet; Take 1 tablet (25 mg total) by mouth daily.  Chronic systolic congestive heart failure (HCC)- Will start spironolactone to manage the fluid and the hypokalemia.  I have also sent a referral for home health care. -     Ambulatory referral to Home Health   I have discontinued Lollie F. Salinger's metoprolol tartrate, valsartan, meloxicam, and rosuvastatin. I am also having her start on spironolactone. Additionally, I am having her maintain her aspirin.  Meds ordered this encounter  Medications  . spironolactone (ALDACTONE) 25 MG tablet    Sig: Take 1 tablet (25 mg total) by mouth daily.    Dispense:  90 tablet    Refill:  0     Follow-up: Return in about 6 weeks (around 10/06/2017).  Sanda Linger, MD

## 2017-08-25 NOTE — Patient Instructions (Signed)
Edema Edema is an abnormal buildup of fluids in your bodytissues. Edema is somewhatdependent on gravity to pull the fluid to the lowest place in your body. That makes the condition more common in the legs and thighs (lower extremities). Painless swelling of the feet and ankles is common and becomes more likely as you get older. It is also common in looser tissues, like around your eyes. When the affected area is squeezed, the fluid may move out of that spot and leave a dent for a few moments. This dent is called pitting. What are the causes? There are many possible causes of edema. Eating too much salt and being on your feet or sitting for a long time can cause edema in your legs and ankles. Hot weather may make edema worse. Common medical causes of edema include:  Heart failure.  Liver disease.  Kidney disease.  Weak blood vessels in your legs.  Cancer.  An injury.  Pregnancy.  Some medications.  Obesity.  What are the signs or symptoms? Edema is usually painless.Your skin may look swollen or shiny. How is this diagnosed? Your health care provider may be able to diagnose edema by asking about your medical history and doing a physical exam. You may need to have tests such as X-rays, an electrocardiogram, or blood tests to check for medical conditions that may cause edema. How is this treated? Edema treatment depends on the cause. If you have heart, liver, or kidney disease, you need the treatment appropriate for these conditions. General treatment may include:  Elevation of the affected body part above the level of your heart.  Compression of the affected body part. Pressure from elastic bandages or support stockings squeezes the tissues and forces fluid back into the blood vessels. This keeps fluid from entering the tissues.  Restriction of fluid and salt intake.  Use of a water pill (diuretic). These medications are appropriate only for some types of edema. They pull fluid  out of your body and make you urinate more often. This gets rid of fluid and reduces swelling, but diuretics can have side effects. Only use diuretics as directed by your health care provider.  Follow these instructions at home:  Keep the affected body part above the level of your heart when you are lying down.  Do not sit still or stand for prolonged periods.  Do not put anything directly under your knees when lying down.  Do not wear constricting clothing or garters on your upper legs.  Exercise your legs to work the fluid back into your blood vessels. This may help the swelling go down.  Wear elastic bandages or support stockings to reduce ankle swelling as directed by your health care provider.  Eat a low-salt diet to reduce fluid if your health care provider recommends it.  Only take medicines as directed by your health care provider. Contact a health care provider if:  Your edema is not responding to treatment.  You have heart, liver, or kidney disease and notice symptoms of edema.  You have edema in your legs that does not improve after elevating them.  You have sudden and unexplained weight gain. Get help right away if:  You develop shortness of breath or chest pain.  You cannot breathe when you lie down.  You develop pain, redness, or warmth in the swollen areas.  You have heart, liver, or kidney disease and suddenly get edema.  You have a fever and your symptoms suddenly get worse. This information is   not intended to replace advice given to you by your health care provider. Make sure you discuss any questions you have with your health care provider. Document Released: 07/01/2005 Document Revised: 12/07/2015 Document Reviewed: 04/23/2013 Elsevier Interactive Patient Education  2017 [employer] Inc[employer]  

## 2017-08-26 DIAGNOSIS — I5032 Chronic diastolic (congestive) heart failure: Secondary | ICD-10-CM | POA: Insufficient documentation

## 2017-08-27 DIAGNOSIS — E876 Hypokalemia: Secondary | ICD-10-CM | POA: Diagnosis not present

## 2017-08-27 DIAGNOSIS — I130 Hypertensive heart and chronic kidney disease with heart failure and stage 1 through stage 4 chronic kidney disease, or unspecified chronic kidney disease: Secondary | ICD-10-CM | POA: Diagnosis not present

## 2017-08-27 DIAGNOSIS — R238 Other skin changes: Secondary | ICD-10-CM | POA: Diagnosis not present

## 2017-08-27 DIAGNOSIS — I5022 Chronic systolic (congestive) heart failure: Secondary | ICD-10-CM | POA: Diagnosis not present

## 2017-08-27 DIAGNOSIS — D509 Iron deficiency anemia, unspecified: Secondary | ICD-10-CM | POA: Diagnosis not present

## 2017-08-27 DIAGNOSIS — N181 Chronic kidney disease, stage 1: Secondary | ICD-10-CM | POA: Diagnosis not present

## 2017-08-28 ENCOUNTER — Telehealth: Payer: Self-pay | Admitting: Internal Medicine

## 2017-08-28 DIAGNOSIS — I5022 Chronic systolic (congestive) heart failure: Secondary | ICD-10-CM | POA: Diagnosis not present

## 2017-08-28 DIAGNOSIS — R238 Other skin changes: Secondary | ICD-10-CM | POA: Diagnosis not present

## 2017-08-28 DIAGNOSIS — E876 Hypokalemia: Secondary | ICD-10-CM | POA: Diagnosis not present

## 2017-08-28 DIAGNOSIS — D509 Iron deficiency anemia, unspecified: Secondary | ICD-10-CM | POA: Diagnosis not present

## 2017-08-28 DIAGNOSIS — N181 Chronic kidney disease, stage 1: Secondary | ICD-10-CM | POA: Diagnosis not present

## 2017-08-28 DIAGNOSIS — I130 Hypertensive heart and chronic kidney disease with heart failure and stage 1 through stage 4 chronic kidney disease, or unspecified chronic kidney disease: Secondary | ICD-10-CM | POA: Diagnosis not present

## 2017-08-28 NOTE — Telephone Encounter (Signed)
[facility] and gave verbal orders as requested.

## 2017-08-28 NOTE — Telephone Encounter (Unsigned)
Copied from CRM [phone]. Topic: Quick Communication - See Telephone Encounter >> Aug 28, 2017 10:49 AM Floria RavelingStovall, Shana A wrote: CRM for notification. See Telephone encounter for:  Dois [facility]Sandra [facility] [phone] Need verbal for a 2 layer compression system for her leg Order for OT, medical social worker and home health aid  5 skilled nursing visits   08/28/17.

## 2017-08-29 ENCOUNTER — Encounter (HOSPITAL_COMMUNITY): Payer: Self-pay | Admitting: *Deleted

## 2017-08-29 ENCOUNTER — Telehealth: Payer: Self-pay | Admitting: Internal Medicine

## 2017-08-29 ENCOUNTER — Inpatient Hospital Stay (HOSPITAL_COMMUNITY)
Admission: EM | Admit: 2017-08-29 | Discharge: 2017-09-03 | DRG: 291 | Disposition: A | Discharge status: Skilled Nursing Facility | Payer: Medicare HMO | Attending: Internal Medicine | Admitting: Internal Medicine

## 2017-08-29 ENCOUNTER — Emergency Department (HOSPITAL_COMMUNITY): Payer: Medicare HMO

## 2017-08-29 ENCOUNTER — Other Ambulatory Visit: Payer: Self-pay

## 2017-08-29 DIAGNOSIS — N182 Chronic kidney disease, stage 2 (mild): Secondary | ICD-10-CM | POA: Diagnosis present

## 2017-08-29 DIAGNOSIS — N3000 Acute cystitis without hematuria: Secondary | ICD-10-CM

## 2017-08-29 DIAGNOSIS — I5033 Acute on chronic diastolic (congestive) heart failure: Secondary | ICD-10-CM | POA: Diagnosis present

## 2017-08-29 DIAGNOSIS — I130 Hypertensive heart and chronic kidney disease with heart failure and stage 1 through stage 4 chronic kidney disease, or unspecified chronic kidney disease: Principal | ICD-10-CM | POA: Diagnosis present

## 2017-08-29 DIAGNOSIS — N390 Urinary tract infection, site not specified: Secondary | ICD-10-CM

## 2017-08-29 DIAGNOSIS — Z833 Family history of diabetes mellitus: Secondary | ICD-10-CM

## 2017-08-29 DIAGNOSIS — I2510 Atherosclerotic heart disease of native coronary artery without angina pectoris: Secondary | ICD-10-CM | POA: Diagnosis present

## 2017-08-29 DIAGNOSIS — I252 Old myocardial infarction: Secondary | ICD-10-CM

## 2017-08-29 DIAGNOSIS — I129 Hypertensive chronic kidney disease with stage 1 through stage 4 chronic kidney disease, or unspecified chronic kidney disease: Secondary | ICD-10-CM | POA: Diagnosis not present

## 2017-08-29 DIAGNOSIS — E1122 Type 2 diabetes mellitus with diabetic chronic kidney disease: Secondary | ICD-10-CM | POA: Diagnosis not present

## 2017-08-29 DIAGNOSIS — J90 Pleural effusion, not elsewhere classified: Secondary | ICD-10-CM | POA: Diagnosis not present

## 2017-08-29 DIAGNOSIS — Z6831 Body mass index (BMI) 31.0-31.9, adult: Secondary | ICD-10-CM | POA: Diagnosis not present

## 2017-08-29 DIAGNOSIS — I110 Hypertensive heart disease with heart failure: Secondary | ICD-10-CM | POA: Diagnosis not present

## 2017-08-29 DIAGNOSIS — R531 Weakness: Secondary | ICD-10-CM | POA: Diagnosis not present

## 2017-08-29 DIAGNOSIS — Z9181 History of falling: Secondary | ICD-10-CM | POA: Diagnosis not present

## 2017-08-29 DIAGNOSIS — I5031 Acute diastolic (congestive) heart failure: Secondary | ICD-10-CM | POA: Diagnosis not present

## 2017-08-29 DIAGNOSIS — R404 Transient alteration of awareness: Secondary | ICD-10-CM | POA: Diagnosis not present

## 2017-08-29 DIAGNOSIS — Z66 Do not resuscitate: Secondary | ICD-10-CM | POA: Diagnosis present

## 2017-08-29 DIAGNOSIS — Z7982 Long term (current) use of aspirin: Secondary | ICD-10-CM

## 2017-08-29 DIAGNOSIS — E870 Hyperosmolality and hypernatremia: Secondary | ICD-10-CM | POA: Diagnosis not present

## 2017-08-29 DIAGNOSIS — R1312 Dysphagia, oropharyngeal phase: Secondary | ICD-10-CM | POA: Diagnosis not present

## 2017-08-29 DIAGNOSIS — E876 Hypokalemia: Secondary | ICD-10-CM | POA: Diagnosis present

## 2017-08-29 DIAGNOSIS — I509 Heart failure, unspecified: Secondary | ICD-10-CM

## 2017-08-29 DIAGNOSIS — Z8249 Family history of ischemic heart disease and other diseases of the circulatory system: Secondary | ICD-10-CM | POA: Diagnosis not present

## 2017-08-29 DIAGNOSIS — T502X5A Adverse effect of carbonic-anhydrase inhibitors, benzothiadiazides and other diuretics, initial encounter: Secondary | ICD-10-CM | POA: Diagnosis present

## 2017-08-29 DIAGNOSIS — Z96653 Presence of artificial knee joint, bilateral: Secondary | ICD-10-CM | POA: Diagnosis present

## 2017-08-29 DIAGNOSIS — M6281 Muscle weakness (generalized): Secondary | ICD-10-CM | POA: Diagnosis not present

## 2017-08-29 DIAGNOSIS — I361 Nonrheumatic tricuspid (valve) insufficiency: Secondary | ICD-10-CM | POA: Diagnosis not present

## 2017-08-29 DIAGNOSIS — M7989 Other specified soft tissue disorders: Secondary | ICD-10-CM | POA: Diagnosis not present

## 2017-08-29 DIAGNOSIS — R262 Difficulty in walking, not elsewhere classified: Secondary | ICD-10-CM | POA: Diagnosis not present

## 2017-08-29 DIAGNOSIS — R4182 Altered mental status, unspecified: Secondary | ICD-10-CM | POA: Diagnosis not present

## 2017-08-29 DIAGNOSIS — E669 Obesity, unspecified: Secondary | ICD-10-CM | POA: Diagnosis present

## 2017-08-29 DIAGNOSIS — L89322 Pressure ulcer of left buttock, stage 2: Secondary | ICD-10-CM | POA: Diagnosis not present

## 2017-08-29 DIAGNOSIS — R609 Edema, unspecified: Secondary | ICD-10-CM | POA: Diagnosis not present

## 2017-08-29 DIAGNOSIS — L97919 Non-pressure chronic ulcer of unspecified part of right lower leg with unspecified severity: Secondary | ICD-10-CM | POA: Diagnosis present

## 2017-08-29 DIAGNOSIS — R488 Other symbolic dysfunctions: Secondary | ICD-10-CM | POA: Diagnosis not present

## 2017-08-29 LAB — CBC
HCT: 35.5 % — ABNORMAL LOW (ref 36.0–46.0)
HCT: 36.9 % (ref 36.0–46.0)
HEMOGLOBIN: 11.6 g/dL — AB (ref 12.0–15.0)
Hemoglobin: 12.4 g/dL (ref 12.0–15.0)
MCH: 30.5 pg (ref 26.0–34.0)
MCH: 31.1 pg (ref 26.0–34.0)
MCHC: 32.7 g/dL (ref 30.0–36.0)
MCHC: 33.6 g/dL (ref 30.0–36.0)
MCV: 93.4 fL (ref 78.0–100.0)
MCV: 92.5 fL (ref 78.0–100.0)
PLATELETS: 139 10*3/uL — AB (ref 150–400)
PLATELETS: 149 10*3/uL — AB (ref 150–400)
RBC: 3.8 MIL/uL — AB (ref 3.87–5.11)
RBC: 3.99 MIL/uL (ref 3.87–5.11)
RDW: 16.6 % — ABNORMAL HIGH (ref 11.5–15.5)
RDW: 16.4 % — AB (ref 11.5–15.5)
WBC: 6.6 10*3/uL (ref 4.0–10.5)
WBC: 8 10*3/uL (ref 4.0–10.5)

## 2017-08-29 LAB — URINALYSIS, ROUTINE W REFLEX MICROSCOPIC
Bilirubin Urine: NEGATIVE
Glucose, UA: NEGATIVE mg/dL
Ketones, ur: 20 mg/dL — AB
Leukocytes, UA: NEGATIVE
Nitrite: POSITIVE — AB
PROTEIN: 30 mg/dL — AB
Specific Gravity, Urine: 1.014 (ref 1.005–1.030)
pH: 5 (ref 5.0–8.0)

## 2017-08-29 LAB — CREATININE, SERUM
Creatinine, Ser: 0.87 mg/dL (ref 0.44–1.00)
GFR calc Af Amer: >60 mL/min (ref >60)
GFR calc non Af Amer: 57 mL/min — ABNORMAL LOW (ref >60)

## 2017-08-29 LAB — COMPREHENSIVE METABOLIC PANEL
ALBUMIN: 3.1 g/dL — AB (ref 3.5–5.0)
ALK PHOS: 61 U/L (ref 38–126)
ALT: 30 U/L (ref 14–54)
AST: 48 U/L — AB (ref 15–41)
Anion gap: 15 (ref 5–15)
BUN: 14 mg/dL (ref 6–20)
CHLORIDE: 105 mmol/L (ref 101–111)
CO2: 26 mmol/L (ref 22–32)
Calcium: 10 mg/dL (ref 8.9–10.3)
Creatinine, Ser: 0.94 mg/dL (ref 0.44–1.00)
GFR calc Af Amer: >60 mL/min (ref >60)
GFR calc non Af Amer: 52 mL/min — ABNORMAL LOW (ref >60)
GLUCOSE: 65 mg/dL (ref 65–99)
Potassium: 3.3 mmol/L — ABNORMAL LOW (ref 3.5–5.1)
SODIUM: 146 mmol/L — AB (ref 135–145)
Total Bilirubin: 1.8 mg/dL — ABNORMAL HIGH (ref 0.3–1.2)
Total Protein: 6.8 g/dL (ref 6.5–8.1)

## 2017-08-29 LAB — BRAIN NATRIURETIC PEPTIDE: B NATRIURETIC PEPTIDE 5: 287.8 pg/mL — AB (ref 0.0–100.0)

## 2017-08-29 LAB — CK: Total CK: 268 U/L — ABNORMAL HIGH (ref 38–234)

## 2017-08-29 LAB — TROPONIN I: Troponin I: <0.03 ng/mL (ref <0.03)

## 2017-08-29 MED ORDER — CEFTRIAXONE SODIUM 1 G IJ SOLR
1.0000 g | INTRAMUSCULAR | Status: DC
  Administered 2017-08-30: 1 g via INTRAVENOUS
  Filled 2017-08-29: qty 1

## 2017-08-29 MED ORDER — ASPIRIN EC 81 MG PO TBEC: 81.0000 mg | DELAYED_RELEASE_TABLET | Freq: Every day | ORAL | Status: DC

## 2017-08-29 MED ORDER — ONDANSETRON HCL 4 MG/2ML IJ SOLN: 4.0000 mg | Freq: Four times a day (QID) | INTRAMUSCULAR | Status: DC | PRN

## 2017-08-29 MED ORDER — IRBESARTAN 75 MG PO TABS
37.5000 mg | ORAL_TABLET | Freq: Every day | ORAL | Status: DC
  Administered 2017-08-29 – 2017-09-03 (×6): 37.5 mg via ORAL
  Filled 2017-08-29 (×6): qty 0.5

## 2017-08-29 MED ORDER — ASPIRIN 325 MG PO TABS
325.0000 mg | ORAL_TABLET | Freq: Every day | ORAL | Status: DC
  Administered 2017-08-29 – 2017-09-03 (×6): 325 mg via ORAL
  Filled 2017-08-29 (×6): qty 1

## 2017-08-29 MED ORDER — SPIRONOLACTONE 25 MG PO TABS
25.0000 mg | ORAL_TABLET | Freq: Every day | ORAL | Status: DC
  Administered 2017-08-29 – 2017-09-03 (×6): 25 mg via ORAL
  Filled 2017-08-29 (×6): qty 1

## 2017-08-29 MED ORDER — ACETAMINOPHEN 650 MG RE SUPP: 650.0000 mg | Freq: Four times a day (QID) | RECTAL | Status: DC | PRN

## 2017-08-29 MED ORDER — SODIUM CHLORIDE 0.9 % IV SOLN
1.0000 g | Freq: Once | INTRAVENOUS | Status: AC
  Administered 2017-08-29: 1 g via INTRAVENOUS
  Filled 2017-08-29: qty 10

## 2017-08-29 MED ORDER — ZOLPIDEM TARTRATE 5 MG PO TABS: 5.0000 mg | ORAL_TABLET | Freq: Every evening | ORAL | Status: DC | PRN

## 2017-08-29 MED ORDER — ONDANSETRON HCL 4 MG PO TABS: 4.0000 mg | ORAL_TABLET | Freq: Four times a day (QID) | ORAL | Status: DC | PRN

## 2017-08-29 MED ORDER — POTASSIUM CHLORIDE CRYS ER 20 MEQ PO TBCR
20.0000 meq | EXTENDED_RELEASE_TABLET | Freq: Two times a day (BID) | ORAL | Status: DC
  Administered 2017-08-29 – 2017-09-01 (×5): 20 meq via ORAL
  Filled 2017-08-29 (×5): qty 1

## 2017-08-29 MED ORDER — HEPARIN SODIUM (PORCINE) 5000 UNIT/ML IJ SOLN
5000.0000 [IU] | Freq: Three times a day (TID) | INTRAMUSCULAR | Status: DC
  Administered 2017-08-29 – 2017-09-03 (×14): 5000 [IU] via SUBCUTANEOUS
  Filled 2017-08-29 (×10): qty 1

## 2017-08-29 MED ORDER — FUROSEMIDE 10 MG/ML IJ SOLN
20.0000 mg | Freq: Once | INTRAMUSCULAR | Status: AC
  Administered 2017-08-29: 20 mg via INTRAVENOUS
  Filled 2017-08-29: qty 4

## 2017-08-29 MED ORDER — ACETAMINOPHEN 325 MG PO TABS: 650.0000 mg | ORAL_TABLET | Freq: Four times a day (QID) | ORAL | Status: DC | PRN

## 2017-08-29 MED ORDER — ENSURE ENLIVE PO LIQD
237.0000 mL | Freq: Two times a day (BID) | ORAL | Status: DC
  Administered 2017-08-30 – 2017-09-02 (×5): 237 mL via ORAL

## 2017-08-29 NOTE — Care Management Note (Addendum)
Case Management Note  Patient Details  Name: Natalie Goodman MRN: [id number] Date of Birth: [birth year]  CM [facility]  Contacted Corey who confirmed HH RN PT OT NA SW orders.  He reports that if pt is D/C home tonight they will see the pt before noon tomorrow.  Updated [place], PA.  Expected Discharge Date:   Unknown               Expected Discharge Plan:  Home w Home Health Services  Post Acute Care Choice:  Home Health Choice offered to:  Patient  HH Arranged:  RN, OT, Nurse's Aide, Social Work, PT [facility]  Status of Service:  In process, will continue to follow  Rica KoyanagiKritzer, Livy Ross N, RN 08/29/2017, 5:14 PM

## 2017-08-29 NOTE — H&P (Addendum)
[facility]                                                                                    Patient Demographics  Natalie Goodman, is a 84 y.o. female  CSN: [id number]  MRN: [id number]  DOB - Jul 11, 1925  Admit Date - 08/29/2017  Outpatient Primary MD for the patient is Janith Lima, MD   With History of -  Past Medical History:  Diagnosis Date  . Anemia   . CAD (coronary artery disease)    a. per patient report, unable to recall any cardiac catheterizations being performed or stents placed  b. NST in 2013 showing inferior and lateral wall scar with mild ischemia --> treated medically  . ESOPHAGEAL STRICTURE   . GERD   . Hyperlipidemia   . Hypertension   . KIDNEY DISEASE, CHRONIC, STAGE I   . MYOCARDIAL INFARCTION, HX OF   . OSTEOARTHRITIS   . PARESTHESIA       Past Surgical History:  Procedure Laterality Date  . ABDOMINAL HYSTERECTOMY    . REVISION TOTAL KNEE ARTHROPLASTY     Bilateral  . TONSILLECTOMY      in for   Chief Complaint  Patient presents with  . Leg Swelling     HPI  Natalie Goodman  is a 84 y.o. female, with past medical history significant for anemia, coronary artery disease and chronic kidney disease stage I presenting for evaluation for increasing weakness and fall at home that happened yesterday with increased peripheral edema especially in the lower extremities.  The patient lives alone and her sister who was recently diagnosed diabetic with right foot ulcer is not capable of taking care of her at this time.  In the emergency room the patient was noted to have mild congestive heart failure by chest x-ray and a UTI.  Patient was started on IV antibiotics and Lasix IV.  I met with the family and discussed goals of care.  They all agree on DNR including the patient .  Patient denies any chest pains palpitations nausea vomiting she is pleasantly confused and could not give significant history at this time    Review of Systems      Social  History Social History   Tobacco Use  . Smoking status: Never Smoker  . Smokeless tobacco: Never Used  Substance Use Topics  . Alcohol use: No     Family History Family History  Problem Relation Age of Onset  . Hypertension Mother   . Heart disease Mother        CHF  . Hypertension Father   . Alcohol abuse Neg Hx   . Cancer Neg Hx   . Stroke Neg Hx      Prior to Admission medications   Medication Sig Start Date End Date Taking? Authorizing Provider  aspirin 325 MG tablet Take 325 mg by mouth daily.   Yes [provider]  spironolactone (ALDACTONE) 25 MG tablet Take 1 tablet (25 mg total) by mouth daily. 08/25/17  Yes Janith Lima, MD  valsartan (DIOVAN) 160 MG tablet Take 160 mg by mouth daily.   Yes [provider]  Allergies  Allergen Reactions  . Codeine Nausea And Vomiting    Physical Exam  Vitals  Blood pressure (!) 134/105, pulse 70, temperature 98.2 F (36.8 C), temperature source Oral, resp. rate 20, height '5\' 5"'$  (1.651 m), weight 93.9 kg (207 lb), SpO2 99 %.   1. General well-developed, well-nourished female extremely pleasant family at bedside  2. Normal affect and insight, Not Suicidal or Homicidal, pleasantly confused.  3. No F.N deficits, grossly, patient moving all extremities.  4. Ears and Eyes appear Normal, Conjunctivae clear, PERRLA. Moist Oral Mucosa.  5. Supple Neck, No JVD, No cervical lymphadenopathy appriciated, No Carotid Bruits.  6. Symmetrical Chest wall movement, Good air movement bilaterally, CTAB.  7. RRR, No Gallops, Rubs or Murmurs, No Parasternal Heave.  8. Positive Bowel Sounds, Abdomen Soft, Non tender, No organomegaly appriciated,No rebound -guarding or rigidity.  9.  No Cyanosis, Normal Skin Turgor, No Skin Rash or Bruise.  10. Good muscle tone,  joints appear normal , lower extremity +2 edema noted with redness.    Data Review  CBC Recent Labs  Lab 08/29/17 1526  WBC 6.6  HGB 11.6*   HCT 35.5*  PLT 139*  MCV 93.4  MCH 30.5  MCHC 32.7  RDW 16.6*   ------------------------------------------------------------------------------------------------------------------  Chemistries  Recent Labs  Lab 08/29/17 1526  NA 146*  K 3.3*  CL 105  CO2 26  GLUCOSE 65  BUN 14  CREATININE 0.94  CALCIUM 10.0  AST 48*  ALT 30  ALKPHOS 61  BILITOT 1.8*   ------------------------------------------------------------------------------------------------------------------ estimated creatinine clearance is 45.1 mL/min (by C-G formula based on SCr of 0.94 mg/dL). ------------------------------------------------------------------------------------------------------------------ No results for input(s): TSH, T4TOTAL, T3FREE, THYROIDAB in the last 72 hours.  Invalid input(s): FREET3   Coagulation profile No results for input(s): INR, PROTIME in the last 168 hours. ------------------------------------------------------------------------------------------------------------------- No results for input(s): DDIMER in the last 72 hours. -------------------------------------------------------------------------------------------------------------------  Cardiac Enzymes No results for input(s): CKMB, TROPONINI, MYOGLOBIN in the last 168 hours.  Invalid input(s): CK ------------------------------------------------------------------------------------------------------------------ Invalid input(s): POCBNP   ---------------------------------------------------------------------------------------------------------------  Urinalysis    Component Value Date/Time   COLORURINE YELLOW 08/29/2017 [place] 08/29/2017 1617   LABSPEC 1.014 08/29/2017 1617   PHURINE 5.0 08/29/2017 1617   GLUCOSEU NEGATIVE 08/29/2017 1617   GLUCOSEU NEGATIVE 11/29/2015 1108   HGBUR SMALL (A) 08/29/2017 1617   BILIRUBINUR NEGATIVE 08/29/2017 1617   KETONESUR 20 (A) 08/29/2017 1617   PROTEINUR  30 (A) 08/29/2017 1617   UROBILINOGEN 0.2 11/29/2015 1108   NITRITE POSITIVE (A) 08/29/2017 1617   LEUKOCYTESUR NEGATIVE 08/29/2017 1617    ----------------------------------------------------------------------------------------------------------------     Imaging results:   Dg Chest 2 View  Result Date: 08/29/2017 CLINICAL DATA:  Difficulty walking EXAM: CHEST  2 VIEW COMPARISON:  08/19/2017 FINDINGS: Cardiac shadow is enlarged. Aortic calcifications are again seen. The lungs are well aerated bilaterally. Small bilateral pleural effusions are noted. Chronic appearing changes in the left retrocardiac region are noted. IMPRESSION: Small bilateral pleural effusions. Electronically Signed   By: Inez Catalina M.D.   On: 08/29/2017 16:52   Dg Chest 2 View  Result Date: 08/19/2017 CLINICAL DATA:  Pt c/o leg swelling and SOB x 1 week. Hx of CAD, HTN, AND MI. Pt is a nonsmoker. EXAM: CHEST  2 VIEW COMPARISON:  Chest CT, 11/08/2014.  Chest radiographs, 10/29/2013. FINDINGS: Cardiopericardial silhouette is mildly enlarged. No mediastinal or hilar masses. No convincing adenopathy. There are prominent bronchovascular markings. This is most evident in the left lower lobe  at the left lung base. This is similar to the prior chest CT, consistent with chronic bronchitic change. There is no convincing pneumonia and no evidence of pulmonary edema. No pleural effusion or pneumothorax. Skeletal structures are demineralized but grossly intact. IMPRESSION: 1. No acute cardiopulmonary disease. 2. Mild cardiomegaly. Electronically Signed   By: Lajean Manes M.D.   On: 08/19/2017 18:12    My personal review of EKG: Rhythm NSR, 63 bpm, nonspecific intraventricular conduction delay    Assessment & Plan  1.  ?Congestive heart failure lower extremity edema , bilateral pleural effusions with cardiomegaly on chest x-ray but no frank congestive heart failure on chest x-ray . Doubt CHF 2.  Urinary tract infection 3.   CAD 4.  Anemia 5.  Hypernatremia 6. Hypokalemia 7.  Bilateral lower extremity edema  Plan  Admit to telemetry D5W IV Rocephin Goals of care discussed with family and patient and they agreed to DNR Patient received Lasix IV in the emergency room.  Check BMP in a.m. Replete K Check bilateral Dopplers    DVT Prophylaxis Heparin   AM Labs Ordered, also please review Full Orders    Code Status DNR  Disposition Plan: Home with home health  Time spent in minutes : 42 minutes  Condition GUARDED   '@SIGNATURE'$ @

## 2017-08-29 NOTE — Telephone Encounter (Signed)
Routing to dr jones, fyi.... 

## 2017-08-29 NOTE — Telephone Encounter (Signed)
Copied from CRM [phone]#[id number]. Topic: Inquiry >> Aug 29, 2017  1:48 PM Crist InfanteHarrald, Kathy J wrote: Reason for CRM: Dois DavenportSandra with [facility] called to let the dr know pt had fallen last night. House was locked when she got there today.  police were called to enter the home and EMS is taking her to [facility] now. Pt states she had fallen last night and had been on the floor all night long. Dois DavenportSandra states pt is not safe to stay by herself anymore

## 2017-08-29 NOTE — ED Notes (Signed)
ED TO INPATIENT HANDOFF REPORT  Name/Age/Gender Natalie Goodman 84 y.o. female  Code Status Code Status History    This patient does not have a recorded code status. Please follow your organizational policy for patients in this situation.      Home/SNF/Other Nursing Home  Chief Complaint edema  Level of Care/Admitting Diagnosis ED Disposition    ED Disposition Condition [facility]: [place] [100102]  Level of Care: Telemetry [5]  Admit to tele based on following criteria: Acute CHF  Diagnosis: CHF (congestive heart failure) [facility]) [[id number]]  Admitting Physician: M[place] Border Marshal.Browner  Attending Physician: Laren Everts, ALI Marshal.Browner  Estimated length of stay: past midnight tomorrow  Certification:: I certify this patient will need inpatient services for at least 2 midnights  PT Class (Do Not Modify): Inpatient [101]  PT Acc Code (Do Not Modify): Private [1]       Medical History Past Medical History:  Diagnosis Date  . Anemia   . CAD (coronary artery disease)    a. per patient report, unable to recall any cardiac catheterizations being performed or stents placed  b. NST in 2013 showing inferior and lateral wall scar with mild ischemia --> treated medically  . ESOPHAGEAL STRICTURE   . GERD   . Hyperlipidemia   . Hypertension   . KIDNEY DISEASE, CHRONIC, STAGE I   . MYOCARDIAL INFARCTION, HX OF   . OSTEOARTHRITIS   . PARESTHESIA     Allergies Allergies  Allergen Reactions  . Codeine Nausea And Vomiting    IV Location/Drains/Wounds Patient Lines/Drains/Airways Status   Active Line/Drains/Airways    None          Labs/Imaging Results for orders placed or performed during the hospital encounter of 08/29/17 (from the past 48 hour(s))  CBC     Status: Abnormal   Collection Time: 08/29/17  3:26 PM  Result Value Ref Range   WBC 6.6 4.0 - 10.5 K/uL   RBC 3.80 (L) 3.87 - 5.11 MIL/uL   Hemoglobin 11.6 (L) 12.0 - 15.0 g/dL   HCT  35.5 (L) 36.0 - 46.0 %   MCV 93.4 78.0 - 100.0 fL   MCH 30.5 26.0 - 34.0 pg   MCHC 32.7 30.0 - 36.0 g/dL   RDW 16.6 (H) 11.5 - 15.5 %   Platelets 139 (L) 150 - 400 K/uL    Comment: Performed at [facility], [place] [address], [place], [place] [address]  Comprehensive metabolic panel     Status: Abnormal   Collection Time: 08/29/17  3:26 PM  Result Value Ref Range   Sodium 146 (H) 135 - 145 mmol/L   Potassium 3.3 (L) 3.5 - 5.1 mmol/L   Chloride 105 101 - 111 mmol/L   CO2 26 22 - 32 mmol/L   Glucose, Bld 65 65 - 99 mg/dL   BUN 14 6 - 20 mg/dL   Creatinine, Ser 0.94 0.44 - 1.00 mg/dL   Calcium 10.0 8.9 - 10.3 mg/dL   Total Protein 6.8 6.5 - 8.1 g/dL   Albumin 3.1 (L) 3.5 - 5.0 g/dL   AST 48 (H) 15 - 41 U/L   ALT 30 14 - 54 U/L   Alkaline Phosphatase 61 38 - 126 U/L   Total Bilirubin 1.8 (H) 0.3 - 1.2 mg/dL   GFR calc non Af Amer 52 (L) >60 mL/min   GFR calc Af Amer >60 >60 mL/min    Comment: (NOTE) The eGFR has been  calculated using the CKD EPI equation. This calculation has not been validated in all clinical situations. eGFR's persistently <60 mL/min signify possible Chronic Kidney Disease.    Anion gap 15 5 - 15    Comment: Performed at [facility], [place] [address], [place], [place] [address]  CK     Status: Abnormal   Collection Time: 08/29/17  3:26 PM  Result Value Ref Range   Total CK 268 (H) 38 - 234 U/L    Comment: Performed at [facility], [place] [address] [place], [place] [address]  Brain natriuretic peptide     Status: Abnormal   Collection Time: 08/29/17  3:26 PM  Result Value Ref Range   B Natriuretic Peptide 287.8 (H) 0.0 - 100.0 pg/mL    Comment: Performed at [facility], [place] [address] [place], [place] [address]  Urinalysis, Routine w reflex microscopic     Status: Abnormal   Collection Time: 08/29/17  4:17 PM  Result Value Ref Range   Color, Urine YELLOW YELLOW   APPearance CLEAR  CLEAR   Specific Gravity, Urine 1.014 1.005 - 1.030   pH 5.0 5.0 - 8.0   Glucose, UA NEGATIVE NEGATIVE mg/dL   Hgb urine dipstick SMALL (A) NEGATIVE   Bilirubin Urine NEGATIVE NEGATIVE   Ketones, ur 20 (A) NEGATIVE mg/dL   Protein, ur 30 (A) NEGATIVE mg/dL   Nitrite POSITIVE (A) NEGATIVE   Leukocytes, UA NEGATIVE NEGATIVE   RBC / HPF 0-5 0 - 5 RBC/hpf   WBC, UA 0-5 0 - 5 WBC/hpf   Bacteria, UA MANY (A) NONE SEEN   Squamous Epithelial / LPF 0-5 (A) NONE SEEN    Comment: Performed at [facility], [place] [address], [place], [place] [address]   Dg Chest 2 View  Result Date: 08/29/2017 CLINICAL DATA:  Difficulty walking EXAM: CHEST  2 VIEW COMPARISON:  08/19/2017 FINDINGS: Cardiac shadow is enlarged. Aortic calcifications are again seen. The lungs are well aerated bilaterally. Small bilateral pleural effusions are noted. Chronic appearing changes in the left retrocardiac region are noted. IMPRESSION: Small bilateral pleural effusions. Electronically Signed   By: Inez Catalina M.D.   On: 08/29/2017 16:52    Pending Labs [employer] (From admission, onward)   Start     Ordered   Signed and Held  CBC  (heparin)  Once,   R    Comments:  Baseline for heparin therapy IF NOT ALREADY DRAWN.  Notify MD if PLT < 100 K.    Signed and Held   Signed and Held  Creatinine, serum  (heparin)  Once,   R    Comments:  Baseline for heparin therapy IF NOT ALREADY DRAWN.    Signed and Held   Signed and Held  Troponin I  Now then every 6 hours,   R     Signed and Held   Signed and Held  Basic metabolic panel  Tomorrow morning,   R     Signed and Held      Vitals/Pain Today's Vitals   08/29/17 1430 08/29/17 1433 08/29/17 1728 08/29/17 1922  BP: (!) 144/73  (!) 144/58 (!) 134/105  Pulse: 69  64 70  Resp: '16  12 20  '$ Temp: 98.2 F (36.8 C)     TempSrc: Oral     SpO2: 99%  99% 99%  Weight:  207 lb (93.9 kg)    Height:  '5\' 5"'$  (1.651 m)  Isolation Precautions No active  isolations  Medications Medications  furosemide (LASIX) injection 20 mg (not administered)  cefTRIAXone (ROCEPHIN) 1 g in sodium chloride 0.9 % 100 mL IVPB (not administered)    Mobility non-ambulatory

## 2017-08-29 NOTE — ED Triage Notes (Signed)
Per EMS: Pt is coming from home. Pt lives at home alone. Pt fell last night and denies any neck and back pain. Pt denies pain from fall. Pt is alert and oritented  X4.  EMS had to break into her house in order to get to her because she was sitting on the floor. Pt is unable to walk well due to the swelling. Pt has home health care, but is not benefiting from it and is in need of being placed in a facility.  Pt has not had her medicine today.  Last Vitals: 166/107 HR 69 SPO2% 94% on RA 18 RR

## 2017-08-29 NOTE — ED Provider Notes (Signed)
[facility]-EMERGENCY DEPT Provider Note   CSN: [id number] Arrival date & time: 08/29/17  1404     History   Chief Complaint Chief Complaint  Patient presents with  . Leg Swelling    HPI Natalie Goodman is a 84 y.o. female presenting for evaluation of BLE edema and weakness.   Pt states she fell getting into bed last night, and was unable to get herself off the floor until PD broke down her door today to help lift her. Her HH nurse, Natalie Goodman, is concerned that the pt is not safe to live at home by herself. Pt reports worsening swelling and weakness of BLE over the past few weeks. She denies hitting her head or LOC. She is not on blood thinners. She denies pain from the fall currently, no head, neck, or back pain. She has not had any of her at home medications. She has a h/o CAD, CHF, CKD.  She reports increased urinary frequency.  She denies fevers, chills, cough, chest pain, shortness of breath, nausea, vomiting, abdominal pain.  HPI  Past Medical History:  Diagnosis Date  . Anemia   . CAD (coronary artery disease)    a. per patient report, unable to recall any cardiac catheterizations being performed or stents placed  b. NST in 2013 showing inferior and lateral wall scar with mild ischemia --> treated medically  . ESOPHAGEAL STRICTURE   . GERD   . Hyperlipidemia   . Hypertension   . KIDNEY DISEASE, CHRONIC, STAGE I   . MYOCARDIAL INFARCTION, HX OF   . OSTEOARTHRITIS   . PARESTHESIA     Patient Active Problem List   Diagnosis Date Noted  . CHF (congestive heart failure) (HCC) 08/29/2017  . Chronic systolic congestive heart failure (HCC) 08/26/2017  . TSH elevation 08/25/2017  . Hypokalemia 08/25/2017  . Primary osteoarthritis of both knees 10/21/2016  . Deficiency anemia 11/29/2015  . Routine general medical examination at a health care facility 03/27/2014  . Eczema, dyshidrotic 04/01/2013  . Obesity (BMI 35.0-39.9 without comorbidity) 03/10/2013  .  ESOPHAGEAL STRICTURE 06/27/2010  . GERD 06/12/2010  . KIDNEY DISEASE, CHRONIC, STAGE I 03/15/2010  . Hyperlipidemia with target LDL less than 160 03/01/2010  . Essential hypertension 03/01/2010  . Coronary atherosclerosis 03/01/2010  . PARESTHESIA 03/01/2010    Past Surgical History:  Procedure Laterality Date  . ABDOMINAL HYSTERECTOMY    . REVISION TOTAL KNEE ARTHROPLASTY     Bilateral  . TONSILLECTOMY      OB History    No data available       Home Medications    Prior to Admission medications   Medication Sig Start Date End Date Taking? Authorizing Provider  aspirin 325 MG tablet Take 325 mg by mouth daily.   Yes [provider]  spironolactone (ALDACTONE) 25 MG tablet Take 1 tablet (25 mg total) by mouth daily. 08/25/17  Yes Etta Grandchild, MD  valsartan (DIOVAN) 160 MG tablet Take 160 mg by mouth daily.   Yes [provider]    Family History Family History  Problem Relation Age of Onset  . Hypertension Mother   . Heart disease Mother        CHF  . Hypertension Father   . Alcohol abuse Neg Hx   . Cancer Neg Hx   . Stroke Neg Hx     Social History Social History   Tobacco Use  . Smoking status: Never Smoker  . Smokeless tobacco: Never Used  Substance Use Topics  . Alcohol use: No  . Drug use: No     Allergies   Codeine   Review of Systems Review of Systems  Cardiovascular: Positive for leg swelling.  Neurological: Positive for weakness.  All other systems reviewed and are negative.    Physical Exam Updated Vital Signs BP (!) 134/105 (BP Location: Right Arm)   Pulse 70   Temp 98.2 F (36.8 C) (Oral)   Resp 20   Ht 5\' 5"  (1.651 m)   Wt 93.9 kg (207 lb)   SpO2 99%   BMI 34.45 kg/m   Physical Exam  Constitutional: She is oriented to person, place, and time. She appears well-developed and well-nourished. No distress.  HENT:  Head: Normocephalic and atraumatic.  Eyes: EOM are normal.  Neck: Normal range of motion.    Cardiovascular: Normal rate, regular rhythm and intact distal pulses.  Pulmonary/Chest: Effort normal and breath sounds normal. No respiratory distress. She has no wheezes.  Patient speaking full sentences without difficulty. ?rales in lower fields.   Abdominal: Soft. She exhibits no distension and no mass. There is no tenderness. There is no guarding.  Musculoskeletal: She exhibits edema.  BLE edema. Weeping skin blisters of the posterior legs without signs of infection.  Full active range of motion of upper extremities without difficulty or pain  Neurological: She is alert and oriented to person, place, and time.  Skin: Skin is warm and dry.  Psychiatric: She has a normal mood and affect.  Nursing note and vitals reviewed.   ED Treatments / Results  Labs (all labs ordered are listed, but only abnormal results are displayed) Labs Reviewed  CBC - Abnormal; Notable for the following components:      Result Value   RBC 3.80 (*)    Hemoglobin 11.6 (*)    HCT 35.5 (*)    RDW 16.6 (*)    Platelets 139 (*)    All other components within normal limits  COMPREHENSIVE METABOLIC PANEL - Abnormal; Notable for the following components:   Sodium 146 (*)    Potassium 3.3 (*)    Albumin 3.1 (*)    AST 48 (*)    Total Bilirubin 1.8 (*)    GFR calc non Af Amer 52 (*)    All other components within normal limits  CK - Abnormal; Notable for the following components:   Total CK 268 (*)    All other components within normal limits  URINALYSIS, ROUTINE W REFLEX MICROSCOPIC - Abnormal; Notable for the following components:   Hgb urine dipstick SMALL (*)    Ketones, ur 20 (*)    Protein, ur 30 (*)    Nitrite POSITIVE (*)    Bacteria, UA MANY (*)    Squamous Epithelial / LPF 0-5 (*)    All other components within normal limits  BRAIN NATRIURETIC PEPTIDE - Abnormal; Notable for the following components:   B Natriuretic Peptide 287.8 (*)    All other components within normal limits    EKG   EKG Interpretation  Date/Time:  Friday August 29 2017 15:21:21 EST Ventricular Rate:  63 PR Interval:    QRS Duration: 131 QT Interval:  481 QTC Calculation: 493 R Axis:   13 Text Interpretation:  Sinus rhythm Borderline prolonged PR interval Nonspecific intraventricular conduction delay similar to prior 4/15 Confirmed by Meridee Score [phone]) on 08/29/2017 4:33:42 PM       Radiology Dg Chest 2 View  Result Date: 08/29/2017 CLINICAL DATA:  Difficulty walking EXAM: CHEST  2 VIEW COMPARISON:  08/19/2017 FINDINGS: Cardiac shadow is enlarged. Aortic calcifications are again seen. The lungs are well aerated bilaterally. Small bilateral pleural effusions are noted. Chronic appearing changes in the left retrocardiac region are noted. IMPRESSION: Small bilateral pleural effusions. Electronically Signed   By: Alcide CleverMark  Lukens M.D.   On: 08/29/2017 16:52    Procedures Procedures (including critical care time)  Medications Ordered in ED Medications  furosemide (LASIX) injection 20 mg (not administered)  cefTRIAXone (ROCEPHIN) 1 g in sodium chloride 0.9 % 100 mL IVPB (not administered)     Initial Impression / Assessment and Plan / ED Course  I have reviewed the triage vital signs and the nursing notes.  Pertinent labs & imaging results that were available during my care of the patient were reviewed by me and considered in my medical decision making (see chart for details).  Clinical Course as of Aug 29 1926  Fri Aug 29, 2017  [id number] 84 year old female who lives at home alone here after falling at home last evening and.  She has had increased peripheral edema in her legs for at least a few months and is having difficulty ambulating.  She is got some fluid blistering on the sides of the legs from the edema.  There is some concern from the patient and her sister that she is not capable of taking care of herself at home so were doing a medical workup and case management is also involved for  possible placement.  [MB]    Clinical Course User Index [MB] Terrilee FilesButler, Michael C, MD    Patient presenting for progressive bilateral leg weakness, swelling, and pain.  Physical exam shows patient is afebrile not tachycardic.  She appears nontoxic.  Bilateral lower edema with oozing blisters without signs of infection.  Concerned that patient cannot take care of herself at home.  Will obtain labs, BMP, x-ray, and urine. Case discussed with attending, Dr. Charm BargesButler evaluated the pt.   Urine positive for UTI.  BNP mildly elevated at 300.  CK normal.  Creatinine normal.  Chest x-ray read and interpreted by me.  Shows pleural effusions.  EKG reassuring.  Concern that patient is having CHF exacerbation.  Patient with UTI, possibly causing weakness.  Will start Lasix and ceftriaxone.  Will consult with hospitalist for admission.  Discussed with hospitalist, pt to be admitted.    Final Clinical Impressions(s) / ED Diagnoses   Final diagnoses:  Acute cystitis without hematuria  Weakness  Acute on chronic congestive heart failure, unspecified heart failure type [facility](HCC)    ED Discharge Orders    None       Alveria ApleyCaccavale, Xandra Laramee, PA-C 08/29/17 1928    Terrilee FilesButler, Michael C, MD 09/02/17 1027

## 2017-08-30 ENCOUNTER — Other Ambulatory Visit: Payer: Self-pay

## 2017-08-30 ENCOUNTER — Inpatient Hospital Stay (HOSPITAL_COMMUNITY): Payer: Medicare HMO

## 2017-08-30 DIAGNOSIS — N390 Urinary tract infection, site not specified: Secondary | ICD-10-CM

## 2017-08-30 DIAGNOSIS — I5031 Acute diastolic (congestive) heart failure: Secondary | ICD-10-CM

## 2017-08-30 DIAGNOSIS — E870 Hyperosmolality and hypernatremia: Secondary | ICD-10-CM

## 2017-08-30 DIAGNOSIS — I361 Nonrheumatic tricuspid (valve) insufficiency: Secondary | ICD-10-CM

## 2017-08-30 LAB — ECHOCARDIOGRAM COMPLETE
Height: 65 in
WEIGHTICAEL: 3132.3 [oz_av]

## 2017-08-30 LAB — BASIC METABOLIC PANEL
Anion gap: 11 (ref 5–15)
BUN: 12 mg/dL (ref 6–20)
CALCIUM: 9.7 mg/dL (ref 8.9–10.3)
CHLORIDE: 104 mmol/L (ref 101–111)
CO2: 30 mmol/L (ref 22–32)
CREATININE: 0.9 mg/dL (ref 0.44–1.00)
GFR calc Af Amer: >60 mL/min (ref >60)
GFR calc non Af Amer: 55 mL/min — ABNORMAL LOW (ref >60)
Glucose, Bld: 69 mg/dL (ref 65–99)
Potassium: 3.3 mmol/L — ABNORMAL LOW (ref 3.5–5.1)
Sodium: 145 mmol/L (ref 135–145)

## 2017-08-30 LAB — TROPONIN I: Troponin I: <0.03 ng/mL (ref <0.03)

## 2017-08-30 MED ORDER — POLYETHYLENE GLYCOL 3350 17 G PO PACK
17.0000 g | PACK | Freq: Every day | ORAL | Status: DC
  Administered 2017-08-30 – 2017-09-03 (×5): 17 g via ORAL
  Filled 2017-08-30 (×5): qty 1

## 2017-08-30 MED ORDER — FUROSEMIDE 10 MG/ML IJ SOLN
40.0000 mg | Freq: Two times a day (BID) | INTRAMUSCULAR | Status: DC
  Administered 2017-08-30 – 2017-08-31 (×3): 40 mg via INTRAVENOUS
  Filled 2017-08-30 (×3): qty 4

## 2017-08-30 MED ORDER — POTASSIUM CHLORIDE CRYS ER 20 MEQ PO TBCR: 40.0000 meq | EXTENDED_RELEASE_TABLET | Freq: Two times a day (BID) | ORAL | Status: DC

## 2017-08-30 NOTE — Progress Notes (Signed)
[facility] PROGRESS NOTE    Progress Note  Natalie Goodman  GNF:[id number] DOB: Nov 09, 1924 DOA: 08/29/2017 PCP: Etta Grandchild, MD     Brief Narrative:   Natalie Goodman is an 84 y.o. female past medical history of anemia coronary artery disease, chronic kidney disease stage II diabetes mellitus with a recent stenosis of a diabetic foot ulcer presents for an evaluation of increased weakness and fall at home that happened the day prior to admission, the patient has been complaining of lower extremity edema  Assessment/Plan:   Acute diastolic CHF (congestive heart failure) [facility]): Chest x-ray showing bilateral pleural effusion, lower extremity edema, and obese patient with a BNP between 204 100. Increase IV Lasix 40 mg IV twice daily, monitor strict I's and O's Daily weights. Recheck a basic metabolic panel in the morning.  Hypokalemia Likely due to diuresis, repeating orally check a basic metabolic panel in the morning.  Acute lower UTI Started empirically on Rocephin culture data is pending.  Hypernatremia Resolved with IV diuresis  Right lower extremity ulcer: Doubt diabetic foot will get wound care, does not appear to be infected. I have asked the patient to keep the legs elevated as much as possible.  DVT prophylaxis: Lovenox Family Communication:none Disposition Plan/Barrier to D/C: home in 2-3 days Code Status:     Code Status Orders  (From admission, onward)        Start     Ordered   08/29/17 2025  Full code  Continuous     08/29/17 2024    Code Status History    Date Active Date Inactive Code Status Order ID Comments User Context   This patient has a current code status but no historical code status.        IV Access:    Peripheral IV   Procedures and diagnostic studies:   Dg Chest 2 View  Result Date: 08/29/2017 CLINICAL DATA:  Difficulty walking EXAM: CHEST  2 VIEW COMPARISON:  08/19/2017 FINDINGS: Cardiac shadow is enlarged. Aortic  calcifications are again seen. The lungs are well aerated bilaterally. Small bilateral pleural effusions are noted. Chronic appearing changes in the left retrocardiac region are noted. IMPRESSION: Small bilateral pleural effusions. Electronically Signed   By: Alcide Clever M.D.   On: 08/29/2017 16:52     Medical Consultants:    None.  Anti-Infectives:   rocephin  Subjective:    Natalie Goodman relates her breathing is slightly better.  Objective:    Vitals:   08/29/17 1728 08/29/17 1922 08/29/17 2115 08/30/17 0508  BP: (!) 144/58 (!) 134/105 130/87 132/76  Pulse: 64 70 65 60  Resp: 12 20 20 20   Temp:   98.2 F (36.8 C) 98 F (36.7 C)  TempSrc:   Axillary Axillary  SpO2: 99% 99% 97% 98%  Weight:   89.3 kg (196 lb 12.8 oz) 88.8 kg (195 lb 12.3 oz)  Height:   5\' 5"  (1.651 m)     Intake/Output Summary (Last 24 hours) at 08/30/2017 0824 Last data filed at 08/30/2017 0506 Gross per 24 hour  Intake -  Output 2300 ml  Net -2300 ml   Filed Weights   08/29/17 1433 08/29/17 2115 08/30/17 0508  Weight: 93.9 kg (207 lb) 89.3 kg (196 lb 12.8 oz) 88.8 kg (195 lb 12.3 oz)    Exam: General exam: In no acute distress. Respiratory system: Good air movement and clear to auscultation. Cardiovascular system: S1 & S2 heard, RRR. No JVD. Gastrointestinal system: Abdomen  is nondistended, soft and nontender.  Central nervous system: Alert and oriented. No focal neurological deficits. Extremities: No pedal edema. Skin: She has a right lower extremity ulcer on the lateral aspect superiorly to the malleolus    Data Reviewed:    Labs: Basic Metabolic Panel: Recent Labs  Lab 08/29/17 1526 08/29/17 2104 08/30/17 0216  NA 146*  --  145  K 3.3*  --  3.3*  CL 105  --  104  CO2 26  --  30  GLUCOSE 65  --  69  BUN 14  --  12  CREATININE 0.94 0.87 0.90  CALCIUM 10.0  --  9.7   GFR Estimated Creatinine Clearance: 45.7 mL/min (by C-G formula based on SCr of 0.9 mg/dL). Liver Function  Tests: Recent Labs  Lab 08/29/17 1526  AST 48*  ALT 30  ALKPHOS 61  BILITOT 1.8*  PROT 6.8  ALBUMIN 3.1*   No results for input(s): LIPASE, AMYLASE in the last 168 hours. No results for input(s): AMMONIA in the last 168 hours. Coagulation profile No results for input(s): INR, PROTIME in the last 168 hours.  CBC: Recent Labs  Lab 08/29/17 1526 08/29/17 2104  WBC 6.6 8.0  HGB 11.6* 12.4  HCT 35.5* 36.9  MCV 93.4 92.5  PLT 139* 149*   Cardiac Enzymes: Recent Labs  Lab 08/29/17 1526 08/29/17 2104 08/30/17 0216  CKTOTAL 268*  --   --   TROPONINI  --  <0.03 <0.03   BNP (last 3 results) No results for input(s): PROBNP in the last 8760 hours. CBG: No results for input(s): GLUCAP in the last 168 hours. D-Dimer: No results for input(s): DDIMER in the last 72 hours. Hgb A1c: No results for input(s): HGBA1C in the last 72 hours. Lipid Profile: No results for input(s): CHOL, HDL, LDLCALC, TRIG, CHOLHDL, LDLDIRECT in the last 72 hours. Thyroid function studies: No results for input(s): TSH, T4TOTAL, T3FREE, THYROIDAB in the last 72 hours.  Invalid input(s): FREET3 Anemia work up: No results for input(s): VITAMINB12, FOLATE, FERRITIN, TIBC, IRON, RETICCTPCT in the last 72 hours. Sepsis Labs: Recent Labs  Lab 08/29/17 1526 08/29/17 2104  WBC 6.6 8.0   Microbiology No results found for this or any previous visit (from the past 240 hour(s)).   Medications:   . aspirin  325 mg Oral Daily  . feeding supplement (ENSURE ENLIVE)  237 mL Oral BID BM  . heparin  5,000 Units Subcutaneous Q8H  . irbesartan  37.5 mg Oral Daily  . potassium chloride  20 mEq Oral BID  . potassium chloride  40 mEq Oral BID  . spironolactone  25 mg Oral Daily   Continuous Infusions: . cefTRIAXone (ROCEPHIN)  IV        LOS: 1 day   Marinda ElkAbraham Feliz Ortiz  [facility] Pager [phone]  *Please refer to [email or web address], password [email or web address] to get updated schedule on who will round on this  patient, as hospitalists switch teams weekly. If 7PM-7AM, please contact night-coverage at [email or web address], password [email or web address] for any overnight needs.  08/30/2017, 8:24 AM

## 2017-08-30 NOTE — Clinical Social Work Note (Signed)
Clinical Social Work Assessment  Patient Details  Name: Natalie Goodman MRN: [id number] Date of Birth: Apr 21, 1925  Date of referral:  08/30/17               Reason for consult:  Facility Placement, Discharge Planning                Permission sought to share information with:  Case Manager Permission granted to share information::     Name::        Agency::     Relationship::     Contact Information:     Housing/Transportation Living arrangements for the past 2 months:  Single Family Home Source of Information:  Patient, Medical Team, Other (Comment Required)(EMS) Patient Interpreter Needed:  None Criminal Activity/Legal Involvement Pertinent to Current Situation/Hospitalization:  No - Comment as needed Significant Relationships:  Warehouse manager, Other Family Members, Siblings Lives with:  Self Do you feel safe going back to the place where you live?  No("but I like my home") Need for family participation in patient care:  Yes (Comment)(possibly, patient sometimes confused)  Care giving concerns:  Pt is coming from home. Pt lives at home alone. Pt fell last night and denies any neck and back pain. Pt denies pain from fall. Pt is alert and oritented  X4.  EMS had to break into her house in order to get to her because she was sitting on the floor. Pt is unable to walk well due to the swelling. Pt has home health care, but is not benefiting from it and is in need of being placed in a facility.   Patient during assessment was lethargic and confused. Reports she has a sister who is involved, but does not live here. She has been contacted and reports she will be up to see the patient this afternoon.   Social Worker assessment / plan:  LCSW completed consult and assessment. Met with patient at bedside and discussed role. Patient was pleasant, however struggled answering questions due to confusion and sleepiness.  Reports she fell at home and likes her home when asked plans for discharge. It  appears safest DC plan would be short term rehab due to fall living alone and current mental status. Will discuss with sister once she arrives and begin work up for SNF. Patient did not decline SNF, however this writer would like input from family for all the understand due to patient's current cognition.   Plan: Anticipate SNF at DC. Will need insurance auth due to [place].  Will discuss with sister and complete work up.  Employment status:  Retired Nurse, adult PT Recommendations:  [place] / Referral to community resources:  [employer]  Patient/Family's Response to care:  Patient is calm and cooperative. She is lacks ability to understand current treatment plans  Patient/Family's Understanding of and Emotional Response to Diagnosis, Current Treatment, and Prognosis:  Still assessing, will discuss with family.  Emotional Assessment Appearance:  Appears stated age Attitude/Demeanor/Rapport:    Affect (typically observed):  Anxious, Stable, Pleasant Orientation:  Oriented to Self, Oriented to Place Alcohol / Substance use:  Not Applicable Psych involvement (Current and /or in the community):  No (Comment)  Discharge Needs  Concerns to be addressed:  Adjustment to Illness, Home Safety Concerns, Discharge Planning Concerns Readmission within the last 30 days:  No Current discharge risk:  Lives alone Barriers to Discharge:  Continued Medical Work up, Solicitor, Evie Lacks, [place]  08/30/2017, 2:33 PM

## 2017-08-30 NOTE — NC FL2 (Addendum)
[place] MEDICAID FL2 LEVEL OF CARE SCREENING TOOL     IDENTIFICATION  Patient Name: Natalie Goodman Birthdate: Dec 05, 1924 Sex: female Admission Date (Current Location): 08/29/2017  [facility] and [place] Number:  Producer, television/film/video and Address:  [facility],  [address] [place], [place] [address]      Provider Number: [id number]  Attending Physician Name and Address:  Marinda Elk, MD  Relative Name and Phone Number:       Current Level of Care: Hospital Recommended Level of Care: Skilled Nursing Facility Prior Approval Number:    Date Approved/Denied:   PASRR Number:    Discharge Plan: SNF    Current Diagnoses: Patient Active Problem List   Diagnosis Date Noted  . Acute diastolic CHF (congestive heart failure) (HCC) 08/30/2017  . Acute lower UTI 08/30/2017  . Hypernatremia 08/30/2017  . Chronic systolic congestive heart failure (HCC) 08/26/2017  . TSH elevation 08/25/2017  . Hypokalemia 08/25/2017  . Primary osteoarthritis of both knees 10/21/2016  . Deficiency anemia 11/29/2015  . Routine general medical examination at a health care facility 03/27/2014  . Eczema, dyshidrotic 04/01/2013  . Obesity (BMI 35.0-39.9 without comorbidity) 03/10/2013  . ESOPHAGEAL STRICTURE 06/27/2010  . GERD 06/12/2010  . KIDNEY DISEASE, CHRONIC, STAGE I 03/15/2010  . Hyperlipidemia with target LDL less than 160 03/01/2010  . Essential hypertension 03/01/2010  . Coronary atherosclerosis 03/01/2010  . PARESTHESIA 03/01/2010    Orientation RESPIRATION BLADDER Height & Weight     Self, Place  Normal Continent Weight: 195 lb 12.3 oz (88.8 kg) Height:  5\' 5"  (165.1 cm)  BEHAVIORAL SYMPTOMS/MOOD NEUROLOGICAL BOWEL NUTRITION STATUS      Continent Diet(See DC summary)  AMBULATORY STATUS COMMUNICATION OF NEEDS Skin   Extensive Assist Verbally Skin abrasions, Other (Comment)(blister on leg)                       Personal Care Assistance Level of Assistance   Bathing, Feeding, Dressing Bathing Assistance: Maximum assistance Feeding assistance: Limited assistance Dressing Assistance: Maximum assistance     Functional Limitations Info  Sight, Hearing, Speech Sight Info: Adequate Hearing Info: Adequate Speech Info: Adequate    SPECIAL CARE FACTORS FREQUENCY  PT (By licensed PT), OT (By licensed OT)     PT Frequency: 5x OT Frequency: 5x            Contractures Contractures Info: Not present    Additional Factors Info  Code Status, Allergies Code Status Info: Full Code Allergies Info: Codeine           Current Medications (08/30/2017):  This is the current hospital active medication list Current Facility-Administered Medications  Medication Dose Route Frequency Provider Last Rate Last Dose  . acetaminophen (TYLENOL) tablet 650 mg  650 mg Oral Q6H PRN Carron Curie, MD       Or  . acetaminophen (TYLENOL) suppository 650 mg  650 mg Rectal Q6H PRN Carron Curie, MD      . aspirin tablet 325 mg  325 mg Oral Daily Carron Curie, MD   325 mg at 08/30/17 0942  . cefTRIAXone (ROCEPHIN) 1 g in sodium chloride 0.9 % 100 mL IVPB  1 g Intravenous Q24H Carron Curie, MD      . feeding supplement (ENSURE ENLIVE) (ENSURE ENLIVE) liquid 237 mL  237 mL Oral BID BM Carron Curie, MD   237 mL at 08/30/17 1046  . furosemide (LASIX) injection 40 mg  40 mg Intravenous Q12H David Stall,  Darin Engels, MD   40 mg at 08/30/17 0916  . heparin injection 5,000 Units  5,000 Units Subcutaneous Q8H Carron Curie, MD   5,000 Units at 08/30/17 1346  . irbesartan (AVAPRO) tablet 37.5 mg  37.5 mg Oral Daily Carron Curie, MD   37.5 mg at 08/30/17 0942  . ondansetron (ZOFRAN) tablet 4 mg  4 mg Oral Q6H PRN Carron Curie, MD       Or  . ondansetron (ZOFRAN) injection 4 mg  4 mg Intravenous Q6H PRN Carron Curie, MD      . polyethylene glycol (MIRALAX / GLYCOLAX) packet 17 g  17 g Oral Daily Marinda Elk, MD   17 g at 08/30/17 0916  . potassium chloride SA (K-DUR,KLOR-CON) CR  tablet 20 mEq  20 mEq Oral BID Carron Curie, MD   20 mEq at 08/30/17 0943  . spironolactone (ALDACTONE) tablet 25 mg  25 mg Oral Daily Carron Curie, MD   25 mg at 08/30/17 0942  . zolpidem (AMBIEN) tablet 5 mg  5 mg Oral QHS PRN Carron Curie, MD         Discharge Medications: Please see discharge summary for a list of discharge medications.  Relevant Imaging Results:  Relevant Lab Results:   Additional Information SSN [id number]  Raye Sorrow, [place]

## 2017-08-30 NOTE — Evaluation (Addendum)
Occupational Therapy Evaluation Patient Details Name: Natalie Goodman MRN: [id number] DOB: [birth year] Today's Date: 08/30/2017    History of Present Illness Natalie Goodman is an 84 y.o. female past medical history of anemia coronary artery disease, chronic kidney disease stage II diabetes mellitus with a recent stenosis of a diabetic foot ulcer presents for an evaluation of increased weakness and fall at home that happened the day prior to admission, the patient has been complaining of lower extremity edema   Clinical Impression   Pt was inconsistent with following commands at times and required increased time and cues to complete tasks. She was also impulsive with activity at times. Feel pt will need 24/7 at SNF for d/c for safety. Pt will benefit from OT to progress ADL independence and safety. Will follow.    Follow Up Recommendations  SNF;Supervision/Assistance - 24 hour    Equipment Recommendations  Other (comment)(defer next venue)    Recommendations for Other Services       Precautions / Restrictions Precautions Precautions: Fall Restrictions Weight Bearing Restrictions: No      Mobility Bed Mobility Overal bed mobility: Needs Assistance Bed Mobility: Supine to Sit;Sit to Supine     Supine to sit: Min assist;+2 for physical assistance Sit to supine: Min assist;+2 for physical assistance   General bed mobility comments: Pt needed MIN A of 2 for managing her edematous LE.  She was able to use UE to re-position and gave forth good effort.  Transfers Overall transfer level: Needs assistance Equipment used: Rolling walker (2 wheeled) Transfers: Sit to/from Stand Sit to Stand: Min assist;+2 physical assistance         General transfer comment: Multi-modal cueing for hand placement with difficulty following instructions.  Impulsive at times.    Balance Overall balance assessment: Needs assistance;History of Falls   Sitting balance-Leahy Scale: Good       Standing  balance-Leahy Scale: Poor                             ADL either performed or assessed with clinical judgement   ADL Overall ADL's : Needs assistance/impaired Eating/Feeding: Set up;Sitting   Grooming: Wash/dry hands;Sitting;Set up   Upper Body Bathing: Minimal assistance;Sitting   Lower Body Bathing: +2 for physical assistance;Maximal assistance;Sit to/from stand;+2 for safety/equipment   Upper Body Dressing : Moderate assistance;Sitting   Lower Body Dressing: +2 for physical assistance;Sit to/from stand;+2 for safety/equipment;Total assistance   Toilet Transfer: +2 for physical assistance;+2 for safety/equipment;Minimal assistance;RW;Stand-pivot;BSC   Toileting- Clothing Manipulation and Hygiene: +2 for physical assistance;+2 for safety/equipment;Moderate assistance;Sit to/from stand         General ADL Comments: Pt gave inconsisent information regarding home living PTA including whether or not she had a [facility] nurse which per chart she does. She displayed difficulty following commands at times and required increased time and cues to follow at times. She was also impulsive with activity at times. Socks removed at end of session due to edema in LEs and socks fitting tightly.     Vision Patient Visual Report: No change from baseline       Perception     Praxis      Pertinent Vitals/Pain Pain Assessment: Faces Faces Pain Scale: Hurts a little bit Pain Location: legs with movement Pain Descriptors / Indicators: Discomfort Pain Intervention(s): Limited activity within patient's tolerance;Monitored during session     Hand Dominance     Extremity/Trunk Assessment Upper Extremity Assessment  Upper Extremity Assessment: Generalized weakness          Communication Communication Communication: No difficulties   Cognition Arousal/Alertness: Awake/alert Behavior During Therapy: WFL for tasks assessed/performed Overall Cognitive Status: No family/caregiver  present to determine baseline cognitive functioning                                 General Comments: Pt gave inconsistent answers to her prior level of function. Difficulty following commands at times.   General Comments  Pt returned to bed due to ECHO needing her in bed to perform test.    Exercises     Shoulder Instructions      Home Living Family/patient expects to be discharged to:: Skilled nursing facility Living Arrangements: Alone Available Help at Discharge: Available PRN/intermittently                             Additional Comments: Pt had nurse checking on her per chart, but pt gave inconsistent answers on whether she had a nurse or not.      Prior Functioning/Environment Level of Independence: Independent        Comments: Difficult to get clear picture of prior function.        OT Problem List: Decreased strength;Decreased knowledge of use of DME or AE;Decreased safety awareness      OT Treatment/Interventions: Self-care/ADL training;DME and/or AE instruction;Therapeutic activities;Patient/family education    OT Goals(Current goals can be found in the care plan section) Acute Rehab OT Goals Patient Stated Goal: be able to move better OT Goal Formulation: With patient Time For Goal Achievement: 09/13/17 Potential to Achieve Goals: Good  OT Frequency: Min 2X/week   Barriers to D/C:            Co-evaluation PT/OT/SLP Co-Evaluation/Treatment: Yes Reason for Co-Treatment: For patient/therapist safety PT goals addressed during session: Mobility/safety with mobility;Balance;Proper use of DME OT goals addressed during session: ADL's and self-care;Proper use of Adaptive equipment and DME      AM-PAC PT "6 Clicks" Daily Activity     Outcome Measure Help from another person eating meals?: A Little Help from another person taking care of personal grooming?: A Little Help from another person toileting, which includes using toliet,  bedpan, or urinal?: A Lot Help from another person bathing (including washing, rinsing, drying)?: A Lot Help from another person to put on and taking off regular upper body clothing?: A Little Help from another person to put on and taking off regular lower body clothing?: A Lot 6 Click Score: 15   End of Session Equipment Utilized During Treatment: Gait belt  Activity Tolerance: Patient tolerated treatment well Patient left: in bed;with call bell/phone within reach  OT Visit Diagnosis: Unsteadiness on feet (R26.81)                Time: 1610-9604 OT Time Calculation (min): 29 min Charges:  OT General Charges $OT Visit: 1 Visit OT Evaluation $OT Eval Moderate Complexity: 1 Mod G-Codes:       Nikita Surman S Earleen Aoun 08/30/2017, 10:20 AM

## 2017-08-30 NOTE — Plan of Care (Signed)
Patient without complaint this shift, VSS.  R leg with open area with weeping, vaseline gauze and [facility].  Patient diuresing with greater than 2500 mls urine output this shift.  Large amount of family in to visit patient this shift, sister spoke with social worker regarding placement options for rehab.

## 2017-08-30 NOTE — Progress Notes (Signed)
  Echocardiogram 2D Echocardiogram has been performed.  Natalie Goodman T Betta Balla 08/30/2017, 10:39 AM

## 2017-08-30 NOTE — Evaluation (Signed)
Physical Therapy Evaluation Patient Details Name: Natalie Goodman MRN: [id number] DOB: [birth year] Today's Date: 08/30/2017   History of Present Illness  Natalie Goodman is an 84 y.o. female past medical history of anemia coronary artery disease, chronic kidney disease stage II diabetes mellitus with a recent stenosis of a diabetic foot ulcer presents for an evaluation of increased weakness and fall at home that happened the day prior to admission, the patient has been complaining of lower extremity edema  Clinical Impression  Pt admitted with above diagnosis. Pt currently with functional limitations due to the deficits listed below (see PT Problem List).  Pt will benefit from skilled PT to increase their independence and safety with mobility to allow discharge to the venue listed below.  Pt moving with MIN A of 2, but demonstrates some decreased safety with occasional impulsivity at times.  Recommend short term SNF rehab after acute care.     Follow Up Recommendations SNF    Equipment Recommendations  None recommended by PT    Recommendations for Other Services       Precautions / Restrictions Precautions Precautions: Fall Restrictions Weight Bearing Restrictions: No      Mobility  Bed Mobility Overal bed mobility: Needs Assistance Bed Mobility: Supine to Sit;Sit to Supine     Supine to sit: Min assist;+2 for physical assistance Sit to supine: Min assist;+2 for physical assistance   General bed mobility comments: Pt needed MIN A of 2 for managing her edematous LE.  She was able to use UE to re-position and gave forth good effort.  Transfers Overall transfer level: Needs assistance Equipment used: Rolling walker (2 wheeled) Transfers: Sit to/from Stand Sit to Stand: Min assist;+2 physical assistance         General transfer comment: Multi-modal cueing for hand placement with difficulty following instructions.  Impulsive at times.  Ambulation/Gait Ambulation/Gait assistance:  Min assist;+2 physical assistance Ambulation Distance (Feet): 4 Feet Assistive device: Rolling walker (2 wheeled) Gait Pattern/deviations: Decreased step length - right;Decreased step length - left     General Gait Details: Pt ambulated short distance to [facility] and then back to bed.    Stairs            Wheelchair Mobility    Modified Rankin (Stroke Patients Only)       Balance Overall balance assessment: Needs assistance;History of Falls   Sitting balance-Leahy Scale: Good       Standing balance-Leahy Scale: Poor                               Pertinent Vitals/Pain Pain Assessment: Faces Faces Pain Scale: Hurts a little bit Pain Location: legs with movement Pain Descriptors / Indicators: Discomfort Pain Intervention(s): Limited activity within patient's tolerance;Monitored during session    Home Living Family/patient expects to be discharged to:: Skilled nursing facility Living Arrangements: Alone Available Help at Discharge: Available PRN/intermittently             Additional Comments: Pt had nurse checking on her per chart, but pt gave inconsistent answers on whether she had a nurse or not.    Prior Function Level of Independence: Independent         Comments: Difficult to get clear picture of prior function.     Hand Dominance        Extremity/Trunk Assessment   Upper Extremity Assessment Upper Extremity Assessment: Defer to OT evaluation    Lower Extremity Assessment  Lower Extremity Assessment: Generalized weakness;RLE deficits/detail;LLE deficits/detail RLE Deficits / Details: extremely edematous with draining wound present.  Currently wrapped at lower leg LLE Deficits / Details: extremely edematous, redness noted near ankle at sock line.  Sock removed after session       Communication   Communication: No difficulties  Cognition Arousal/Alertness: Awake/alert Behavior During Therapy: WFL for tasks  assessed/performed Overall Cognitive Status: No family/caregiver present to determine baseline cognitive functioning                                 General Comments: Pt gave inconsistent answers to her prior level of function      General Comments General comments (skin integrity, edema, etc.): Pt returned to bed due to ECHO needing her in bed to perform test.    Exercises     Assessment/Plan    PT Assessment Patient needs continued PT services  PT Problem List Decreased strength;Decreased balance;Decreased skin integrity;Decreased safety awareness;Decreased mobility       PT Treatment Interventions DME instruction;Functional mobility training;Gait training;Therapeutic activities;Balance training;Neuromuscular re-education;Patient/family education    PT Goals (Current goals can be found in the Care Plan section)  Acute Rehab PT Goals Patient Stated Goal: get rid of swelling in legs. PT Goal Formulation: With patient Time For Goal Achievement: 09/13/17 Potential to Achieve Goals: Good    Frequency Min 3X/week   Barriers to discharge Decreased caregiver support      Co-evaluation PT/OT/SLP Co-Evaluation/Treatment: Yes Reason for Co-Treatment: For patient/therapist safety PT goals addressed during session: Mobility/safety with mobility;Balance;Proper use of DME         AM-PAC PT "6 Clicks" Daily Activity  Outcome Measure Difficulty turning over in bed (including adjusting bedclothes, sheets and blankets)?: Unable Difficulty moving from lying on back to sitting on the side of the bed? : Unable Difficulty sitting down on and standing up from a chair with arms (e.g., wheelchair, bedside commode, etc,.)?: Unable Help needed moving to and from a bed to chair (including a wheelchair)?: A Little Help needed walking in hospital room?: A Little Help needed climbing 3-5 steps with a railing? : A Lot 6 Click Score: 11    End of Session Equipment Utilized During  Treatment: Gait belt Activity Tolerance: Patient tolerated treatment well Patient left: in bed;with nursing/sitter in room(nurse attending to pt) Nurse Communication: Mobility status PT Visit Diagnosis: Muscle weakness (generalized) (M62.81);Difficulty in walking, not elsewhere classified (R26.2)    Time: 8119-1478 PT Time Calculation (min) (ACUTE ONLY): 29 min   Charges:   PT Evaluation $PT Eval Moderate Complexity: 1 Mod     PT G Codes:        Delfino Friesen L. Katrinka Blazing, [place] Pager [phone] 08/30/2017   Enzo Montgomery 08/30/2017, 10:07 AM

## 2017-08-30 NOTE — Plan of Care (Signed)
  Pain Managment: General experience of comfort will improve 08/30/2017 0017 - Progressing by Antionette CharPeng, Georganne Siple P, RN

## 2017-08-30 NOTE — Progress Notes (Addendum)
LCSW followed up with family regarding DC plans.  Discussed with multiple family members at the bedside recommendations and plans for discharge pending agreement.  Family reports they are unable to stay with her, but could visit her in SNF rehab. Sister Wynona CanesChristine and other family member Manual MeierJoyce Fultz discussed with patient reasons for SNF and request [facility] or [facility] as choices for SNF.  LCSW also inquired about patient's SSN.  No copy on file and patient reported it by memory but was cautious between numbers.  Alona BeneJoyce reports she works at [employer] [place] and will follow up on Monday to confirm number thus NO PASSAR has been started since I am unclear if this is the right SSN.  LCSW did complete SNF work up and faxed out. In the event the number given to LCSW is wrong on FL2, we will update and submit to facility. Family aware and in agreement.  Need: Confirmed [employer] authorization Bed offers and Choice. Facility list given to family with LCSW contact info for weekend and weekday.  Call Joyce Monday:  [phone] or [phone]  And she will be able to look up SSN for placement/passar.   Deretha EmoryHannah Veron Senner LCSW, MSW Clinical Social Work: Optician, dispensingystem Wide Float Coverage for :  [phone]

## 2017-08-31 DIAGNOSIS — E870 Hyperosmolality and hypernatremia: Secondary | ICD-10-CM

## 2017-08-31 DIAGNOSIS — E876 Hypokalemia: Secondary | ICD-10-CM

## 2017-08-31 MED ORDER — FUROSEMIDE 10 MG/ML IJ SOLN
40.0000 mg | Freq: Three times a day (TID) | INTRAMUSCULAR | Status: DC
  Administered 2017-08-31 – 2017-09-01 (×2): 40 mg via INTRAVENOUS
  Filled 2017-08-31 (×2): qty 4

## 2017-08-31 MED ORDER — POTASSIUM CHLORIDE CRYS ER 20 MEQ PO TBCR
40.0000 meq | EXTENDED_RELEASE_TABLET | Freq: Two times a day (BID) | ORAL | Status: AC
  Administered 2017-08-31 (×2): 40 meq via ORAL
  Filled 2017-08-31 (×2): qty 2

## 2017-08-31 MED ORDER — PRO-STAT SUGAR FREE PO LIQD
30.0000 mL | Freq: Two times a day (BID) | ORAL | Status: DC
  Administered 2017-08-31 – 2017-09-03 (×7): 30 mL via ORAL
  Filled 2017-08-31 (×7): qty 30

## 2017-08-31 NOTE — Progress Notes (Signed)
[facility] PROGRESS NOTE    Progress Note  Natalie Goodman  BJY:[id number] DOB: [birth year] DOA: 08/29/2017 PCP: Etta Grandchild, MD     Brief Narrative:   Natalie Goodman is an 84 y.o. female past medical history of anemia coronary artery disease, chronic kidney disease stage II diabetes mellitus with a recent stenosis of a diabetic foot ulcer presents for an evaluation of increased weakness and fall at home that happened the day prior to admission, the patient has been complaining of lower extremity edema  Assessment/Plan:   Acute diastolic CHF (congestive heart failure) (HCC): Continue IV Lasix, daily standing weights, recheck him a basic metabolic panel and replete electrolytes as needed. She has had good urine output, continue strict I's and O's.  Hypokalemia Likely due to diuresis, replete more aggressively orally.  Check a basic metabolic panel in the morning.  Hypernatremia Resolved with IV diuresis  Right lower extremity ulcer: Doubt diabetic foot ulcer, looks more like a venous stasis ulcer.  Will get wound care, does not appear to be infected. Awaiting wound care consult.  DVT prophylaxis: Lovenox Family Communication:none Disposition Plan/Barrier to D/C: home in 2-3 days Code Status:     Code Status Orders  (From admission, onward)        Start     Ordered   08/29/17 2025  Full code  Continuous     08/29/17 2024    Code Status History    Date Active Date Inactive Code Status Order ID Comments User Context   This patient has a current code status but no historical code status.        IV Access:    Peripheral IV   Procedures and diagnostic studies:   Dg Chest 2 View  Result Date: 08/29/2017 CLINICAL DATA:  Difficulty walking EXAM: CHEST  2 VIEW COMPARISON:  08/19/2017 FINDINGS: Cardiac shadow is enlarged. Aortic calcifications are again seen. The lungs are well aerated bilaterally. Small bilateral pleural effusions are noted. Chronic appearing  changes in the left retrocardiac region are noted. IMPRESSION: Small bilateral pleural effusions. Electronically Signed   By: Alcide Clever M.D.   On: 08/29/2017 16:52     Medical Consultants:    None.  Anti-Infectives:   rocephin  Subjective:    Natalie Goodman relates her breathing is slightly better.  Objective:    Vitals:   08/30/17 0940 08/30/17 1349 08/30/17 2026 08/31/17 0351  BP: (!) 151/60 (!) 152/69 (!) 120/43 (!) 117/39  Pulse: 60 66 75 87  Resp:  18 17 17   Temp:  98.1 F (36.7 C) 97.6 F (36.4 C) 97.7 F (36.5 C)  TempSrc:  Oral Oral Oral  SpO2: 100% 100% 96% 97%  Weight:    85.6 kg (188 lb 11.4 oz)  Height:        Intake/Output Summary (Last 24 hours) at 08/31/2017 0807 Last data filed at 08/31/2017 0500 Gross per 24 hour  Intake 940 ml  Output 3100 ml  Net -2160 ml   Filed Weights   08/29/17 2115 08/30/17 0508 08/31/17 0351  Weight: 89.3 kg (196 lb 12.8 oz) 88.8 kg (195 lb 12.3 oz) 85.6 kg (188 lb 11.4 oz)    Exam: General exam: In no acute distress. Respiratory system: Good air movement and clear to auscultation. Cardiovascular system: S1 & S2 heard, RRR. No JVD. Gastrointestinal system: Abdomen is nondistended, soft and nontender.  Central nervous system: Alert and oriented. No focal neurological deficits. Extremities: No pedal edema. Skin: She has  a right lower extremity ulcer on the lateral aspect superiorly to the malleolus    Data Reviewed:    Labs: Basic Metabolic Panel: Recent Labs  Lab 08/29/17 1526 08/29/17 2104 08/30/17 0216  NA 146*  --  145  K 3.3*  --  3.3*  CL 105  --  104  CO2 26  --  30  GLUCOSE 65  --  69  BUN 14  --  12  CREATININE 0.94 0.87 0.90  CALCIUM 10.0  --  9.7   GFR Estimated Creatinine Clearance: 44.9 mL/min (by C-G formula based on SCr of 0.9 mg/dL). Liver Function Tests: Recent Labs  Lab 08/29/17 1526  AST 48*  ALT 30  ALKPHOS 61  BILITOT 1.8*  PROT 6.8  ALBUMIN 3.1*   No results for  input(s): LIPASE, AMYLASE in the last 168 hours. No results for input(s): AMMONIA in the last 168 hours. Coagulation profile No results for input(s): INR, PROTIME in the last 168 hours.  CBC: Recent Labs  Lab 08/29/17 1526 08/29/17 2104  WBC 6.6 8.0  HGB 11.6* 12.4  HCT 35.5* 36.9  MCV 93.4 92.5  PLT 139* 149*   Cardiac Enzymes: Recent Labs  Lab 08/29/17 1526 08/29/17 2104 08/30/17 0216 08/30/17 0822  CKTOTAL 268*  --   --   --   TROPONINI  --  <0.03 <0.03 <0.03   BNP (last 3 results) No results for input(s): PROBNP in the last 8760 hours. CBG: No results for input(s): GLUCAP in the last 168 hours. D-Dimer: No results for input(s): DDIMER in the last 72 hours. Hgb A1c: No results for input(s): HGBA1C in the last 72 hours. Lipid Profile: No results for input(s): CHOL, HDL, LDLCALC, TRIG, CHOLHDL, LDLDIRECT in the last 72 hours. Thyroid function studies: No results for input(s): TSH, T4TOTAL, T3FREE, THYROIDAB in the last 72 hours.  Invalid input(s): FREET3 Anemia work up: No results for input(s): VITAMINB12, FOLATE, FERRITIN, TIBC, IRON, RETICCTPCT in the last 72 hours. Sepsis Labs: Recent Labs  Lab 08/29/17 1526 08/29/17 2104  WBC 6.6 8.0   Microbiology No results found for this or any previous visit (from the past 240 hour(s)).   Medications:   . aspirin  325 mg Oral Daily  . feeding supplement (ENSURE ENLIVE)  237 mL Oral BID BM  . furosemide  40 mg Intravenous Q12H  . heparin  5,000 Units Subcutaneous Q8H  . irbesartan  37.5 mg Oral Daily  . polyethylene glycol  17 g Oral Daily  . potassium chloride  20 mEq Oral BID  . potassium chloride  40 mEq Oral BID  . spironolactone  25 mg Oral Daily   Continuous Infusions: . cefTRIAXone (ROCEPHIN)  IV Stopped (08/30/17 1815)      LOS: 2 days   Marinda ElkAbraham Feliz Ortiz  [facility] Pager [phone]  *Please refer to [email or web address], password [email or web address] to get updated schedule on who will round on this patient,  as hospitalists switch teams weekly. If 7PM-7AM, please contact night-coverage at [email or web address], password [email or web address] for any overnight needs.  08/31/2017, 8:07 AM

## 2017-08-31 NOTE — Plan of Care (Signed)
Patient continues to diurese this shift, VSS.  Multiple family members in to visit patient.  Patient has very poor appetite which appears to be longstanding upon talking to family.  Refuses Ensure but did take Pro Stat. Will eat ice cream and a few bites of solid foods such as mashed potatoes or oatmeal.  Patient states she is not hungry and that nothing tastes good.

## 2017-08-31 NOTE — Progress Notes (Signed)
Initial Nutrition Assessment  DOCUMENTATION CODES:   Obesity unspecified  INTERVENTION:   -Will provide Prostat liquid protein PO 30 ml BID with meals, each supplement provides 100 kcal, 15 grams protein. -D/c ensure per patient request  NUTRITION DIAGNOSIS:   Increased nutrient needs related to wound healing as evidenced by estimated needs.  GOAL:   Patient will meet greater than or equal to 90% of their needs  MONITOR:   PO intake, Supplement acceptance, Weight trends, Labs, I & O's, Skin  REASON FOR ASSESSMENT:   Malnutrition Screening Tool    ASSESSMENT:   84 y.o. female past medical history of anemia coronary artery disease, chronic kidney disease stage II diabetes mellitus with a recent stenosis of a diabetic foot ulcer   Pt currently consuming 25-50% of meals. Pt reports not liking Ensure supplements, will d/c. Will order Prostat for additional protein.  Per chart review, pt has lost 48 lb since May 2018. Pt with h/o CHF, weight loss may be associated with fluid loss as well. Pt weighed 196 lb at admission.  Medications: IV Lasix every 12 hours, Miralax packet daily, K-DUR tablet BID Labs reviewed: Low K   NUTRITION - FOCUSED PHYSICAL EXAM:  In respect to patient's comfort, did not perform NFPE.  Diet Order:  Diet Heart Room service appropriate? Yes; Fluid consistency: Thin; Fluid restriction: 1500 mL Fluid  EDUCATION NEEDS:   No education needs have been identified at this time  Skin:  Skin Assessment: Skin Integrity Issues: Skin Integrity Issues:: Diabetic Ulcer Diabetic Ulcer: RLE  Last BM:  PTA  Height:   Ht Readings from Last 1 Encounters:  08/29/17 5\' 5"  (1.651 m)    Weight:   Wt Readings from Last 1 Encounters:  08/31/17 188 lb 11.4 oz (85.6 kg)    Ideal Body Weight:  56.8 kg  BMI:  Body mass index is 31.4 kg/m.  Estimated Nutritional Needs:   Kcal:  1600-1800  Protein:  60-70g  Fluid:  1.5L/day  Tilda FrancoLindsey Ursula Dermody, MS, RD,  LDN [facility] Clinical Dietitian Pager: [phone] After Hours Pager: [phone]

## 2017-09-01 LAB — BASIC METABOLIC PANEL
Anion gap: 16 — ABNORMAL HIGH (ref 5–15)
BUN: 17 mg/dL (ref 6–20)
CHLORIDE: 96 mmol/L — AB (ref 101–111)
CO2: 34 mmol/L — ABNORMAL HIGH (ref 22–32)
CREATININE: 1.35 mg/dL — AB (ref 0.44–1.00)
Calcium: 10.4 mg/dL — ABNORMAL HIGH (ref 8.9–10.3)
GFR calc Af Amer: 39 mL/min — ABNORMAL LOW (ref >60)
GFR calc non Af Amer: 33 mL/min — ABNORMAL LOW (ref >60)
Glucose, Bld: 103 mg/dL — ABNORMAL HIGH (ref 65–99)
POTASSIUM: 4.3 mmol/L (ref 3.5–5.1)
SODIUM: 146 mmol/L — AB (ref 135–145)

## 2017-09-01 MED ORDER — SODIUM CHLORIDE 0.9 % IV BOLUS (SEPSIS)
250.0000 mL | Freq: Once | INTRAVENOUS | Status: AC
  Administered 2017-09-01: 250 mL via INTRAVENOUS

## 2017-09-01 MED ORDER — HYDROCERIN EX CREA
TOPICAL_CREAM | Freq: Two times a day (BID) | CUTANEOUS | Status: DC
  Administered 2017-09-01 – 2017-09-03 (×5): via TOPICAL
  Filled 2017-09-01: qty 113

## 2017-09-01 NOTE — Consult Note (Signed)
WOC Nurse wound consult note Reason for Consult:LLE ruptured blister. Bilateral heels dry, cracked. MASD in skin folds and to buttocks without break in skin. Wound type:venous insufficiency, moisture  Pressure Injury POA: NA Measurement: 5cm x 4cm x 0.1cm Wound bed:red, moist Drainage (amount, consistency, odor) serous Periwound: erythematous, warm Dressing procedure/placement/frequency: Patient's LEs are edematous, but patient and family are amazed and "how much they have gone down" since admission. Ruptured blister noted and topical care guidance provided with antimicrobial and astringent dressing.  Pressure redistribution heel boots are provided. A mattress replacement is provided for pressure redistribution, but turning and repositioning remain the cornerstone of her skin care plan.  WOC nursing team will not follow, but will remain available to this patient, the nursing and medical teams.  Please re-consult if needed. Thanks, Ladona MowLaurie Celvin Taney, MSN, RN, GNP, Hans EdenCWOCN, CWON-AP, FAAN  Pager# [phone]

## 2017-09-01 NOTE — Care Management Important Message (Signed)
Important Message  Patient Details  Name: Natalie Goodman MRN: [id number] Date of Birth: September 24, 1924   Medicare Important Message Given:  Yes    Caren MacadamFuller, Zandria Woldt 09/01/2017, 11:13 AMImportant Message  Patient Details  Name: Natalie Goodman MRN: [id number] Date of Birth: September 24, 1924   Medicare Important Message Given:  Yes    Caren MacadamFuller, Cailan Antonucci 09/01/2017, 11:13 AM

## 2017-09-01 NOTE — Progress Notes (Signed)
Physical Therapy Treatment Patient Details Name: Natalie Goodman MRN: [id number] DOB: Jun 01, 1925 Today's Date: 09/01/2017    History of Present Illness 84 y.o. female past medical history of anemia coronary artery disease, chronic kidney disease stage II diabetes mellitus with a recent stenosis of a diabetic foot ulcer presents for an evaluation of increased weakness and fall at home that happened the day prior to admission, the patient has been complaining of lower extremity edema    PT Comments    Pt was agreeable to working with PT. She appeared to require increased assistance on today compared to last session. Sit to stand practice x 2. Pt had difficulty keeping feet within BOS to safely stand with one person (feet would slide out forward). Pt was also able to perform a lateral scoot towards [facility] with Min assist. Unable to ambulate on today due to risk for falls/safety reasons-+2 assist not available at time of session. Will continue to follow and progress activity as tolerated. Continue to recommend SNF.     Follow Up Recommendations  SNF     Equipment Recommendations  None recommended by PT    Recommendations for Other Services       Precautions / Restrictions Precautions Precautions: Fall Restrictions Weight Bearing Restrictions: No    Mobility  Bed Mobility Overal bed mobility: Needs Assistance Bed Mobility: Rolling;Supine to Sit;Sit to Supine Rolling: Min guard   Supine to sit: Mod assist Sit to supine: Mod assist   General bed mobility comments: Assist for trunk and LEs (mostly LEs). Utilized bedpad to aid with scooting, positioning. Increased time. Multimodal cues for technique.   Transfers Overall transfer level: Needs assistance Equipment used: Rolling walker (2 wheeled) Transfers: Sit to/from Stand;Lateral/Scoot Transfers Sit to Stand: Mod Assist         General transfer comment: Assist to rise, stabilize, control descent. Partial sit to stand x 2. Pt had  difficulty keeping feet within a safe BOS on today. High risk for falls with +1 assist. Lateral scoot towards HOB x 4 with Min assist and bedpad to aid with scooting.   Ambulation/Gait             General Gait Details: NT on today for safety. +2 assist not available.    Stairs            Wheelchair Mobility    Modified Rankin (Stroke Patients Only)       Balance                                            Cognition Arousal/Alertness: Awake/alert Behavior During Therapy: WFL for tasks assessed/performed Overall Cognitive Status: No family/caregiver present to determine baseline cognitive functioning Area of Impairment: Problem solving;Following commands;Memory                     Memory: Decreased short-term memory Following Commands: Follows one step commands with increased time     Problem Solving: Requires tactile cues;Requires verbal cues        Exercises      General Comments        Pertinent Vitals/Pain Pain Assessment: Faces Faces Pain Scale: Hurts little more Pain Location: legs with movement R>L Pain Descriptors / Indicators: Discomfort;Grimacing Pain Intervention(s): Limited activity within patient's tolerance;Repositioned    Home Living  Prior Function            PT Goals (current goals can now be found in the care plan section) Progress towards PT goals: Progressing toward goals    Frequency    Min 3X/week      PT Plan Current plan remains appropriate    Co-evaluation              AM-PAC PT "6 Clicks" Daily Activity  Outcome Measure  Difficulty turning over in bed (including adjusting bedclothes, sheets and blankets)?: Unable Difficulty moving from lying on back to sitting on the side of the bed? : Unable Difficulty sitting down on and standing up from a chair with arms (e.g., wheelchair, bedside commode, etc,.)?: Unable Help needed moving to and from a bed to  chair (including a wheelchair)?: A Lot Help needed walking in hospital room?: A Lot Help needed climbing 3-5 steps with a railing? : Total 6 Click Score: 8    End of Session   Activity Tolerance: Patient tolerated treatment well Patient left: in bed;with call bell/phone within reach;with bed alarm set   PT Visit Diagnosis: Muscle weakness (generalized) (M62.81);Difficulty in walking, not elsewhere classified (R26.2)     Time: 8119-1478 PT Time Calculation (min) (ACUTE ONLY): 19 min  Charges:  $Therapeutic Activity: 8-22 mins                    G Codes:          Rebeca Alert, MPT Pager: [phone]

## 2017-09-01 NOTE — Progress Notes (Signed)
CSW contacted by patient's family member Natalie Goodman [phone]) and provided with patient's SSN.  Patient's PASRR completed. [phone]  CSW provided bed offers to patient/patient's family member Natalie KraftJoyce, [facility] selected.   [facility] and asked staff to start insurance authorization, staff agreed.  CSW will continue to follow and assist with discharge planning.  Celso SickleKimberly Kynzley Dowson, [place] Clinical Social Worker [facility] Cell#: [phone]

## 2017-09-01 NOTE — Progress Notes (Signed)
[facility] PROGRESS NOTE    Progress Note  JAPLEEN TORNOW  ZOX:[id number] DOB: 1925-05-14 DOA: 08/29/2017 PCP: Etta Grandchild, MD     Brief Narrative:   KRYSTALL KRUCKENBERG is an 84 y.o. female past medical history of anemia coronary artery disease, chronic kidney disease stage II diabetes mellitus with a recent stenosis of a diabetic foot ulcer presents for an evaluation of increased weakness and fall at home that happened the day prior to admission, the patient has been complaining of lower extremity edema  Assessment/Plan:   Acute diastolic CHF (congestive heart failure) [facility]): Daily standing weights, On admission her weight was 93.9 kg today is 79.9. She has had good urine output, continue strict I's and O's. Her weight is trending down. She has no JVD on physical exam basic metabolic panel shows a mild rise in creatinine, we will KVO IV fluids recheck a basic metabolic panel liberalize her diet.  Hypokalemia Likely due to diuresis, resolved.  Hypernatremia Resolved with IV diuresis  Right lower extremity ulcer: Doubt diabetic foot ulcer, looks more like a venous stasis ulcer.  Will get wound care, does not appear to be infected. Awaiting wound care consult.  DVT prophylaxis: Lovenox Family Communication:none Disposition Plan/Barrier to D/C: home in 1 days Code Status:     Code Status Orders  (From admission, onward)        Start     Ordered   08/29/17 2025  Full code  Continuous     08/29/17 2024    Code Status History    Date Active Date Inactive Code Status Order ID Comments User Context   This patient has a current code status but no historical code status.        IV Access:    Peripheral IV   Procedures and diagnostic studies:   No results found.   Medical Consultants:    None.  Anti-Infectives:   rocephin  Subjective:    JALONDA ANTIGUA relates her breathing is slightly better.  Objective:    Vitals:   08/31/17 1238 08/31/17 1328  08/31/17 2046 09/01/17 0452  BP: (!) 133/58 (!) 150/79 (!) 145/52 (!) 127/50  Pulse: 85 (!) 58 92 95  Resp: 18 18 12 16   Temp: (!) 97.5 F (36.4 C) 98 F (36.7 C) 98 F (36.7 C) 98 F (36.7 C)  TempSrc: Oral Oral Oral Oral  SpO2: 100% 98% 99% 97%  Weight:    79.9 kg (176 lb 2.4 oz)  Height:        Intake/Output Summary (Last 24 hours) at 09/01/2017 0802 Last data filed at 09/01/2017 0450 Gross per 24 hour  Intake 360 ml  Output 3050 ml  Net -2690 ml   Filed Weights   08/30/17 0508 08/31/17 0351 09/01/17 0452  Weight: 88.8 kg (195 lb 12.3 oz) 85.6 kg (188 lb 11.4 oz) 79.9 kg (176 lb 2.4 oz)    Exam: General exam: In no acute distress. Respiratory system: Good air movement and clear to auscultation. Cardiovascular system: S1 & S2 heard, RRR. No JVD. Gastrointestinal system: Abdomen is nondistended, soft and nontender.  Central nervous system: Alert and oriented. No focal neurological deficits. Extremities: No pedal edema. Skin: She has a right lower extremity ulcer on the lateral aspect superiorly to the malleolus    Data Reviewed:    Labs: Basic Metabolic Panel: Recent Labs  Lab 08/29/17 1526 08/29/17 2104 08/30/17 0216  NA 146*  --  145  K 3.3*  --  3.3*  CL 105  --  104  CO2 26  --  30  GLUCOSE 65  --  69  BUN 14  --  12  CREATININE 0.94 0.87 0.90  CALCIUM 10.0  --  9.7   GFR Estimated Creatinine Clearance: 43.4 mL/min (by C-G formula based on SCr of 0.9 mg/dL). Liver Function Tests: Recent Labs  Lab 08/29/17 1526  AST 48*  ALT 30  ALKPHOS 61  BILITOT 1.8*  PROT 6.8  ALBUMIN 3.1*   No results for input(s): LIPASE, AMYLASE in the last 168 hours. No results for input(s): AMMONIA in the last 168 hours. Coagulation profile No results for input(s): INR, PROTIME in the last 168 hours.  CBC: Recent Labs  Lab 08/29/17 1526 08/29/17 2104  WBC 6.6 8.0  HGB 11.6* 12.4  HCT 35.5* 36.9  MCV 93.4 92.5  PLT 139* 149*   Cardiac Enzymes: Recent  Labs  Lab 08/29/17 1526 08/29/17 2104 08/30/17 0216 08/30/17 0822  CKTOTAL 268*  --   --   --   TROPONINI  --  <0.03 <0.03 <0.03   BNP (last 3 results) No results for input(s): PROBNP in the last 8760 hours. CBG: No results for input(s): GLUCAP in the last 168 hours. D-Dimer: No results for input(s): DDIMER in the last 72 hours. Hgb A1c: No results for input(s): HGBA1C in the last 72 hours. Lipid Profile: No results for input(s): CHOL, HDL, LDLCALC, TRIG, CHOLHDL, LDLDIRECT in the last 72 hours. Thyroid function studies: No results for input(s): TSH, T4TOTAL, T3FREE, THYROIDAB in the last 72 hours.  Invalid input(s): FREET3 Anemia work up: No results for input(s): VITAMINB12, FOLATE, FERRITIN, TIBC, IRON, RETICCTPCT in the last 72 hours. Sepsis Labs: Recent Labs  Lab 08/29/17 1526 08/29/17 2104  WBC 6.6 8.0   Microbiology No results found for this or any previous visit (from the past 240 hour(s)).   Medications:   . aspirin  325 mg Oral Daily  . feeding supplement (ENSURE ENLIVE)  237 mL Oral BID BM  . feeding supplement (PRO-STAT SUGAR FREE 64)  30 mL Oral BID  . furosemide  40 mg Intravenous Q8H  . heparin  5,000 Units Subcutaneous Q8H  . irbesartan  37.5 mg Oral Daily  . polyethylene glycol  17 g Oral Daily  . potassium chloride  20 mEq Oral BID  . spironolactone  25 mg Oral Daily   Continuous Infusions:     LOS: 3 days   Marinda ElkAbraham Feliz Ortiz  [facility] Pager [phone]  *Please refer to [email or web address], password [email or web address] to get updated schedule on who will round on this patient, as hospitalists switch teams weekly. If 7PM-7AM, please contact night-coverage at [email or web address], password [email or web address] for any overnight needs.  09/01/2017, 8:02 AM

## 2017-09-02 ENCOUNTER — Inpatient Hospital Stay (HOSPITAL_COMMUNITY): Payer: Medicare HMO

## 2017-09-02 DIAGNOSIS — N390 Urinary tract infection, site not specified: Secondary | ICD-10-CM

## 2017-09-02 DIAGNOSIS — M7989 Other specified soft tissue disorders: Secondary | ICD-10-CM

## 2017-09-02 LAB — BASIC METABOLIC PANEL
Anion gap: 14 (ref 5–15)
BUN: 21 mg/dL — AB (ref 6–20)
CALCIUM: 10.3 mg/dL (ref 8.9–10.3)
CO2: 32 mmol/L (ref 22–32)
Chloride: 98 mmol/L — ABNORMAL LOW (ref 101–111)
Creatinine, Ser: 1.09 mg/dL — ABNORMAL HIGH (ref 0.44–1.00)
GFR calc Af Amer: 50 mL/min — ABNORMAL LOW (ref >60)
GFR, EST NON AFRICAN AMERICAN: 43 mL/min — AB (ref >60)
Glucose, Bld: 90 mg/dL (ref 65–99)
Potassium: 4 mmol/L (ref 3.5–5.1)
Sodium: 144 mmol/L (ref 135–145)

## 2017-09-02 MED ORDER — FUROSEMIDE 20 MG PO TABS: 20.0000 mg | ORAL_TABLET | Freq: Every day | ORAL | 0 refills | Status: DC

## 2017-09-02 NOTE — Discharge Summary (Addendum)
Physician Discharge Summary  Natalie Goodman:[id number] DOB: 25-Apr-1925 DOA: 08/29/2017  PCP: Etta Grandchild, MD  Admit date: 08/29/2017 Discharge date: 09/03/17  Admitted From: home Disposition:  SNF  Recommendations for Outpatient Follow-up:  1. Follow up with PCP in 1-2 weeks 2. Please obtain BMP/CBC in one week, her estimated dry weight is 78.9  Home Health:no Equipment/Devices:none  Discharge Condition:stable CODE STATUS:FUll Diet recommendation: Heart Healthy  Brief/Interim Summary: 84 y.o. female past medical history of anemia coronary artery disease, chronic kidney disease stage II diabetes mellitus with a recent stenosis of a diabetic foot ulcer presents for an evaluation of increased weakness and fall at home that happened the day prior to admission, the patient has been complaining of lower extremity edema   Discharge Diagnoses:  Active Problems:   Hypokalemia   Acute diastolic CHF (congestive heart failure) (HCC)   Acute lower UTI   Hypernatremia  Acute diastolic heart failure: On admission his weight was 93.9 kg, she was started on aggressive IV fluid hydration she diuresed well. She will be sent home on Aldactone and Lasix 20 mg daily along with an ACE inhibitor. We will try to restrict her fluids and continue daily weights. Her PCP we will check a basic metabolic panel in a week.  Hypokalemia: Likely due to diuresis now resolved.  Hyponatremia: Resolved with diuresis.  Right lower extremity ulcer: Wound care was consulted who recommended topical care guidance with antimicrobial and astringent dressing which she will continue at facility.  Discharge Instructions  Discharge Instructions    Diet - low sodium heart healthy   Complete by:  As directed    Increase activity slowly   Complete by:  As directed      Allergies as of 09/02/2017      Reactions   Codeine Nausea And Vomiting      Medication List    TAKE these medications   aspirin 325 MG  tablet Take 325 mg by mouth daily.   furosemide 20 MG tablet Commonly known as:  LASIX Take 1 tablet (20 mg total) by mouth daily.   spironolactone 25 MG tablet Commonly known as:  ALDACTONE Take 1 tablet (25 mg total) by mouth daily.   valsartan 160 MG tablet Commonly known as:  DIOVAN Take 160 mg by mouth daily.      Contact information for after-discharge care    Destination    [facility] .   Service:  Skilled Nursing Contact information: [address] [place] [address] [phone]             Allergies  Allergen Reactions  . Codeine Nausea And Vomiting    Consultations:  None   Procedures/Studies: Dg Chest 2 View  Result Date: 08/29/2017 CLINICAL DATA:  Difficulty walking EXAM: CHEST  2 VIEW COMPARISON:  08/19/2017 FINDINGS: Cardiac shadow is enlarged. Aortic calcifications are again seen. The lungs are well aerated bilaterally. Small bilateral pleural effusions are noted. Chronic appearing changes in the left retrocardiac region are noted. IMPRESSION: Small bilateral pleural effusions. Electronically Signed   By: Alcide Clever M.D.   On: 08/29/2017 16:52   Dg Chest 2 View  Result Date: 08/19/2017 CLINICAL DATA:  Pt c/o leg swelling and SOB x 1 week. Hx of CAD, HTN, AND MI. Pt is a nonsmoker. EXAM: CHEST  2 VIEW COMPARISON:  Chest CT, 11/08/2014.  Chest radiographs, 10/29/2013. FINDINGS: Cardiopericardial silhouette is mildly enlarged. No mediastinal or hilar masses. No convincing adenopathy. There  are prominent bronchovascular markings. This is most evident in the left lower lobe at the left lung base. This is similar to the prior chest CT, consistent with chronic bronchitic change. There is no convincing pneumonia and no evidence of pulmonary edema. No pleural effusion or pneumothorax. Skeletal structures are demineralized but grossly intact. IMPRESSION: 1. No acute cardiopulmonary disease. 2. Mild cardiomegaly.  Electronically Signed   By: Amie Portlandavid  Ormond M.D.   On: 08/19/2017 18:12      Subjective: No complaints feels great.  Discharge Exam: Vitals:   09/01/17 2013 09/02/17 0424  BP: (!) 134/42 (!) 123/48  Pulse: 73 73  Resp: 18 17  Temp: 97.8 F (36.6 C) 97.8 F (36.6 C)  SpO2: 98% 98%   Vitals:   09/01/17 0452 09/01/17 1200 09/01/17 2013 09/02/17 0424  BP: (!) 127/50 (!) 143/60 (!) 134/42 (!) 123/48  Pulse: 95 85 73 73  Resp: 16  18 17   Temp: 98 F (36.7 C) 97.7 F (36.5 C) 97.8 F (36.6 C) 97.8 F (36.6 C)  TempSrc: Oral Oral Axillary Axillary  SpO2: 97% 100% 98% 98%  Weight: 79.9 kg (176 lb 2.4 oz)   78.9 kg (173 lb 15.1 oz)  Height:        General: Pt is alert, awake, not in acute distress Cardiovascular: RRR, S1/S2 +, no rubs, no gallops Respiratory: CTA bilaterally, no wheezing, no rhonchi Abdominal: Soft, NT, ND, bowel sounds + Extremities: no edema, no cyanosis    The results of significant diagnostics from this hospitalization (including imaging, microbiology, ancillary and laboratory) are listed below for reference.     Microbiology: No results found for this or any previous visit (from the past 240 hour(s)).   Labs: BNP (last 3 results) Recent Labs    08/29/17 1526  BNP 287.8*   Basic Metabolic Panel: Recent Labs  Lab 08/29/17 1526 08/29/17 2104 08/30/17 0216 09/01/17 0813 09/02/17 0417  NA 146*  --  145 146* 144  K 3.3*  --  3.3* 4.3 4.0  CL 105  --  104 96* 98*  CO2 26  --  30 34* 32  GLUCOSE 65  --  69 103* 90  BUN 14  --  12 17 21*  CREATININE 0.94 0.87 0.90 1.35* 1.09*  CALCIUM 10.0  --  9.7 10.4* 10.3   Liver Function Tests: Recent Labs  Lab 08/29/17 1526  AST 48*  ALT 30  ALKPHOS 61  BILITOT 1.8*  PROT 6.8  ALBUMIN 3.1*   No results for input(s): LIPASE, AMYLASE in the last 168 hours. No results for input(s): AMMONIA in the last 168 hours. CBC: Recent Labs  Lab 08/29/17 1526 08/29/17 2104  WBC 6.6 8.0  HGB 11.6*  12.4  HCT 35.5* 36.9  MCV 93.4 92.5  PLT 139* 149*   Cardiac Enzymes: Recent Labs  Lab 08/29/17 1526 08/29/17 2104 08/30/17 0216 08/30/17 0822  CKTOTAL 268*  --   --   --   TROPONINI  --  <0.03 <0.03 <0.03   BNP: Invalid input(s): POCBNP CBG: No results for input(s): GLUCAP in the last 168 hours. D-Dimer No results for input(s): DDIMER in the last 72 hours. Hgb A1c No results for input(s): HGBA1C in the last 72 hours. Lipid Profile No results for input(s): CHOL, HDL, LDLCALC, TRIG, CHOLHDL, LDLDIRECT in the last 72 hours. Thyroid function studies No results for input(s): TSH, T4TOTAL, T3FREE, THYROIDAB in the last 72 hours.  Invalid input(s): FREET3 Anemia work up No results for  input(s): VITAMINB12, FOLATE, FERRITIN, TIBC, IRON, RETICCTPCT in the last 72 hours. Urinalysis    Component Value Date/Time   COLORURINE YELLOW 08/29/2017 1617   APPEARANCEUR CLEAR 08/29/2017 1617   LABSPEC 1.014 08/29/2017 1617   PHURINE 5.0 08/29/2017 1617   GLUCOSEU NEGATIVE 08/29/2017 1617   GLUCOSEU NEGATIVE 11/29/2015 1108   HGBUR SMALL (A) 08/29/2017 1617   BILIRUBINUR NEGATIVE 08/29/2017 1617   KETONESUR 20 (A) 08/29/2017 1617   PROTEINUR 30 (A) 08/29/2017 1617   UROBILINOGEN 0.2 11/29/2015 1108   NITRITE POSITIVE (A) 08/29/2017 1617   LEUKOCYTESUR NEGATIVE 08/29/2017 1617   Sepsis Labs Invalid input(s): PROCALCITONIN,  WBC,  LACTICIDVEN Microbiology No results found for this or any previous visit (from the past 240 hour(s)).   Time coordinating discharge: Over 30 minutes  SIGNED:   Marinda Elk, MD  [facility] 09/02/2017, 8:37 AM Pager   If 7PM-7AM, please contact night-coverage [email or web address] Password [email or web address]

## 2017-09-02 NOTE — Progress Notes (Signed)
CSW following to assist with dc [facility].   Barriers to discharge: Patient's [employer] insurance authorization still pending. Patient cannot private pay for SNF. SNF does not take LOG. Patient not safe to dc home.   CSW updated patient's attending MD. CSW updated patient's RN.   CSW will continue to follow and assist with discharge to SNF.   Celso SickleKimberly Oceanna Arruda, [place] Clinical Social Worker [facility] Cell#: [phone]

## 2017-09-02 NOTE — Progress Notes (Signed)
Medications administered by student RN [id number] with supervision of Clinical Instructor Shaneya Taketa MSN, RN-BC or patient's assigned RN.   

## 2017-09-02 NOTE — Progress Notes (Signed)
*  Preliminary Results* Bilateral lower extremity venous duplex completed. Bilateral lower extremities are negative for deep vein thrombosis. There is no evidence of Baker's cyst bilaterally.  09/02/2017 2:56 PM Gertie FeyMichelle Ronen Bromwell, BS, RVT, RDCS, RDMS

## 2017-09-03 DIAGNOSIS — I5032 Chronic diastolic (congestive) heart failure: Secondary | ICD-10-CM | POA: Diagnosis not present

## 2017-09-03 DIAGNOSIS — I10 Essential (primary) hypertension: Secondary | ICD-10-CM | POA: Diagnosis not present

## 2017-09-03 DIAGNOSIS — L89893 Pressure ulcer of other site, stage 3: Secondary | ICD-10-CM | POA: Diagnosis not present

## 2017-09-03 DIAGNOSIS — I252 Old myocardial infarction: Secondary | ICD-10-CM | POA: Diagnosis not present

## 2017-09-03 DIAGNOSIS — L89153 Pressure ulcer of sacral region, stage 3: Secondary | ICD-10-CM | POA: Diagnosis not present

## 2017-09-03 DIAGNOSIS — L89323 Pressure ulcer of left buttock, stage 3: Secondary | ICD-10-CM | POA: Diagnosis not present

## 2017-09-03 DIAGNOSIS — E669 Obesity, unspecified: Secondary | ICD-10-CM | POA: Diagnosis not present

## 2017-09-03 DIAGNOSIS — K5901 Slow transit constipation: Secondary | ICD-10-CM | POA: Diagnosis not present

## 2017-09-03 DIAGNOSIS — E870 Hyperosmolality and hypernatremia: Secondary | ICD-10-CM | POA: Diagnosis not present

## 2017-09-03 DIAGNOSIS — I129 Hypertensive chronic kidney disease with stage 1 through stage 4 chronic kidney disease, or unspecified chronic kidney disease: Secondary | ICD-10-CM | POA: Diagnosis not present

## 2017-09-03 DIAGNOSIS — E876 Hypokalemia: Secondary | ICD-10-CM | POA: Diagnosis not present

## 2017-09-03 DIAGNOSIS — Z515 Encounter for palliative care: Secondary | ICD-10-CM | POA: Diagnosis not present

## 2017-09-03 DIAGNOSIS — N183 Chronic kidney disease, stage 3 (moderate): Secondary | ICD-10-CM | POA: Diagnosis not present

## 2017-09-03 DIAGNOSIS — I83018 Varicose veins of right lower extremity with ulcer other part of lower leg: Secondary | ICD-10-CM | POA: Diagnosis not present

## 2017-09-03 DIAGNOSIS — R079 Chest pain, unspecified: Secondary | ICD-10-CM | POA: Diagnosis not present

## 2017-09-03 DIAGNOSIS — N390 Urinary tract infection, site not specified: Secondary | ICD-10-CM | POA: Diagnosis not present

## 2017-09-03 DIAGNOSIS — N181 Chronic kidney disease, stage 1: Secondary | ICD-10-CM | POA: Diagnosis not present

## 2017-09-03 DIAGNOSIS — D72829 Elevated white blood cell count, unspecified: Secondary | ICD-10-CM | POA: Diagnosis not present

## 2017-09-03 DIAGNOSIS — I5031 Acute diastolic (congestive) heart failure: Secondary | ICD-10-CM | POA: Diagnosis not present

## 2017-09-03 DIAGNOSIS — L8990 Pressure ulcer of unspecified site, unspecified stage: Secondary | ICD-10-CM | POA: Diagnosis not present

## 2017-09-03 DIAGNOSIS — E860 Dehydration: Secondary | ICD-10-CM | POA: Diagnosis present

## 2017-09-03 DIAGNOSIS — I5022 Chronic systolic (congestive) heart failure: Secondary | ICD-10-CM | POA: Diagnosis not present

## 2017-09-03 DIAGNOSIS — B370 Candidal stomatitis: Secondary | ICD-10-CM | POA: Diagnosis not present

## 2017-09-03 DIAGNOSIS — Z66 Do not resuscitate: Secondary | ICD-10-CM | POA: Diagnosis not present

## 2017-09-03 DIAGNOSIS — E1152 Type 2 diabetes mellitus with diabetic peripheral angiopathy with gangrene: Secondary | ICD-10-CM | POA: Diagnosis present

## 2017-09-03 DIAGNOSIS — R4182 Altered mental status, unspecified: Secondary | ICD-10-CM | POA: Diagnosis not present

## 2017-09-03 DIAGNOSIS — Z7189 Other specified counseling: Secondary | ICD-10-CM | POA: Diagnosis not present

## 2017-09-03 DIAGNOSIS — R6520 Severe sepsis without septic shock: Secondary | ICD-10-CM | POA: Diagnosis present

## 2017-09-03 DIAGNOSIS — R1312 Dysphagia, oropharyngeal phase: Secondary | ICD-10-CM | POA: Diagnosis not present

## 2017-09-03 DIAGNOSIS — E871 Hypo-osmolality and hyponatremia: Secondary | ICD-10-CM | POA: Diagnosis not present

## 2017-09-03 DIAGNOSIS — L97818 Non-pressure chronic ulcer of other part of right lower leg with other specified severity: Secondary | ICD-10-CM | POA: Diagnosis present

## 2017-09-03 DIAGNOSIS — E1122 Type 2 diabetes mellitus with diabetic chronic kidney disease: Secondary | ICD-10-CM | POA: Diagnosis not present

## 2017-09-03 DIAGNOSIS — D509 Iron deficiency anemia, unspecified: Secondary | ICD-10-CM | POA: Diagnosis not present

## 2017-09-03 DIAGNOSIS — Z9181 History of falling: Secondary | ICD-10-CM | POA: Diagnosis not present

## 2017-09-03 DIAGNOSIS — N179 Acute kidney failure, unspecified: Secondary | ICD-10-CM | POA: Diagnosis not present

## 2017-09-03 DIAGNOSIS — I2510 Atherosclerotic heart disease of native coronary artery without angina pectoris: Secondary | ICD-10-CM | POA: Diagnosis not present

## 2017-09-03 DIAGNOSIS — R238 Other skin changes: Secondary | ICD-10-CM | POA: Diagnosis not present

## 2017-09-03 DIAGNOSIS — L89322 Pressure ulcer of left buttock, stage 2: Secondary | ICD-10-CM | POA: Diagnosis not present

## 2017-09-03 DIAGNOSIS — A419 Sepsis, unspecified organism: Secondary | ICD-10-CM | POA: Diagnosis not present

## 2017-09-03 DIAGNOSIS — M6281 Muscle weakness (generalized): Secondary | ICD-10-CM | POA: Diagnosis not present

## 2017-09-03 DIAGNOSIS — I959 Hypotension, unspecified: Secondary | ICD-10-CM | POA: Diagnosis not present

## 2017-09-03 DIAGNOSIS — R0902 Hypoxemia: Secondary | ICD-10-CM | POA: Diagnosis not present

## 2017-09-03 DIAGNOSIS — L89313 Pressure ulcer of right buttock, stage 3: Secondary | ICD-10-CM | POA: Diagnosis not present

## 2017-09-03 DIAGNOSIS — R531 Weakness: Secondary | ICD-10-CM | POA: Diagnosis not present

## 2017-09-03 DIAGNOSIS — G9341 Metabolic encephalopathy: Secondary | ICD-10-CM | POA: Diagnosis not present

## 2017-09-03 DIAGNOSIS — E43 Unspecified severe protein-calorie malnutrition: Secondary | ICD-10-CM | POA: Diagnosis not present

## 2017-09-03 DIAGNOSIS — R627 Adult failure to thrive: Secondary | ICD-10-CM | POA: Diagnosis not present

## 2017-09-03 DIAGNOSIS — Z6828 Body mass index (BMI) 28.0-28.9, adult: Secondary | ICD-10-CM | POA: Diagnosis not present

## 2017-09-03 DIAGNOSIS — R262 Difficulty in walking, not elsewhere classified: Secondary | ICD-10-CM | POA: Diagnosis not present

## 2017-09-03 DIAGNOSIS — F0390 Unspecified dementia without behavioral disturbance: Secondary | ICD-10-CM | POA: Diagnosis present

## 2017-09-03 DIAGNOSIS — R488 Other symbolic dysfunctions: Secondary | ICD-10-CM | POA: Diagnosis not present

## 2017-09-03 DIAGNOSIS — I130 Hypertensive heart and chronic kidney disease with heart failure and stage 1 through stage 4 chronic kidney disease, or unspecified chronic kidney disease: Secondary | ICD-10-CM | POA: Diagnosis not present

## 2017-09-03 LAB — BASIC METABOLIC PANEL
ANION GAP: 13 (ref 5–15)
BUN: 37 mg/dL — ABNORMAL HIGH (ref 6–20)
CALCIUM: 10.2 mg/dL (ref 8.9–10.3)
CO2: 32 mmol/L (ref 22–32)
Chloride: 99 mmol/L — ABNORMAL LOW (ref 101–111)
Creatinine, Ser: 1.28 mg/dL — ABNORMAL HIGH (ref 0.44–1.00)
GFR, EST AFRICAN AMERICAN: 41 mL/min — AB (ref >60)
GFR, EST NON AFRICAN AMERICAN: 36 mL/min — AB (ref >60)
GLUCOSE: 87 mg/dL (ref 65–99)
Potassium: 4.6 mmol/L (ref 3.5–5.1)
SODIUM: 144 mmol/L (ref 135–145)

## 2017-09-03 NOTE — Clinical Social Work Placement (Signed)
Patient received and accepted bed offer [facility]. Facility aware of patient's discharge and confirmed bed offer. PTAR contacted, patient's family aware. Patient's RN can call report to [phone] Room 107, packet complete. CSW signing off, no other needs identified at this time.  CLINICAL SOCIAL WORK PLACEMENT  NOTE  Date:  09/03/2017  Patient Details  Name: Natalie Goodman MRN: [id number] Date of Birth: 1924/10/04  Clinical Social Work is seeking post-discharge placement for this patient at the Skilled  Nursing Facility level of care (*CSW will initial, date and re-position this form in  chart as items are completed):  Yes   Patient/family provided with [place] Clinical Social Work Department's list of facilities offering this level of care within the geographic area requested by the patient (or if unable, by the patient's family).  Yes   Patient/family informed of their freedom to choose among providers that offer the needed level of care, that participate in Medicare, Medicaid or managed care program needed by the patient, have an available bed and are willing to accept the patient.  Yes   Patient/family informed of 's ownership interest in [facility] and [facility], as well as of the fact that they are under no obligation to receive care at these facilities.  PASRR submitted to EDS on       PASRR number received on       Existing PASRR number confirmed on       FL2 transmitted to all facilities in geographic area requested by pt/family on 08/30/17     FL2 transmitted to all facilities within larger geographic area on       Patient informed that his/her managed care company has contracts with or will negotiate with certain facilities, including the following:        Yes   Patient/family informed of bed offers received.  [facility]     Physician recommends and patient chooses bed at      Patient [facility] 09[facility]03/17.  Patient to be transferred to facility by PTAR     Patient family notified on 09/03/17 of transfer.  Name of family member notified:  Manual MeierJoyce Fultz     PHYSICIAN       Additional Comment:    _______________________________________________ Antionette PolesKimberly L Yvana Samonte, LCSW 09/03/2017, 1:29 PM

## 2017-09-03 NOTE — Progress Notes (Signed)
Physical Therapy Treatment Patient Details Name: Natalie Goodman MRN: [id number] DOB: 05-12-1925 Today's Date: 09/03/2017    History of Present Illness 84 y.o. female past medical history of anemia coronary artery disease, chronic kidney disease stage II diabetes mellitus with a recent stenosis of a diabetic foot ulcer presents for an evaluation of increased weakness and fall at home that happened the day prior to admission, the patient has been complaining of lower extremity edema    PT Comments    Pt tolerated increased activity today. Mod assist for bed mobility. She ambulated 26' with RW and min/guard assist.    Follow Up Recommendations  SNF     Equipment Recommendations  None recommended by PT    Recommendations for Other Services       Precautions / Restrictions Precautions Precautions: Fall Restrictions Weight Bearing Restrictions: No    Mobility  Bed Mobility Overal bed mobility: Needs Assistance Bed Mobility: Supine to Sit     Supine to sit: Mod assist     General bed mobility comments: Assist for trunk and LEs (mostly LEs). Utilized bedpad to aid with scooting, positioning. Increased time. Multimodal cues for technique.   Transfers Overall transfer level: Needs assistance Equipment used: Rolling walker (2 wheeled) Transfers: Sit to/from Stand Sit to Stand: +2 safety/equipment;Min assist;From elevated surface         General transfer comment: assist to rise/stabilize. VCs hand placement  Ambulation/Gait Ambulation/Gait assistance: Min guard;+2 safety/equipment Ambulation Distance (Feet): 20 Feet Assistive device: Rolling walker (2 wheeled) Gait Pattern/deviations: Step-through pattern;Decreased stride length;Wide base of support   Gait velocity interpretation: Below normal speed for age/gender General Gait Details: steady with RW, no LOB, distance limited by fatigue   Stairs            Wheelchair Mobility    Modified Rankin (Stroke Patients  Only)       Balance Overall balance assessment: Needs assistance;History of Falls   Sitting balance-Leahy Scale: Good       Standing balance-Leahy Scale: Poor                              Cognition Arousal/Alertness: Awake/alert Behavior During Therapy: WFL for tasks assessed/performed Overall Cognitive Status: Within Functional Limits for tasks assessed                                        Exercises      General Comments        Pertinent Vitals/Pain Faces Pain Scale: Hurts little more Pain Location: legs with movement R>L Pain Descriptors / Indicators: Discomfort;Grimacing Pain Intervention(s): Limited activity within patient's tolerance;Monitored during session;Patient requesting pain meds-RN notified    Home Living                      Prior Function            PT Goals (current goals can now be found in the care plan section) Acute Rehab PT Goals Patient Stated Goal: be able to move better PT Goal Formulation: With patient Time For Goal Achievement: 09/13/17 Potential to Achieve Goals: Good Progress towards PT goals: Progressing toward goals    Frequency    Min 3X/week      PT Plan Current plan remains appropriate    Co-evaluation  AM-PAC PT "6 Clicks" Daily Activity  Outcome Measure  Difficulty turning over in bed (including adjusting bedclothes, sheets and blankets)?: A Lot Difficulty moving from lying on back to sitting on the side of the bed? : Unable Difficulty sitting down on and standing up from a chair with arms (e.g., wheelchair, bedside commode, etc,.)?: Unable Help needed moving to and from a bed to chair (including a wheelchair)?: A Little Help needed walking in hospital room?: A Little Help needed climbing 3-5 steps with a railing? : Total 6 Click Score: 11    End of Session Equipment Utilized During Treatment: Gait belt Activity Tolerance: Patient tolerated treatment  well Patient left: with call bell/phone within reach;in chair;with chair alarm set;with nursing/sitter in room   PT Visit Diagnosis: Muscle weakness (generalized) (M62.81);Difficulty in walking, not elsewhere classified (R26.2)     Time: 7425-9563 PT Time Calculation (min) (ACUTE ONLY): 24 min  Charges:  $Therapeutic Activity: 23-37 mins                    G Codes:          Tamala Ser 09/03/2017, 10:24 AM [phone]

## 2017-09-04 ENCOUNTER — Non-Acute Institutional Stay (SKILLED_NURSING_FACILITY): Payer: Medicare HMO | Admitting: Adult Health

## 2017-09-04 ENCOUNTER — Encounter: Payer: Self-pay | Admitting: Internal Medicine

## 2017-09-04 ENCOUNTER — Telehealth: Payer: Self-pay

## 2017-09-04 ENCOUNTER — Encounter: Payer: Self-pay | Admitting: Adult Health

## 2017-09-04 DIAGNOSIS — I5031 Acute diastolic (congestive) heart failure: Secondary | ICD-10-CM | POA: Diagnosis not present

## 2017-09-04 DIAGNOSIS — I2510 Atherosclerotic heart disease of native coronary artery without angina pectoris: Secondary | ICD-10-CM

## 2017-09-04 DIAGNOSIS — K5901 Slow transit constipation: Secondary | ICD-10-CM | POA: Diagnosis not present

## 2017-09-04 DIAGNOSIS — R531 Weakness: Secondary | ICD-10-CM | POA: Diagnosis not present

## 2017-09-04 DIAGNOSIS — E876 Hypokalemia: Secondary | ICD-10-CM | POA: Diagnosis not present

## 2017-09-04 NOTE — Progress Notes (Deleted)
Location:  [facility] Room Number: 107-A Place of Service:  SNF (31) Provider:  Kenard Gower, NP  Patient Care Team: Etta Grandchild, MD as PCP - General  Extended Emergency Contact Information Primary Emergency Contact: Phineas Semen of [place] Home Phone: [phone] Relation: Sister  Code Status:  Full Code  Goals of care: Advanced Directive information Advanced Directives 08/29/2017  Does Patient Have a Medical Advance Directive? No  Type of Advance Directive -  Does patient want to make changes to medical advance directive? -  Copy of Healthcare Power of Attorney in Chart? -  Would patient like information on creating a medical advance directive? No - Patient declined     Chief Complaint  Patient presents with  . Acute Visit    Hospital followup, status post hospitalization at [facility] 2/15-2/20/19 for increased weakness and fall    HPI:  Pt is a 84 y.o. female seen today for hospital followup.  She was admitted to [facility] on 09/03/17 for short-term rehabilitation following hospitalization at [facility] 2/15-2/20/19 for increased weakness and a fall.  She has a PMH of hypokalemia, acute diastolic CHF, CAD, GERD, stage I CKD, MI, and hypernatremia.     Past Medical History:  Diagnosis Date  . Anemia   . CAD (coronary artery disease)    a. per patient report, unable to recall any cardiac catheterizations being performed or stents placed  b. NST in 2013 showing inferior and lateral wall scar with mild ischemia --> treated medically  . ESOPHAGEAL STRICTURE   . GERD   . Hyperlipidemia   . Hypertension   . KIDNEY DISEASE, CHRONIC, STAGE I   . MYOCARDIAL INFARCTION, HX OF   . OSTEOARTHRITIS   . PARESTHESIA    Past Surgical History:  Procedure Laterality Date  . ABDOMINAL HYSTERECTOMY    . REVISION TOTAL KNEE ARTHROPLASTY     Bilateral  . TONSILLECTOMY      Allergies  Allergen Reactions  . Codeine  Nausea And Vomiting    Outpatient Encounter Medications as of 09/04/2017  Medication Sig  . aspirin 325 MG tablet Take 325 mg by mouth daily.  . furosemide (LASIX) 20 MG tablet Take 1 tablet (20 mg total) by mouth daily.  Marland Kitchen spironolactone (ALDACTONE) 25 MG tablet Take 1 tablet (25 mg total) by mouth daily.  . valsartan (DIOVAN) 160 MG tablet Take 160 mg by mouth daily.   No facility-administered encounter medications on file as of 09/04/2017.     Review of Systems  GENERAL: No change in appetite, no fatigue, no weight changes, no fever, chills or weakness SKIN: Denies rash, itching, wounds, ulcer sores, or nail abnormalities EYES: Denies change in vision, dry eyes, eye pain, itching or discharge EARS: Denies change in hearing, ringing in ears, or earache NOSE: Denies nasal congestion or epistaxis MOUTH and THROAT: Denies oral discomfort, gingival pain or bleeding, pain from teeth or hoarseness   RESPIRATORY: no cough, SOB, DOE, wheezing, hemoptysis CARDIAC: No chest pain, edema or palpitations GI: No abdominal pain, diarrhea, constipation, heart burn, nausea or vomiting GU: Denies dysuria, frequency, hematuria, incontinence, or discharge MUSCULOSKELETAL: Denies joint pain, muscle pain, back pain, restricted movement, or unusual weakness CIRCULATION: Denies claudication, edema of legs, varicosities, or cold extremities NEUROLOGICAL: Denies dizziness, syncope, numbness, or headache PSYCHIATRIC: Denies feelings of depression or anxiety. No report of hallucinations, insomnia, paranoia, or agitation ENDOCRINE: Denies polyphagia, polyuria, polydipsia, heat or cold intolerance HEME/LYMPH: Denies excessive bruising, petechia, enlarged lymph  nodes, or bleeding problems IMMUNOLOGIC: Denies history of frequent infections, AIDS, or use of immunosuppressive agents   Immunization History  Administered Date(s) Administered  . Influenza Whole 05/10/2011  . Influenza,inj,Quad PF,6+ Mos 03/10/2013,  03/24/2014  . Pneumococcal Conjugate-13 03/10/2013  . Pneumococcal Polysaccharide-23 11/29/2015  . Td 07/16/1999, 06/12/2010   Pertinent  Health Maintenance Due  Topic Date Due  . INFLUENZA VACCINE  10/13/2017 (Originally 02/12/2017)  . DEXA SCAN  Completed  . PNA vac Low Risk Adult  Completed   Fall Risk  11/29/2015 11/29/2015 03/27/2014 06/07/2013  Falls in the past year? Yes Yes No No  Number falls in past yr: 1 1 - -  Injury with Fall? No No - -  Risk for fall due to : Other (Comment) - - -  Follow up Education provided Falls evaluation completed - -      Vitals:   09/04/17 1118  BP: (!) 124/52  Pulse: 92  Resp: 18  Temp: 98.5 F (36.9 C)  TempSrc: Oral  SpO2: 96%  Weight: 173 lb 15 oz (78.9 kg)  Height: 5\' 5"  (1.651 m)   Body mass index is 28.95 kg/m.  Physical Exam  GENERAL APPEARANCE: Well nourished. In no acute distress. Normal body habitus SKIN:  Skin is warm and dry. There are no suspicious lesions or rash HEAD: Normal in size and contour. No evidence of trauma EYES: Lids open and close normally. No blepharitis, entropion or ectropion. PERRL. Conjunctivae are clear and sclerae are white. Lenses are without opacity EARS: Pinnae are normal. Patient hears normal voice tunes of the examiner MOUTH and THROAT: Lips are without lesions. Oral mucosa is moist and without lesions. Tongue is normal in shape, size, and color and without lesions NECK: supple, trachea midline, no neck masses, no thyroid tenderness, no thyromegaly LYMPHATICS: No LAN in the neck, no supraclavicular LAN RESPIRATORY: Breathing is even & unlabored, BS CTAB CARDIAC: RRR, no murmur,no extra heart sounds, no edema GI: Abdomen soft, normal BS, no masses, no tenderness, no hepatomegaly, no splenomegaly MUSCULOSKELETAL: No deformities. Movement at each extremity is full and painless. Strength is 5/5 at each extremity. Back is without kyphosis or scoliosis CIRCULATION: Pedal pulses are 2+. There is no  edema of the legs, ankles and feet NEUROLOGICAL: There is no tremor. Speech is clear PSYCHIATRIC: Alert and oriented X 3. Affect and behavior are appropriate  Labs reviewed: Recent Labs    09/01/17 0813 09/02/17 0417 09/03/17 0430  NA 146* 144 144  K 4.3 4.0 4.6  CL 96* 98* 99*  CO2 34* 32 32  GLUCOSE 103* 90 87  BUN 17 21* 37*  CREATININE 1.35* 1.09* 1.28*  CALCIUM 10.4* 10.3 10.2   Recent Labs    08/19/17 1821 08/29/17 1526  AST 40 48*  ALT 24 30  ALKPHOS 67 61  BILITOT 0.9 1.8*  PROT 7.0 6.8  ALBUMIN 3.0* 3.1*   Recent Labs    08/19/17 1821 08/29/17 1526 08/29/17 2104  WBC 6.2 6.6 8.0  NEUTROABS 3.7  --   --   HGB 11.2* 11.6* 12.4  HCT 35.5* 35.5* 36.9  MCV 93.4 93.4 92.5  PLT 136* 139* 149*   Lab Results  Component Value Date   TSH 4.94 (H) 11/29/2015    Lab Results  Component Value Date   CHOL 134 11/29/2015   HDL 43.40 11/29/2015   LDLCALC 74 11/29/2015   LDLDIRECT 144.3 12/04/2011   TRIG 86.0 11/29/2015   CHOLHDL 3 11/29/2015    Significant  Diagnostic Results in last 30 days:  Dg Chest 2 View  Result Date: 08/29/2017 CLINICAL DATA:  Difficulty walking EXAM: CHEST  2 VIEW COMPARISON:  08/19/2017 FINDINGS: Cardiac shadow is enlarged. Aortic calcifications are again seen. The lungs are well aerated bilaterally. Small bilateral pleural effusions are noted. Chronic appearing changes in the left retrocardiac region are noted. IMPRESSION: Small bilateral pleural effusions. Electronically Signed   By: Alcide CleverMark  Lukens M.D.   On: 08/29/2017 16:52   Dg Chest 2 View  Result Date: 08/19/2017 CLINICAL DATA:  Pt c/o leg swelling and SOB x 1 week. Hx of CAD, HTN, AND MI. Pt is a nonsmoker. EXAM: CHEST  2 VIEW COMPARISON:  Chest CT, 11/08/2014.  Chest radiographs, 10/29/2013. FINDINGS: Cardiopericardial silhouette is mildly enlarged. No mediastinal or hilar masses. No convincing adenopathy. There are prominent bronchovascular markings. This is most evident in the  left lower lobe at the left lung base. This is similar to the prior chest CT, consistent with chronic bronchitic change. There is no convincing pneumonia and no evidence of pulmonary edema. No pleural effusion or pneumothorax. Skeletal structures are demineralized but grossly intact. IMPRESSION: 1. No acute cardiopulmonary disease. 2. Mild cardiomegaly. Electronically Signed   By: Amie Portlandavid  Ormond M.D.   On: 08/19/2017 18:12    Assessment/Plan ***   Family/ staff Communication: ***  Labs/tests ordered:  ***  Goals of care:   ***   Kenard GowerMonina Medina-Vargas, NP [facility] [phone] (Monday-Friday 8:00 a.m. - 5:00 p.m.) [phone] (after hours)

## 2017-09-04 NOTE — Telephone Encounter (Signed)
Pt on TCM list: Admitted for evaluation of increased weakness and fall at home and lower extremity edema. D/C'ed on 09/03/2017 to Nursing Home.

## 2017-09-04 NOTE — Progress Notes (Signed)
This encounter was created in error - please disregard.

## 2017-09-04 NOTE — Progress Notes (Signed)
Location:  [facility] Room Number: 107-A Place of Service:  SNF (31) Provider:  Kenard Gower, NP  Patient Care Team: Etta Grandchild, MD as PCP - General  Extended Emergency Contact Information Primary Emergency Contact: Phineas Semen of [place] Home Phone: [phone] Relation: Sister  Code Status:  Full Code  Goals of care: Advanced Directive information Advanced Directives 08/29/2017  Does Patient Have a Medical Advance Directive? No  Type of Advance Directive -  Does patient want to make changes to medical advance directive? -  Copy of Healthcare Power of Attorney in Chart? -  Would patient like information on creating a medical advance directive? No - Patient declined     Chief Complaint  Patient presents with  . Acute Visit    Hospital followup, status post hospitalization at [facility] 2/15-2/20/19 for increased weakness and fall    HPI:  Pt is a 84 y.o. female . seen today for hospital followup.  She was admitted to [facility] on 09/03/17 for short-term rehabilitation following hospitalization at [facility] 2/15-2/20/19 for increased weakness and a fall. She was treated for acute diastolic heart failure. She was, also, treated for hypokalemia which was thought to be due to diuresis.  She has a PMH of hypokalemia, acute diastolic CHF, CAD, GERD, stage I CKD, MI, and hypernatremia.     Past Medical History:  Diagnosis Date  . Anemia   . CAD (coronary artery disease)    a. per patient report, unable to recall any cardiac catheterizations being performed or stents placed  b. NST in 2013 showing inferior and lateral wall scar with mild ischemia --> treated medically  . ESOPHAGEAL STRICTURE   . GERD   . Hyperlipidemia   . Hypertension   . KIDNEY DISEASE, CHRONIC, STAGE I   . MYOCARDIAL INFARCTION, HX OF   . OSTEOARTHRITIS   . PARESTHESIA    Past Surgical History:  Procedure Laterality Date  . ABDOMINAL  HYSTERECTOMY    . REVISION TOTAL KNEE ARTHROPLASTY     Bilateral  . TONSILLECTOMY      Allergies  Allergen Reactions  . Codeine Nausea And Vomiting    Outpatient Encounter Medications as of 09/04/2017  Medication Sig  . aspirin 325 MG tablet Take 325 mg by mouth daily.  . furosemide (LASIX) 20 MG tablet Take 1 tablet (20 mg total) by mouth daily.  Marland Kitchen spironolactone (ALDACTONE) 25 MG tablet Take 1 tablet (25 mg total) by mouth daily.  . valsartan (DIOVAN) 160 MG tablet Take 160 mg by mouth daily.   No facility-administered encounter medications on file as of 09/04/2017.     Review of Systems  GENERAL: No change in appetite, no fatigue, no weight changes, no fever, chills or weakness EARS:  Mild + bilateral hearing loss MOUTH and THROAT: Denies oral discomfort, gingival pain or bleeding RESPIRATORY: no cough, SOB, DOE, wheezing, hemoptysis CARDIAC: No chest pain, or palpitations GI: No abdominal pain, diarrhea, constipation, heart burn, nausea or vomiting GU: +constipation PSYCHIATRIC: Denies feelings of depression or anxiety. No report of hallucinations, insomnia, paranoia, or agitation   Immunization History  Administered Date(s) Administered  . Influenza Whole 05/10/2011  . Influenza,inj,Quad PF,6+ Mos 03/10/2013, 03/24/2014  . Pneumococcal Conjugate-13 03/10/2013  . Pneumococcal Polysaccharide-23 11/29/2015  . Td 07/16/1999, 06/12/2010   Pertinent  Health Maintenance Due  Topic Date Due  . INFLUENZA VACCINE  10/13/2017 (Originally 02/12/2017)  . DEXA SCAN  Completed  . PNA vac Low Risk Adult  Completed   Fall Risk  11/29/2015 11/29/2015 03/27/2014 06/07/2013  Falls in the past year? Yes Yes No No  Number falls in past yr: 1 1 - -  Injury with Fall? No No - -  Risk for fall due to : Other (Comment) - - -  Follow up Education provided Falls evaluation completed - -      Vitals:   09/04/17 1416  BP: (!) 124/52  Pulse: 92  Resp: 18  Temp: 98.5 F (36.9 C)    TempSrc: Oral  SpO2: 96%  Weight: 173 lb 15 oz (78.9 kg)  Height: 5\' 5"  (1.651 m)   Body mass index is 28.95 kg/m.  Physical Exam  GENERAL APPEARANCE: Well nourished. In no acute distress. Normal body habitus SKIN:  Right posterior lower leg wound with dressing MOUTH and THROAT: Lips are without lesions. Oral mucosa is moist and without lesions.  RESPIRATORY: Breathing is even & unlabored, BS CTAB CARDIAC: RRR, no murmur,no extra heart sounds, BLE 2-3+ edema GI: Abdomen soft, normal BS, no masses, no tenderness EXTREMITIES:  Able to move X 4 extremities PSYCHIATRIC: Alert to self and month, disoriented to year. Affect and behavior are appropriate  Labs reviewed: Recent Labs    09/01/17 0813 09/02/17 0417 09/03/17 0430  NA 146* 144 144  K 4.3 4.0 4.6  CL 96* 98* 99*  CO2 34* 32 32  GLUCOSE 103* 90 87  BUN 17 21* 37*  CREATININE 1.35* 1.09* 1.28*  CALCIUM 10.4* 10.3 10.2   Recent Labs    08/19/17 1821 08/29/17 1526  AST 40 48*  ALT 24 30  ALKPHOS 67 61  BILITOT 0.9 1.8*  PROT 7.0 6.8  ALBUMIN 3.0* 3.1*   Recent Labs    08/19/17 1821 08/29/17 1526 08/29/17 2104  WBC 6.2 6.6 8.0  NEUTROABS 3.7  --   --   HGB 11.2* 11.6* 12.4  HCT 35.5* 35.5* 36.9  MCV 93.4 93.4 92.5  PLT 136* 139* 149*   Lab Results  Component Value Date   TSH 4.94 (H) 11/29/2015    Lab Results  Component Value Date   CHOL 134 11/29/2015   HDL 43.40 11/29/2015   LDLCALC 74 11/29/2015   LDLDIRECT 144.3 12/04/2011   TRIG 86.0 11/29/2015   CHOLHDL 3 11/29/2015    Significant Diagnostic Results in last 30 days:  Dg Chest 2 View  Result Date: 08/29/2017 CLINICAL DATA:  Difficulty walking EXAM: CHEST  2 VIEW COMPARISON:  08/19/2017 FINDINGS: Cardiac shadow is enlarged. Aortic calcifications are again seen. The lungs are well aerated bilaterally. Small bilateral pleural effusions are noted. Chronic appearing changes in the left retrocardiac region are noted. IMPRESSION: Small  bilateral pleural effusions. Electronically Signed   By: Alcide Clever M.D.   On: 08/29/2017 16:52   Dg Chest 2 View  Result Date: 08/19/2017 CLINICAL DATA:  Pt c/o leg swelling and SOB x 1 week. Hx of CAD, HTN, AND MI. Pt is a nonsmoker. EXAM: CHEST  2 VIEW COMPARISON:  Chest CT, 11/08/2014.  Chest radiographs, 10/29/2013. FINDINGS: Cardiopericardial silhouette is mildly enlarged. No mediastinal or hilar masses. No convincing adenopathy. There are prominent bronchovascular markings. This is most evident in the left lower lobe at the left lung base. This is similar to the prior chest CT, consistent with chronic bronchitic change. There is no convincing pneumonia and no evidence of pulmonary edema. No pleural effusion or pneumothorax. Skeletal structures are demineralized but grossly intact. IMPRESSION: 1. No acute cardiopulmonary disease. 2. Mild  cardiomegaly. Electronically Signed   By: Amie Portlandavid  Ormond M.D.   On: 08/19/2017 18:12    Assessment/Plan  1. Generalized weakness - for rehabilitation, PT and OT for therapeutic strengthening exercises, fall precautions   2. Acute diastolic CHF (congestive heart failure) (HCC) - continue Lasix 20 mg 1 daily, Aldactone 25 mg 1 tab daily and valsartan 160 mg 1 tab daily, weigh daily,   3. Hypokalemia - was supplemented, will monitor Lab Results  Component Value Date   K 4.6 09/03/2017     4. Atherosclerosis of native coronary artery without angina pectoris, unspecified whether native or transplanted heart - continue ASA 325 mg 1 tab daily and Valsartan 160 mg 1 tab daily  5. Constipation - start Senna-S 8.6 mg 2 tabs PO Q HS     Family/ staff Communication: Discussed plan of care with patient.  Labs/tests ordered:  CBC, BMP, hgbA1C  Goals of care:   Short-term rehabilitation   Kenard GowerMonina Medina-Vargas, NP [facility] [phone] (Monday-Friday 8:00 a.m. - 5:00 p.m.) [phone] (after hours)\

## 2017-09-08 ENCOUNTER — Telehealth: Payer: Self-pay | Admitting: Internal Medicine

## 2017-09-08 DIAGNOSIS — I5022 Chronic systolic (congestive) heart failure: Secondary | ICD-10-CM | POA: Diagnosis not present

## 2017-09-08 DIAGNOSIS — D509 Iron deficiency anemia, unspecified: Secondary | ICD-10-CM | POA: Diagnosis not present

## 2017-09-08 DIAGNOSIS — E876 Hypokalemia: Secondary | ICD-10-CM | POA: Diagnosis not present

## 2017-09-08 DIAGNOSIS — I252 Old myocardial infarction: Secondary | ICD-10-CM | POA: Diagnosis not present

## 2017-09-08 DIAGNOSIS — Z9181 History of falling: Secondary | ICD-10-CM | POA: Diagnosis not present

## 2017-09-08 DIAGNOSIS — L89322 Pressure ulcer of left buttock, stage 2: Secondary | ICD-10-CM | POA: Diagnosis not present

## 2017-09-08 DIAGNOSIS — N181 Chronic kidney disease, stage 1: Secondary | ICD-10-CM | POA: Diagnosis not present

## 2017-09-08 DIAGNOSIS — R238 Other skin changes: Secondary | ICD-10-CM | POA: Diagnosis not present

## 2017-09-08 DIAGNOSIS — I83018 Varicose veins of right lower extremity with ulcer other part of lower leg: Secondary | ICD-10-CM | POA: Diagnosis not present

## 2017-09-08 DIAGNOSIS — I130 Hypertensive heart and chronic kidney disease with heart failure and stage 1 through stage 4 chronic kidney disease, or unspecified chronic kidney disease: Secondary | ICD-10-CM | POA: Diagnosis not present

## 2017-09-08 DIAGNOSIS — Z7982 Long term (current) use of aspirin: Secondary | ICD-10-CM | POA: Diagnosis not present

## 2017-09-08 DIAGNOSIS — E669 Obesity, unspecified: Secondary | ICD-10-CM | POA: Diagnosis not present

## 2017-09-08 LAB — CBC AND DIFFERENTIAL
HCT: 37 (ref 36–46)
Hemoglobin: 12.3 (ref 12.0–16.0)
Neutrophils Absolute: 7
PLATELETS: 213 (ref 150–399)
WBC: 9.6

## 2017-09-08 LAB — BASIC METABOLIC PANEL
BUN: 48 — AB (ref 4–21)
CREATININE: 1.2 — AB (ref 0.5–1.1)
Glucose: 81
Potassium: 4.4 (ref 3.4–5.3)
Sodium: 147 (ref 137–147)

## 2017-09-08 NOTE — Telephone Encounter (Signed)
Copied from CRM [id number]. Topic: Quick Communication - See Telephone Encounter >> Sep 08, 2017 10:26 AM Arlyss Gandyichardson, [place] Mittman N, NT wrote: CRM for notification. See Telephone encounter for: Pts sister calling requesting a call from Dr. Yetta BarreJones regarding her sister being in the nursing home. She is requesting a power of attorney form because her sister is unable to make decisions and care for herself. She would like a call to discuss this. Wynona CanesChristine states it will be best to reach her this evening.   09/08/17.

## 2017-09-08 NOTE — Telephone Encounter (Signed)
lvm for Christine to call back.   RE: POA - we are not able to do POA forms. Sister will need to contact a Clinical research associatelawyer. I can not give any more detail without DPR and/or POA

## 2017-09-09 NOTE — Telephone Encounter (Signed)
Natalie KalataMichael, Natalie Goodman, NT 09/09/2017 08:34 AM  I informed patient sister. She was not understanding. States the lady that put pt into the nursing home is only kin by marriage.

## 2017-09-10 ENCOUNTER — Non-Acute Institutional Stay (SKILLED_NURSING_FACILITY): Payer: Medicare HMO | Admitting: Internal Medicine

## 2017-09-10 ENCOUNTER — Encounter: Payer: Self-pay | Admitting: Internal Medicine

## 2017-09-10 DIAGNOSIS — I10 Essential (primary) hypertension: Secondary | ICD-10-CM | POA: Diagnosis not present

## 2017-09-10 DIAGNOSIS — I5031 Acute diastolic (congestive) heart failure: Secondary | ICD-10-CM

## 2017-09-10 DIAGNOSIS — E870 Hyperosmolality and hypernatremia: Secondary | ICD-10-CM | POA: Diagnosis not present

## 2017-09-10 LAB — HEMOGLOBIN A1C: HEMOGLOBIN A1C: 4.7

## 2017-09-10 NOTE — Progress Notes (Signed)
Location:  [facility] Room Number: 223-A Place of Service:  SNF [phone]) Provider:  Mahlon Gammon., MD  Patient Care Team: Etta Grandchild, MD as PCP - General  Extended Emergency Contact Information Primary Emergency Contact: Kyra Leyland Address: [place], [place] Home Phone: [phone] Relation: Other  Code Status:  Full Code Goals of care: Advanced Directive information Advanced Directives 08/29/2017  Does Patient Have a Medical Advance Directive? No  Type of Advance Directive -  Does patient want to make changes to medical advance directive? -  Copy of Healthcare Power of Attorney in Chart? -  Would patient like information on creating a medical advance directive? No - Patient declined     Chief Complaint  Patient presents with  . New Admit To SNF    New admission to [facility] SNF    HPI:  Pt is a 84 y.o. female seen today for an acute visit for new Admission to facility for Rehab.after staying in the hospital from 02/15-02/20  Patient has h/o Hypertension, CAD, CKD and CHF and Venous stasis Ulcer  She came to the hospital for recurrent falls and LE edema. She was seen by her Primary care few weeks ago and started on Aldactone. Her valsartan was stopped. In the hospital she was started on Lasix and she lost almost  20 lbs. She weighs 166 lbs in facility down from  190 lb. She was transferred to Facility for Rehab. We were called to evaluate patient as she almost passed out in therapy this morning. Per Nurses she had similar episode yesterday in the Bathroom. Patient was sitting in the Wheelchair and said that she felt dizzy like she was going to pass out.. She denies any cough , SOB or chest pain. Her SBP was in 80.  Patient was living by herself and was independent in her ADLS. Not driving. Her sister helps her sometimes. She says she wants to go home and will hire help.  Past Medical History:  Diagnosis Date  . Anemia   . CAD  (coronary artery disease)    a. per patient report, unable to recall any cardiac catheterizations being performed or stents placed  b. NST in 2013 showing inferior and lateral wall scar with mild ischemia --> treated medically  . ESOPHAGEAL STRICTURE   . GERD   . Hyperlipidemia   . Hypertension   . KIDNEY DISEASE, CHRONIC, STAGE I   . MYOCARDIAL INFARCTION, HX OF   . OSTEOARTHRITIS   . PARESTHESIA    Past Surgical History:  Procedure Laterality Date  . ABDOMINAL HYSTERECTOMY    . REVISION TOTAL KNEE ARTHROPLASTY     Bilateral  . TONSILLECTOMY      Allergies  Allergen Reactions  . Codeine Nausea And Vomiting    Outpatient Encounter Medications as of 09/10/2017  Medication Sig  . aspirin 325 MG tablet Take 325 mg by mouth daily.  . furosemide (LASIX) 20 MG tablet Take 1 tablet (20 mg total) by mouth daily.  . sennosides-docusate sodium (SENOKOT-S) 8.6-50 MG tablet Take 2 tablets by mouth at bedtime.  Marland Kitchen spironolactone (ALDACTONE) 25 MG tablet Take 1 tablet (25 mg total) by mouth daily.  . valsartan (DIOVAN) 160 MG tablet Take 160 mg by mouth daily.   No facility-administered encounter medications on file as of 09/10/2017.     Review of Systems  Review of Systems  Constitutional: Negative for activity change, appetite change, chills, diaphoresis, fatigue  and fever.  HENT: Negative for mouth sores, postnasal drip, rhinorrhea, sinus pain and sore throat.   Respiratory: Negative for apnea, cough, chest tightness, shortness of breath and wheezing.   Cardiovascular: Negative for chest pain, palpitations and leg swelling.  Gastrointestinal: Negative for abdominal distention, abdominal pain, constipation, diarrhea, nausea and vomiting.  Genitourinary: Negative for dysuria and frequency.  Musculoskeletal: Negative for arthralgias, joint swelling and myalgias.  Skin: Negative for rash.  Neurological: Positive for  for dizziness, syncope, weakness, light-headedness.    Psychiatric/Behavioral: Negative for behavioral problems, confusion and sleep disturbance.     Immunization History  Administered Date(s) Administered  . Influenza Whole 05/10/2011  . Influenza,inj,Quad PF,6+ Mos 03/10/2013, 03/24/2014  . Pneumococcal Conjugate-13 03/10/2013  . Pneumococcal Polysaccharide-23 11/29/2015  . Td 07/16/1999, 06/12/2010   Pertinent  Health Maintenance Due  Topic Date Due  . INFLUENZA VACCINE  10/13/2017 (Originally 02/12/2017)  . DEXA SCAN  Completed  . PNA vac Low Risk Adult  Completed   Fall Risk  11/29/2015 11/29/2015 03/27/2014 06/07/2013  Falls in the past year? Yes Yes No No  Number falls in past yr: 1 1 - -  Injury with Fall? No No - -  Risk for fall due to : Other (Comment) - - -  Follow up Education provided Falls evaluation completed - -   Functional Status Survey:    Vitals:   09/10/17 1011  BP: 100/60  Pulse: 65  Resp: 17  Temp: 98 F (36.7 C)  TempSrc: Oral  SpO2: 96%  Weight: 173 lb 15 oz (78.9 kg)  Height: 5\' 5"  (1.651 m)   Body mass index is 28.95 kg/m. Physical Exam  Constitutional: She appears well-developed and well-nourished.  HENT:  Head: Normocephalic.  Mouth/Throat: Oropharynx is clear and moist.  Eyes: Pupils are equal, round, and reactive to light.  Neck: Neck supple.  Cardiovascular: Normal rate and normal heart sounds.  No murmur heard. Pulmonary/Chest: Effort normal and breath sounds normal. No respiratory distress. She has no wheezes. She has no rales.  Abdominal: Soft. Bowel sounds are normal. She exhibits no distension. There is no tenderness. There is no rebound.  Musculoskeletal:  Has trace edema in Her legs with Venous stasis ulcer on Right LE.  Lymphadenopathy:    She has no cervical adenopathy.  Neurological: She is alert.  Did Have good strength in UE. 4/5 in both LE. No Focal deficits.  Was alert and Oriented to time an d place.  Skin:  Venous stasis Ulcer in LE.No odor. Base was clear.    Psychiatric: She has a normal mood and affect. Her behavior is normal. Thought content normal.    Labs reviewed: Recent Labs    09/01/17 0813 09/02/17 0417 09/03/17 0430  NA 146* 144 144  K 4.3 4.0 4.6  CL 96* 98* 99*  CO2 34* 32 32  GLUCOSE 103* 90 87  BUN 17 21* 37*  CREATININE 1.35* 1.09* 1.28*  CALCIUM 10.4* 10.3 10.2   Recent Labs    08/19/17 1821 08/29/17 1526  AST 40 48*  ALT 24 30  ALKPHOS 67 61  BILITOT 0.9 1.8*  PROT 7.0 6.8  ALBUMIN 3.0* 3.1*   Recent Labs    08/19/17 1821 08/29/17 1526 08/29/17 2104  WBC 6.2 6.6 8.0  NEUTROABS 3.7  --   --   HGB 11.2* 11.6* 12.4  HCT 35.5* 35.5* 36.9  MCV 93.4 93.4 92.5  PLT 136* 139* 149*   Lab Results  Component Value Date  TSH 4.94 (H) 11/29/2015   No results found for: HGBA1C Lab Results  Component Value Date   CHOL 134 11/29/2015   HDL 43.40 11/29/2015   LDLCALC 74 11/29/2015   LDLDIRECT 144.3 12/04/2011   TRIG 86.0 11/29/2015   CHOLHDL 3 11/29/2015    Significant Diagnostic Results in last 30 days:  Dg Chest 2 View  Result Date: 08/29/2017 CLINICAL DATA:  Difficulty walking EXAM: CHEST  2 VIEW COMPARISON:  08/19/2017 FINDINGS: Cardiac shadow is enlarged. Aortic calcifications are again seen. The lungs are well aerated bilaterally. Small bilateral pleural effusions are noted. Chronic appearing changes in the left retrocardiac region are noted. IMPRESSION: Small bilateral pleural effusions. Electronically Signed   By: Alcide CleverMark  Lukens M.D.   On: 08/29/2017 16:52   Dg Chest 2 View  Result Date: 08/19/2017 CLINICAL DATA:  Pt c/o leg swelling and SOB x 1 week. Hx of CAD, HTN, AND MI. Pt is a nonsmoker. EXAM: CHEST  2 VIEW COMPARISON:  Chest CT, 11/08/2014.  Chest radiographs, 10/29/2013. FINDINGS: Cardiopericardial silhouette is mildly enlarged. No mediastinal or hilar masses. No convincing adenopathy. There are prominent bronchovascular markings. This is most evident in the left lower lobe at the left lung  base. This is similar to the prior chest CT, consistent with chronic bronchitic change. There is no convincing pneumonia and no evidence of pulmonary edema. No pleural effusion or pneumothorax. Skeletal structures are demineralized but grossly intact. IMPRESSION: 1. No acute cardiopulmonary disease. 2. Mild cardiomegaly. Electronically Signed   By: Amie Portlandavid  Ormond M.D.   On: 08/19/2017 18:12    Assessment/Plan Dizziness with Hypotension  Patient seems Mildly Dehydrated . She had Labs done in facility and it shows Sodium 147 BUN is elevated 48 and creat of 1.2 White count is normal.  Will Discontinue her valsartan due to Postural hypotension Hold Lasix and Aldactone right now. No therapy for 24 hours. Encourage PO fluids. Vitals Q shift. Repeat labs in am. And reassess Also Daily weights. Will monitor her closely to see if to start her on Lasix again.  CHF Her echo showed EF of 60% in hospital. Dehydrated today. Will hold diuresis. Will follow closely. Hypertension Patient Hypotensive with Valsartan Will discontinue it. Right Lower Extremity Ulcer and Stage 2 pressure wound in her back D/W Nurse . She is getting Silver alginate dressing.for her venous ulcer. Change to Hydrogel and increase the frequency of dressing  ? Diabetes  Patient says she was never told she has diabetes. Not on any meds. Will do accu check for few days. A1C Pending. Disposition Patient would benefit from Assisted living  But she wants to go home with help   Family/ staff Communication:   Labs/tests ordered:  Repeat Bmp in am Total time spent in this patient care encounter was 45_ minutes; greater than 50% of the visit spent counseling patient, reviewing records , Labs and coordinating care for problems addressed at this encounter.

## 2017-09-11 ENCOUNTER — Non-Acute Institutional Stay (SKILLED_NURSING_FACILITY): Payer: Medicare HMO | Admitting: Adult Health

## 2017-09-11 ENCOUNTER — Encounter: Payer: Self-pay | Admitting: Adult Health

## 2017-09-11 DIAGNOSIS — N183 Chronic kidney disease, stage 3 (moderate): Secondary | ICD-10-CM | POA: Diagnosis not present

## 2017-09-11 DIAGNOSIS — N179 Acute kidney failure, unspecified: Secondary | ICD-10-CM

## 2017-09-11 DIAGNOSIS — E870 Hyperosmolality and hypernatremia: Secondary | ICD-10-CM | POA: Diagnosis not present

## 2017-09-11 NOTE — Progress Notes (Signed)
Location:  [facility] Room Number: 107-A Place of Service:  SNF (31) Provider:  Durenda Age, NP  Patient Care Team: Janith Lima, MD as PCP - General  Extended Emergency Contact Information Primary Emergency Contact: Locklear,Ruby Address: [address] [place], [place] Home Phone: [phone] Relation: Other  Code Status:  Full Code  Goals of care: Advanced Directive information Advanced Directives 08/29/2017  Does Patient Have a Medical Advance Directive? No  Type of Advance Directive -  Does patient want to make changes to medical advance directive? -  Copy of [place] in Chart? -  Would patient like information on creating a medical advance directive? No - Patient declined     Chief Complaint  Patient presents with  . Acute Visit    Patient has had multiple syncopal spells    HPI:  Pt is a 84 y.o. female seen today for an acute visit due to continued syncopal episodes.  She is a short-term rehabilitation resident at [facility].  She has a PMH of hypokalemia, acute diastolic CHF, CAD, GERD, stage I CKD, MI, and hypernatremia. Lasix and Aldactone were ordered to be held but were given this morning. Latest BP 136/74. Latest labs - glucose 90, creatinine 1.24, BUN 38.4, Na 147, K 4.4 eGFR 51.41. She is verbally responsive. She was seen in her room today and was drinking nectar thick water and juice.   Past Medical History:  Diagnosis Date  . Anemia   . CAD (coronary artery disease)    a. per patient report, unable to recall any cardiac catheterizations being performed or stents placed  b. NST in 2013 showing inferior and lateral wall scar with mild ischemia --> treated medically  . ESOPHAGEAL STRICTURE   . GERD   . Hyperlipidemia   . Hypertension   . KIDNEY DISEASE, CHRONIC, STAGE I   . MYOCARDIAL INFARCTION, HX OF   . OSTEOARTHRITIS   . PARESTHESIA    Past Surgical History:    Procedure Laterality Date  . ABDOMINAL HYSTERECTOMY    . REVISION TOTAL KNEE ARTHROPLASTY     Bilateral  . TONSILLECTOMY      Allergies  Allergen Reactions  . Codeine Nausea And Vomiting    Outpatient Encounter Medications as of 09/11/2017  Medication Sig  . aspirin 325 MG tablet Take 325 mg by mouth daily.  . furosemide (LASIX) 20 MG tablet Take 1 tablet (20 mg total) by mouth daily.  Marland Kitchen NUTRITIONAL SUPPLEMENT LIQD Take 120 mLs by mouth daily. MedPass  . sennosides-docusate sodium (SENOKOT-S) 8.6-50 MG tablet Take 2 tablets by mouth at bedtime.  Marland Kitchen spironolactone (ALDACTONE) 25 MG tablet Take 1 tablet (25 mg total) by mouth daily.  . [DISCONTINUED] valsartan (DIOVAN) 160 MG tablet Take 160 mg by mouth daily.   No facility-administered encounter medications on file as of 09/11/2017.     Review of Systems  GENERAL: No change in appetite, no fatigue, no weight changes, no fever, chills MOUTH and THROAT: Denies oral discomfort, gingival pain or bleeding, pain from teeth or hoarseness   RESPIRATORY: no cough, SOB, DOE, wheezing, hemoptysis CARDIAC: No chest pain, edema or palpitations GI: No abdominal pain, diarrhea, constipation, heart burn, nausea or vomiting PSYCHIATRIC: Denies feelings of depression or anxiety. No report of hallucinations, insomnia, paranoia, or agitation   Immunization History  Administered Date(s) Administered  . Influenza Whole 05/10/2011  . Influenza,inj,Quad PF,6+ Mos 03/10/2013, 03/24/2014  .  Pneumococcal Conjugate-13 03/10/2013  . Pneumococcal Polysaccharide-23 11/29/2015  . Td 07/16/1999, 06/12/2010   Pertinent  Health Maintenance Due  Topic Date Due  . INFLUENZA VACCINE  10/13/2017 (Originally 02/12/2017)  . DEXA SCAN  Completed  . PNA vac Low Risk Adult  Completed   Fall Risk  11/29/2015 11/29/2015 03/27/2014 06/07/2013  Falls in the past year? Yes Yes No No  Number falls in past yr: 1 1 - -  Injury with Fall? No No - -  Risk for fall due to :  Other (Comment) - - -  Follow up Education provided Falls evaluation completed - -      Vitals:   09/11/17 0909  BP: 136/74  Pulse: (!) 102  Resp: 20  Temp: 98.4 F (36.9 C)  TempSrc: Oral  SpO2: 92%  Weight: 167 lb (75.8 kg)  Height: '5\' 5"'$  (1.651 m)   Body mass index is 27.79 kg/m.  Physical Exam  GENERAL APPEARANCE: Well nourished. In no acute distress. Normal body habitus SKIN:  Has venous stasis to right lower leg with dressing MOUTH and THROAT: Lips are without lesions. Oral mucosa is moist and without lesions. Tongue is normal in shape, size, and color and without lesions RESPIRATORY: Breathing is even & unlabored, BS CTAB CARDIAC: RRR, no murmur,no extra heart sounds, no edema GI: Abdomen soft, normal BS, no masses, no tenderness, no hepatomegaly, no splenomegaly EXTREMITIES:  Able to move X 4 extremities PSYCHIATRIC: Alert to self, disoriented to time and place. Affect and behavior are appropriate  Labs reviewed: 09/11/17  glucose 90 calcium 10.9 creatinine 1.24 BUN 38.4 sodium 147 K4.4 eGFR51.41 Recent Labs    09/01/17 0813 09/02/17 0417 09/03/17 0430  NA 146* 144 144  K 4.3 4.0 4.6  CL 96* 98* 99*  CO2 34* 32 32  GLUCOSE 103* 90 87  BUN 17 21* 37*  CREATININE 1.35* 1.09* 1.28*  CALCIUM 10.4* 10.3 10.2   Recent Labs    08/19/17 1821 08/29/17 1526  AST 40 48*  ALT 24 30  ALKPHOS 67 61  BILITOT 0.9 1.8*  PROT 7.0 6.8  ALBUMIN 3.0* 3.1*   Recent Labs    08/19/17 1821 08/29/17 1526 08/29/17 2104  WBC 6.2 6.6 8.0  NEUTROABS 3.7  --   --   HGB 11.2* 11.6* 12.4  HCT 35.5* 35.5* 36.9  MCV 93.4 93.4 92.5  PLT 136* 139* 149*   Lab Results  Component Value Date   TSH 4.94 (H) 11/29/2015    Lab Results  Component Value Date   CHOL 134 11/29/2015   HDL 43.40 11/29/2015   LDLCALC 74 11/29/2015   LDLDIRECT 144.3 12/04/2011   TRIG 86.0 11/29/2015   CHOLHDL 3 11/29/2015    Significant Diagnostic Results in last 30 days:  Dg Chest 2  View  Result Date: 08/29/2017 CLINICAL DATA:  Difficulty walking EXAM: CHEST  2 VIEW COMPARISON:  08/19/2017 FINDINGS: Cardiac shadow is enlarged. Aortic calcifications are again seen. The lungs are well aerated bilaterally. Small bilateral pleural effusions are noted. Chronic appearing changes in the left retrocardiac region are noted. IMPRESSION: Small bilateral pleural effusions. Electronically Signed   By: Inez Catalina M.D.   On: 08/29/2017 16:52   Dg Chest 2 View  Result Date: 08/19/2017 CLINICAL DATA:  Pt c/o leg swelling and SOB x 1 week. Hx of CAD, HTN, AND MI. Pt is a nonsmoker. EXAM: CHEST  2 VIEW COMPARISON:  Chest CT, 11/08/2014.  Chest radiographs, 10/29/2013. FINDINGS: Cardiopericardial silhouette is mildly  enlarged. No mediastinal or hilar masses. No convincing adenopathy. There are prominent bronchovascular markings. This is most evident in the left lower lobe at the left lung base. This is similar to the prior chest CT, consistent with chronic bronchitic change. There is no convincing pneumonia and no evidence of pulmonary edema. No pleural effusion or pneumothorax. Skeletal structures are demineralized but grossly intact. IMPRESSION: 1. No acute cardiopulmonary disease. 2. Mild cardiomegaly. Electronically Signed   By: Lajean Manes M.D.   On: 08/19/2017 18:12    Assessment/Plan  1. Hypernatremia - Na 147, will discontinue Lasix and Aldactone, encourage oral fuids   2. Acute on chronic kidney disease, stage 3 - creatinine 1.24, will monitor    Durenda Age, NP [facility] [phone] (Monday-Friday 8:00 a.m. - 5:00 p.m.) [phone] (after hours)

## 2017-09-15 DIAGNOSIS — I83018 Varicose veins of right lower extremity with ulcer other part of lower leg: Secondary | ICD-10-CM | POA: Diagnosis not present

## 2017-09-15 DIAGNOSIS — L89322 Pressure ulcer of left buttock, stage 2: Secondary | ICD-10-CM | POA: Diagnosis not present

## 2017-09-16 LAB — BASIC METABOLIC PANEL
BUN: 45 — AB (ref 4–21)
Creatinine: 1.5 — AB (ref 0.5–1.1)
Glucose: 107
POTASSIUM: 4.8 (ref 3.4–5.3)
Sodium: 152 — AB (ref 137–147)

## 2017-09-16 LAB — CBC AND DIFFERENTIAL
HEMATOCRIT: 41 (ref 36–46)
HEMOGLOBIN: 12.8 (ref 12.0–16.0)
NEUTROS ABS: 12
PLATELETS: 321 (ref 150–399)
WBC: 14.6

## 2017-09-17 ENCOUNTER — Other Ambulatory Visit: Payer: Self-pay

## 2017-09-17 ENCOUNTER — Inpatient Hospital Stay (HOSPITAL_COMMUNITY)
Admission: EM | Admit: 2017-09-17 | Discharge: 2017-09-23 | DRG: 853 | Disposition: A | Discharge status: Hospice | Payer: Medicare HMO | Attending: Internal Medicine | Admitting: Internal Medicine

## 2017-09-17 ENCOUNTER — Non-Acute Institutional Stay (SKILLED_NURSING_FACILITY): Payer: Medicare HMO | Admitting: Adult Health

## 2017-09-17 ENCOUNTER — Emergency Department (HOSPITAL_COMMUNITY): Payer: Medicare HMO

## 2017-09-17 ENCOUNTER — Encounter (HOSPITAL_COMMUNITY): Payer: Self-pay | Admitting: Emergency Medicine

## 2017-09-17 ENCOUNTER — Encounter: Payer: Self-pay | Admitting: Adult Health

## 2017-09-17 DIAGNOSIS — D72829 Elevated white blood cell count, unspecified: Secondary | ICD-10-CM

## 2017-09-17 DIAGNOSIS — Z6828 Body mass index (BMI) 28.0-28.9, adult: Secondary | ICD-10-CM

## 2017-09-17 DIAGNOSIS — Z79899 Other long term (current) drug therapy: Secondary | ICD-10-CM

## 2017-09-17 DIAGNOSIS — R6520 Severe sepsis without septic shock: Secondary | ICD-10-CM | POA: Diagnosis present

## 2017-09-17 DIAGNOSIS — N183 Chronic kidney disease, stage 3 unspecified: Secondary | ICD-10-CM | POA: Diagnosis present

## 2017-09-17 DIAGNOSIS — Z66 Do not resuscitate: Secondary | ICD-10-CM | POA: Diagnosis present

## 2017-09-17 DIAGNOSIS — E871 Hypo-osmolality and hyponatremia: Secondary | ICD-10-CM | POA: Diagnosis not present

## 2017-09-17 DIAGNOSIS — B370 Candidal stomatitis: Secondary | ICD-10-CM

## 2017-09-17 DIAGNOSIS — L89153 Pressure ulcer of sacral region, stage 3: Secondary | ICD-10-CM | POA: Diagnosis not present

## 2017-09-17 DIAGNOSIS — E43 Unspecified severe protein-calorie malnutrition: Secondary | ICD-10-CM | POA: Diagnosis present

## 2017-09-17 DIAGNOSIS — R0902 Hypoxemia: Secondary | ICD-10-CM | POA: Diagnosis not present

## 2017-09-17 DIAGNOSIS — N179 Acute kidney failure, unspecified: Secondary | ICD-10-CM

## 2017-09-17 DIAGNOSIS — I5033 Acute on chronic diastolic (congestive) heart failure: Secondary | ICD-10-CM | POA: Diagnosis not present

## 2017-09-17 DIAGNOSIS — L8990 Pressure ulcer of unspecified site, unspecified stage: Secondary | ICD-10-CM | POA: Diagnosis present

## 2017-09-17 DIAGNOSIS — E785 Hyperlipidemia, unspecified: Secondary | ICD-10-CM | POA: Diagnosis present

## 2017-09-17 DIAGNOSIS — M1990 Unspecified osteoarthritis, unspecified site: Secondary | ICD-10-CM | POA: Diagnosis present

## 2017-09-17 DIAGNOSIS — I2510 Atherosclerotic heart disease of native coronary artery without angina pectoris: Secondary | ICD-10-CM | POA: Diagnosis present

## 2017-09-17 DIAGNOSIS — G9341 Metabolic encephalopathy: Secondary | ICD-10-CM | POA: Diagnosis present

## 2017-09-17 DIAGNOSIS — Z7401 Bed confinement status: Secondary | ICD-10-CM

## 2017-09-17 DIAGNOSIS — Z90710 Acquired absence of both cervix and uterus: Secondary | ICD-10-CM

## 2017-09-17 DIAGNOSIS — Z7982 Long term (current) use of aspirin: Secondary | ICD-10-CM

## 2017-09-17 DIAGNOSIS — Z96653 Presence of artificial knee joint, bilateral: Secondary | ICD-10-CM | POA: Diagnosis present

## 2017-09-17 DIAGNOSIS — I5032 Chronic diastolic (congestive) heart failure: Secondary | ICD-10-CM | POA: Diagnosis present

## 2017-09-17 DIAGNOSIS — L89313 Pressure ulcer of right buttock, stage 3: Secondary | ICD-10-CM | POA: Diagnosis present

## 2017-09-17 DIAGNOSIS — I130 Hypertensive heart and chronic kidney disease with heart failure and stage 1 through stage 4 chronic kidney disease, or unspecified chronic kidney disease: Secondary | ICD-10-CM | POA: Diagnosis not present

## 2017-09-17 DIAGNOSIS — L89323 Pressure ulcer of left buttock, stage 3: Secondary | ICD-10-CM | POA: Diagnosis present

## 2017-09-17 DIAGNOSIS — I5022 Chronic systolic (congestive) heart failure: Secondary | ICD-10-CM | POA: Diagnosis not present

## 2017-09-17 DIAGNOSIS — A419 Sepsis, unspecified organism: Principal | ICD-10-CM | POA: Diagnosis present

## 2017-09-17 DIAGNOSIS — F0390 Unspecified dementia without behavioral disturbance: Secondary | ICD-10-CM | POA: Diagnosis present

## 2017-09-17 DIAGNOSIS — E870 Hyperosmolality and hypernatremia: Secondary | ICD-10-CM | POA: Diagnosis present

## 2017-09-17 DIAGNOSIS — Z515 Encounter for palliative care: Secondary | ICD-10-CM

## 2017-09-17 DIAGNOSIS — L97818 Non-pressure chronic ulcer of other part of right lower leg with other specified severity: Secondary | ICD-10-CM | POA: Diagnosis present

## 2017-09-17 DIAGNOSIS — L89159 Pressure ulcer of sacral region, unspecified stage: Secondary | ICD-10-CM | POA: Diagnosis not present

## 2017-09-17 DIAGNOSIS — E860 Dehydration: Secondary | ICD-10-CM | POA: Diagnosis present

## 2017-09-17 DIAGNOSIS — Z8249 Family history of ischemic heart disease and other diseases of the circulatory system: Secondary | ICD-10-CM

## 2017-09-17 DIAGNOSIS — I959 Hypotension, unspecified: Secondary | ICD-10-CM

## 2017-09-17 DIAGNOSIS — E1122 Type 2 diabetes mellitus with diabetic chronic kidney disease: Secondary | ICD-10-CM | POA: Diagnosis present

## 2017-09-17 DIAGNOSIS — I10 Essential (primary) hypertension: Secondary | ICD-10-CM | POA: Diagnosis present

## 2017-09-17 DIAGNOSIS — Z7189 Other specified counseling: Secondary | ICD-10-CM

## 2017-09-17 DIAGNOSIS — R627 Adult failure to thrive: Secondary | ICD-10-CM | POA: Diagnosis not present

## 2017-09-17 DIAGNOSIS — E669 Obesity, unspecified: Secondary | ICD-10-CM | POA: Diagnosis present

## 2017-09-17 DIAGNOSIS — R079 Chest pain, unspecified: Secondary | ICD-10-CM | POA: Diagnosis not present

## 2017-09-17 DIAGNOSIS — M726 Necrotizing fasciitis: Secondary | ICD-10-CM | POA: Diagnosis not present

## 2017-09-17 DIAGNOSIS — L89893 Pressure ulcer of other site, stage 3: Secondary | ICD-10-CM | POA: Diagnosis present

## 2017-09-17 DIAGNOSIS — E1152 Type 2 diabetes mellitus with diabetic peripheral angiopathy with gangrene: Secondary | ICD-10-CM | POA: Diagnosis present

## 2017-09-17 DIAGNOSIS — I252 Old myocardial infarction: Secondary | ICD-10-CM

## 2017-09-17 DIAGNOSIS — K219 Gastro-esophageal reflux disease without esophagitis: Secondary | ICD-10-CM | POA: Diagnosis present

## 2017-09-17 DIAGNOSIS — L89154 Pressure ulcer of sacral region, stage 4: Secondary | ICD-10-CM | POA: Diagnosis not present

## 2017-09-17 DIAGNOSIS — R4182 Altered mental status, unspecified: Secondary | ICD-10-CM | POA: Diagnosis not present

## 2017-09-17 DIAGNOSIS — Z885 Allergy status to narcotic agent status: Secondary | ICD-10-CM

## 2017-09-17 LAB — COMPREHENSIVE METABOLIC PANEL
ALT: 21 U/L (ref 14–54)
ANION GAP: 19 — AB (ref 5–15)
AST: 33 U/L (ref 15–41)
Albumin: 2.7 g/dL — ABNORMAL LOW (ref 3.5–5.0)
Alkaline Phosphatase: 107 U/L (ref 38–126)
BUN: 70 mg/dL — ABNORMAL HIGH (ref 6–20)
CALCIUM: 10.8 mg/dL — AB (ref 8.9–10.3)
CO2: 23 mmol/L (ref 22–32)
Chloride: 110 mmol/L (ref 101–111)
Creatinine, Ser: 2.84 mg/dL — ABNORMAL HIGH (ref 0.44–1.00)
GFR calc non Af Amer: 14 mL/min — ABNORMAL LOW (ref >60)
GFR, EST AFRICAN AMERICAN: 16 mL/min — AB (ref >60)
Glucose, Bld: 113 mg/dL — ABNORMAL HIGH (ref 65–99)
POTASSIUM: 4.2 mmol/L (ref 3.5–5.1)
SODIUM: 152 mmol/L — AB (ref 135–145)
Total Bilirubin: 1.4 mg/dL — ABNORMAL HIGH (ref 0.3–1.2)
Total Protein: 8.9 g/dL — ABNORMAL HIGH (ref 6.5–8.1)

## 2017-09-17 LAB — BASIC METABOLIC PANEL
BUN: 61 — AB (ref 4–21)
CREATININE: 2.4 — AB (ref 0.5–1.1)
Glucose: 111
POTASSIUM: 4.5 (ref 3.4–5.3)
SODIUM: 153 — AB (ref 137–147)

## 2017-09-17 LAB — I-STAT CG4 LACTIC ACID, ED: LACTIC ACID, VENOUS: 1.99 mmol/L — AB (ref 0.5–1.9)

## 2017-09-17 LAB — CBC WITH DIFFERENTIAL/PLATELET
BASOS ABS: 0 10*3/uL (ref 0.0–0.1)
BASOS PCT: 0 %
Eosinophils Absolute: 0.1 10*3/uL (ref 0.0–0.7)
Eosinophils Relative: 0 %
HCT: 40.4 % (ref 36.0–46.0)
HEMOGLOBIN: 13.2 g/dL (ref 12.0–15.0)
Lymphocytes Relative: 5 %
Lymphs Abs: 1.1 10*3/uL (ref 0.7–4.0)
MCH: 31.5 pg (ref 26.0–34.0)
MCHC: 32.7 g/dL (ref 30.0–36.0)
MCV: 96.4 fL (ref 78.0–100.0)
MONOS PCT: 10 %
Monocytes Absolute: 2.3 10*3/uL — ABNORMAL HIGH (ref 0.1–1.0)
NEUTROS ABS: 19 10*3/uL — AB (ref 1.7–7.7)
NEUTROS PCT: 85 %
Platelets: 343 10*3/uL (ref 150–400)
RBC: 4.19 MIL/uL (ref 3.87–5.11)
RDW: 15.9 % — ABNORMAL HIGH (ref 11.5–15.5)
WBC: 22.5 10*3/uL — ABNORMAL HIGH (ref 4.0–10.5)

## 2017-09-17 LAB — URINALYSIS, ROUTINE W REFLEX MICROSCOPIC
Bilirubin Urine: NEGATIVE
Glucose, UA: NEGATIVE mg/dL
Hgb urine dipstick: NEGATIVE
Ketones, ur: NEGATIVE mg/dL
Leukocytes, UA: NEGATIVE
NITRITE: NEGATIVE
Protein, ur: NEGATIVE mg/dL
Specific Gravity, Urine: 1.017 (ref 1.005–1.030)
pH: 5 (ref 5.0–8.0)

## 2017-09-17 LAB — CBG MONITORING, ED: Glucose-Capillary: 167 mg/dL — ABNORMAL HIGH (ref 65–99)

## 2017-09-17 MED ORDER — VANCOMYCIN HCL 10 G IV SOLR
1500.0000 mg | Freq: Once | INTRAVENOUS | Status: AC
  Administered 2017-09-17: 1500 mg via INTRAVENOUS
  Filled 2017-09-17: qty 1500

## 2017-09-17 MED ORDER — ACETAMINOPHEN 650 MG RE SUPP: 650.0000 mg | Freq: Four times a day (QID) | RECTAL | Status: DC | PRN

## 2017-09-17 MED ORDER — ASPIRIN 325 MG PO TABS: 325.0000 mg | ORAL_TABLET | Freq: Every day | ORAL | Status: DC

## 2017-09-17 MED ORDER — PIPERACILLIN-TAZOBACTAM IN DEX 2-0.25 GM/50ML IV SOLN
2.2500 g | Freq: Three times a day (TID) | INTRAVENOUS | Status: DC
  Administered 2017-09-18 – 2017-09-20 (×7): 2.25 g via INTRAVENOUS
  Filled 2017-09-17 (×9): qty 50

## 2017-09-17 MED ORDER — VANCOMYCIN HCL IN DEXTROSE 1-5 GM/200ML-% IV SOLN
1000.0000 mg | INTRAVENOUS | Status: DC
  Administered 2017-09-19: 1000 mg via INTRAVENOUS
  Filled 2017-09-17: qty 200

## 2017-09-17 MED ORDER — PIPERACILLIN-TAZOBACTAM 3.375 G IVPB 30 MIN
3.3750 g | Freq: Once | INTRAVENOUS | Status: AC
  Administered 2017-09-17: 3.375 g via INTRAVENOUS
  Filled 2017-09-17: qty 50

## 2017-09-17 MED ORDER — ONDANSETRON HCL 4 MG/2ML IJ SOLN: 4.0000 mg | Freq: Four times a day (QID) | INTRAMUSCULAR | Status: DC | PRN

## 2017-09-17 MED ORDER — ENOXAPARIN SODIUM 30 MG/0.3ML ~~LOC~~ SOLN
30.0000 mg | SUBCUTANEOUS | Status: DC
  Administered 2017-09-18 – 2017-09-22 (×5): 30 mg via SUBCUTANEOUS
  Filled 2017-09-17 (×5): qty 0.3

## 2017-09-17 MED ORDER — ENSURE ENLIVE PO LIQD: 120.0000 mL | Freq: Every day | ORAL | Status: DC

## 2017-09-17 MED ORDER — ONDANSETRON HCL 4 MG PO TABS: 4.0000 mg | ORAL_TABLET | Freq: Four times a day (QID) | ORAL | Status: DC | PRN

## 2017-09-17 MED ORDER — ACETAMINOPHEN 325 MG PO TABS
650.0000 mg | ORAL_TABLET | Freq: Four times a day (QID) | ORAL | Status: DC | PRN
  Administered 2017-09-19: 650 mg via ORAL
  Filled 2017-09-17: qty 2

## 2017-09-17 MED ORDER — SODIUM CHLORIDE 0.9 % IV BOLUS (SEPSIS): 1000.0000 mL | Freq: Once | INTRAVENOUS | Status: DC

## 2017-09-17 MED ORDER — DEXTROSE 5 % AND 0.45 % NACL IV BOLUS
1000.0000 mL | Freq: Once | INTRAVENOUS | Status: AC
  Administered 2017-09-17: 1000 mL via INTRAVENOUS

## 2017-09-17 MED ORDER — SENNOSIDES-DOCUSATE SODIUM 8.6-50 MG PO TABS: 2.0000 | ORAL_TABLET | Freq: Every day | ORAL | Status: DC | PRN

## 2017-09-17 MED ORDER — DEXTROSE-NACL 5-0.45 % IV SOLN
INTRAVENOUS | Status: DC
  Administered 2017-09-18 – 2017-09-20 (×6): via INTRAVENOUS

## 2017-09-17 NOTE — ED Notes (Signed)
Cup of water provided to patient.

## 2017-09-17 NOTE — H&P (Signed)
History and Physical    Natalie Goodman UYQ:[id number] DOB: [birth year] DOA: 09/17/2017  Referring MD/NP/PA:   PCP: Etta Grandchild, MD   Patient coming from:  The patient is coming from SNF.  At baseline, pt is dependent for most of ADL.   Chief Complaint: AMS  HPI: Natalie Goodman is a 84 y.o. female with medical history significant of hypertension, hyperlipidemia, GERD, CAD, dementia, CHF, CK-3, sacral ulcer, who presents with altered mental status.  Patient has AMS and dementia, and is unable to provide accurate medical history, therefore, most of the history is obtained by discussing the case with ED physician, per EMS report, and with the nursing staff.  Per EDP, pt is here with family who also does not know why she is here.  EMS was told by staff at the nursing home that she was sent here for an IV. Pt is obviousely very confused. She complains of pain all over, particularly in the sacral area where she has pressure ulcer. She also has a skin lesion in the right calf area without active draining. No acute respiratory distress, cough, nausea, vomiting, diarrhea noted. He moves all extremities.  ED Course: pt was found to have WBC 22.5, lactic acid 1.99, negative urinalysis, worsening renal function, sodium 152, hyperthermia with temperature 96.7, soft blood pressure, oxygen sat 96% on room air, negative chest x-ray. Patient is admitted to stepdown as inpatient.  Review of Systems: Could not be reviewed due to dementia and altered mental status.   Allergy:  Allergies  Allergen Reactions  . Codeine Nausea And Vomiting    Past Medical History:  Diagnosis Date  . Anemia   . CAD (coronary artery disease)    a. per patient report, unable to recall any cardiac catheterizations being performed or stents placed  b. NST in 2013 showing inferior and lateral wall scar with mild ischemia --> treated medically  . ESOPHAGEAL STRICTURE   . GERD   . Hyperlipidemia   . Hypertension   . KIDNEY  DISEASE, CHRONIC, STAGE I   . MYOCARDIAL INFARCTION, HX OF   . OSTEOARTHRITIS   . PARESTHESIA     Past Surgical History:  Procedure Laterality Date  . ABDOMINAL HYSTERECTOMY    . REVISION TOTAL KNEE ARTHROPLASTY     Bilateral  . TONSILLECTOMY      Social History:  reports that  has never smoked. she has never used smokeless tobacco. She reports that she does not drink alcohol or use drugs.  Family History:  Family History  Problem Relation Age of Onset  . Hypertension Mother   . Heart disease Mother        CHF  . Hypertension Father   . Alcohol abuse Neg Hx   . Cancer Neg Hx   . Stroke Neg Hx      Prior to Admission medications   Medication Sig Start Date End Date Taking? Authorizing Provider  aspirin 325 MG tablet Take 325 mg by mouth daily.    [provider]  NUTRITIONAL SUPPLEMENT LIQD Take 120 mLs by mouth daily. MedPass    [provider]  sennosides-docusate sodium (SENOKOT-S) 8.6-50 MG tablet Take 2 tablets by mouth at bedtime.    [provider]    Physical Exam: Vitals:   09/18/17 0330 09/18/17 0445 09/18/17 0500 09/18/17 0530  BP: (!) 111/43  113/71 (!) 106/47  Pulse: 92 85  87  Resp:      Temp:      TempSrc:  SpO2: 100% 100%  100%   General: Not in acute distress HEENT:       Eyes: PERRL, EOMI, no scleral icterus.       ENT: No discharge from the ears and nose, no pharynx injection, no tonsillar enlargement.        Neck: No JVD, no bruit, no mass felt. Heme: No neck lymph node enlargement. Cardiac: S1/S2, RRR, No murmurs, No gallops or rubs. Respiratory:  No rales, wheezing, rhonchi or rubs. GI: Soft, nondistended, nontender, no organomegaly, BS present. GU: No hematuria Ext: No pitting leg edema bilaterally. 2+DP/PT pulse bilaterally. Musculoskeletal: No joint deformities, No joint redness or warmth, no limitation of ROM in spin. Skin: has stage III sacral ulcer, and a skin lesion in the right calf area without  active draining.  Neuro: confused, knows her own name, not oriented to place and time, cranial nerves II-XII grossly intact, moves all extremities. Psych: Patient is not psychotic, no suicidal or hemocidal ideation.  Labs on Admission: I have personally reviewed following labs and imaging studies  CBC: Recent Labs  Lab 09/16/17 09/17/17 2051 09/18/17 0200  WBC 14.6 22.5* 19.1*  NEUTROABS 12 19.0*  --   HGB 12.8 13.2 11.9*  HCT 41 40.4 36.8  MCV  --  96.4 95.6  PLT 321 343 286   Basic Metabolic Panel: Recent Labs  Lab 09/16/17 09/17/17 09/17/17 2051 09/18/17 0127 09/18/17 0200  NA 152* 153* 152* 148* 147*  K 4.8 4.5 4.2 3.8 4.1  CL  --   --  110 114* 112*  CO2  --   --  23 21* 21*  GLUCOSE  --   --  113* 117* 106*  BUN 45* 61* 70* 66* 67*  CREATININE 1.5* 2.4* 2.84* 2.46* 2.49*  CALCIUM  --   --  10.8* 9.7 9.8   GFR: Estimated Creatinine Clearance: 15.2 mL/min (A) (by C-G formula based on SCr of 2.49 mg/dL (H)). Liver Function Tests: Recent Labs  Lab 09/17/17 2051  AST 33  ALT 21  ALKPHOS 107  BILITOT 1.4*  PROT 8.9*  ALBUMIN 2.7*   No results for input(s): LIPASE, AMYLASE in the last 168 hours. No results for input(s): AMMONIA in the last 168 hours. Coagulation Profile: No results for input(s): INR, PROTIME in the last 168 hours. Cardiac Enzymes: No results for input(s): CKTOTAL, CKMB, CKMBINDEX, TROPONINI in the last 168 hours. BNP (last 3 results) No results for input(s): PROBNP in the last 8760 hours. HbA1C: No results for input(s): HGBA1C in the last 72 hours. CBG: Recent Labs  Lab 09/17/17 2104  GLUCAP 167*   Lipid Profile: No results for input(s): CHOL, HDL, LDLCALC, TRIG, CHOLHDL, LDLDIRECT in the last 72 hours. Thyroid Function Tests: Recent Labs    09/18/17 0200  TSH 2.857   Anemia Panel: No results for input(s): VITAMINB12, FOLATE, FERRITIN, TIBC, IRON, RETICCTPCT in the last 72 hours. Urine analysis:    Component Value Date/Time    COLORURINE AMBER (A) 09/17/2017 2214   APPEARANCEUR HAZY (A) 09/17/2017 2214   LABSPEC 1.017 09/17/2017 2214   PHURINE 5.0 09/17/2017 2214   GLUCOSEU NEGATIVE 09/17/2017 2214   GLUCOSEU NEGATIVE 11/29/2015 1108   HGBUR NEGATIVE 09/17/2017 2214   BILIRUBINUR NEGATIVE 09/17/2017 2214   KETONESUR NEGATIVE 09/17/2017 2214   PROTEINUR NEGATIVE 09/17/2017 2214   UROBILINOGEN 0.2 11/29/2015 1108   NITRITE NEGATIVE 09/17/2017 2214   LEUKOCYTESUR NEGATIVE 09/17/2017 2214   Sepsis Labs: @LABRCNTIP (procalcitonin:4,lacticidven:4) )No results found for this or any previous  visit (from the past 240 hour(s)).   Radiological Exams on Admission: Dg Chest 2 View  Result Date: 09/17/2017 CLINICAL DATA:  Chest pain EXAM: CHEST - 2 VIEW COMPARISON:  None. FINDINGS: The heart size and mediastinal contours are within normal limits. Both lungs are clear. The visualized skeletal structures are unremarkable. IMPRESSION: No active cardiopulmonary disease. Electronically Signed   By: Deatra Robinson M.D.   On: 09/17/2017 18:45     EKG: Independently reviewed.  Sinus rhythm, QTC 466, early R-wave progression, nonspecific T-wave change.    Assessment/Plan Principal Problem:   Sepsis (HCC) Active Problems:   Essential hypertension   Coronary atherosclerosis   Chronic diastolic CHF (congestive heart failure) (HCC)   Hypernatremia   Acute metabolic encephalopathy   Hypercalcemia   Acute renal failure superimposed on stage 3 chronic kidney disease (HCC)   Pressure ulcer   Sepsis [facility]): Patient meets criteria for sepsis with leukocytosis, hypothermia and tachypnea. Lactic acid is normal. The source of infection is not clear. Urinalysis negative. Chest x-rays negative. Patient has a sacral ulcer which does not have active drainage. Patient also has a skin lesion in R calf area, which dose also not seem to have active draining.   -will admit to SUD due to need warm blanket -vanco and zosyn started  empirically -f/u Bx -will get Procalcitonin and trend lactic acid levels per sepsis protocol. -IVF: 1L of D5-1/2 NS bolus in ED, followed by 75 cc/h  - check ERS and CRP  Hypernatremia: Sodium 152. Has altered mental status. Likely due to dehydration. -1L of D5-1/2 NS bolus in ED, followed by 75 cc/h  -f/u by BMP   Acute metabolic encephalopathy: likely due to sepsis and hypernatremia -treat sepsis and correction of hyponatremia as above -Frequent neuro check -Hold oral medications onto mental status improves.  Essential hypertension: not taking meds at home. Bp soft -monitoring Bp closely  Coronary atherosclerosis: No CP -ASA per rectal  Chronic diastolic congestive heart failure (HCC): 2-D echo on 08/30/17 showed EF 65-70% with grade 1 diastolic dysfunction. Patient does not have leg edema. clinically patient is dry. Patient is not taking diuretics at home. -check  BNP.  Hypercalcemia: Ca 10.8, likely due to dehydration. -IVF fluid as above  Acute renal failure superimposed on stage 3 chronic kidney disease (HCC): Baseline creatinine 1.0-1.3. Her creatinine is a 2.84, BUN 70. Likely due to prerenal secondary to dehydration. - IVF as above - Follow up renal function by BMP  Pressure ulcer: sacral stage III and R calf skin lesion -wound care consult   DVT ppx:  SQ Lovenox Code Status: Full code Family Communication: None at bed side. Disposition Plan:  Anticipate discharge back to previous SNF Consults called:  none Admission status: SDU/inpation       Date of Service 09/18/2017    Lorretta Harp [facility] Pager [phone]  If 7PM-7AM, please contact night-coverage [email or web address] Password [facility] 09/18/2017, 6:39 AM

## 2017-09-17 NOTE — Progress Notes (Addendum)
Location:  [place] Room Number: 107-A Place of Service:  SNF (31) Provider:  Durenda Age, NP  Patient Care Team: Natalie Lima, MD as PCP - General  Extended Emergency Contact Information Primary Emergency Contact: Locklear,Ruby Address: [address], [place] Home Phone: [phone] Relation: Other  Code Status:  Full Code  Goals of care: Advanced Directive information Advanced Directives 08/29/2017  Does Patient Have a Medical Advance Directive? No  Type of Advance Directive -  Does patient want to make changes to medical advance directive? -  Copy of [place] in Chart? -  Would patient like information on creating a medical advance directive? No - Patient declined     Chief Complaint  Patient presents with  . Acute Visit    Hypernatremia and leukocytosis demonstrated on labs    HPI:  Pt is a 84 y.o. female seen today for an acute visit secondary to hypernatremia (sodium 152) and leukocytosis (WBC 14.6) demonstrated on labs.  She is a short-term rehabilitation resident at [facility] [facility].  She has a PMH of hypokalemia, acute diastolic CHF, CAD, GERD, stage 1 CKD, MI, and hypernatremia. She was seen in the room today. She was asking for lemonade. CNA reported that she didn't eat breakfast. Noted tongue to have thick whitish coating. Latest Na 152, glucose 107, BUN 45.3  Creatinine 1.53, K 4.8  eGFR 34.27, wbc 14.6, hgb 12.8. No reported fever. D5 1/2NS IV was ordered for hydration. Three IV insertin was attempted unsuccessfully. Patient was anxious and pulling her arms away during the insertion. She said that she wanted to die and to leave her alone. Nurse super visor said that she'll call non emergent PTAR for IV insertion.    Past Medical History:  Diagnosis Date  . Anemia   . CAD (coronary artery disease)    a. per patient report, unable to recall any cardiac catheterizations being  performed or stents placed  b. NST in 2013 showing inferior and lateral wall scar with mild ischemia --> treated medically  . ESOPHAGEAL STRICTURE   . GERD   . Hyperlipidemia   . Hypertension   . KIDNEY DISEASE, CHRONIC, STAGE I   . MYOCARDIAL INFARCTION, HX OF   . OSTEOARTHRITIS   . PARESTHESIA    Past Surgical History:  Procedure Laterality Date  . ABDOMINAL HYSTERECTOMY    . REVISION TOTAL KNEE ARTHROPLASTY     Bilateral  . TONSILLECTOMY      Allergies  Allergen Reactions  . Codeine Nausea And Vomiting    Outpatient Encounter Medications as of 09/17/2017  Medication Sig  . aspirin 325 MG tablet Take 325 mg by mouth daily.  Marland Kitchen NUTRITIONAL SUPPLEMENT LIQD Take 120 mLs by mouth daily. MedPass  . sennosides-docusate sodium (SENOKOT-S) 8.6-50 MG tablet Take 2 tablets by mouth at bedtime.  . [DISCONTINUED] furosemide (LASIX) 20 MG tablet Take 1 tablet (20 mg total) by mouth daily.  . [DISCONTINUED] spironolactone (ALDACTONE) 25 MG tablet Take 1 tablet (25 mg total) by mouth daily.   No facility-administered encounter medications on file as of 09/17/2017.     Review of Systems  GENERAL: poor appetite MOUTH and THROAT: Denies oral discomfort, gingival pain or bleeding RESPIRATORY: no cough, SOB, DOE, wheezing, hemoptysis CARDIAC: No chest pain, edema or palpitations GI: No abdominal pain, diarrhea, constipation, heart burn, nausea or vomiting PSYCHIATRIC: Denies feelings of depression or anxiety. No report of hallucinations, insomnia,  paranoia, or agitation    Immunization History  Administered Date(s) Administered  . Influenza Whole 05/10/2011  . Influenza,inj,Quad PF,6+ Mos 03/10/2013, 03/24/2014  . Pneumococcal Conjugate-13 03/10/2013  . Pneumococcal Polysaccharide-23 11/29/2015  . Td 07/16/1999, 06/12/2010   Pertinent  Health Maintenance Due  Topic Date Due  . INFLUENZA VACCINE  10/13/2017 (Originally 02/12/2017)  . DEXA SCAN  Completed  . PNA vac Low Risk Adult   Completed   Fall Risk  11/29/2015 11/29/2015 03/27/2014 06/07/2013  Falls in the past year? Yes Yes No No  Number falls in past yr: 1 1 - -  Injury with Fall? No No - -  Risk for fall due to : Other (Comment) - - -  Follow up Education provided Falls evaluation completed - -      Vitals:   09/17/17 1428 09/17/17 1436  BP: (!) 113/53 94/80  Pulse: 88   Resp: 16   Temp: (!) 97.3 F (36.3 C)   TempSrc: Oral   SpO2: 96%   Weight: 164 lb 12.8 oz (74.8 kg)   Height: '5\' 5"'$  (1.651 m)    Body mass index is 27.42 kg/m.  Physical Exam  GENERAL APPEARANCE: Well nourished. In no acute distress. Normal body habitus SKIN:  Left lower leg venous ulcer with dressing, Left buttock pressure ulcer, unstageable with dressing OUTH and THROAT: Lips are without lesions. Oral mucosa is moist and without lesions. Tongue has thick whitish coating RESPIRATORY: Breathing is even & unlabored, BS CTAB CARDIAC: RRR, no murmur,no extra heart sounds, no edema GI: Abdomen soft, normal BS, no masses, no tenderness EXTREMITIES:  Able to move X 4 extremities., BLE 1-2+ edema PSYCHIATRIC: Alert to self, disoriented to time and place. Affect and behavior are appropriate   Labs reviewed: Recent Labs    09/01/17 0813 09/02/17 0417 09/03/17 0430 09/08/17  NA 146* 144 144 147  K 4.3 4.0 4.6 4.4  CL 96* 98* 99*  --   CO2 34* 32 32  --   GLUCOSE 103* 90 87  --   BUN 17 21* 37* 48*  CREATININE 1.35* 1.09* 1.28* 1.2*  CALCIUM 10.4* 10.3 10.2  --    Recent Labs    08/19/17 1821 08/29/17 1526  AST 40 48*  ALT 24 30  ALKPHOS 67 61  BILITOT 0.9 1.8*  PROT 7.0 6.8  ALBUMIN 3.0* 3.1*   Recent Labs    08/19/17 1821 08/29/17 1526 08/29/17 2104 09/08/17  WBC 6.2 6.6 8.0 9.6  NEUTROABS 3.7  --   --  7  HGB 11.2* 11.6* 12.4 12.3  HCT 35.5* 35.5* 36.9 37  MCV 93.4 93.4 92.5  --   PLT 136* 139* 149* 213   Lab Results  Component Value Date   TSH 4.94 (H) 11/29/2015   Lab Results  Component Value Date    HGBA1C 4.7 09/10/2017   Lab Results  Component Value Date   CHOL 134 11/29/2015   HDL 43.40 11/29/2015   LDLCALC 74 11/29/2015   LDLDIRECT 144.3 12/04/2011   TRIG 86.0 11/29/2015   CHOLHDL 3 11/29/2015    Significant Diagnostic Results in last 30 days:  Dg Chest 2 View  Result Date: 08/29/2017 CLINICAL DATA:  Difficulty walking EXAM: CHEST  2 VIEW COMPARISON:  08/19/2017 FINDINGS: Cardiac shadow is enlarged. Aortic calcifications are again seen. The lungs are well aerated bilaterally. Small bilateral pleural effusions are noted. Chronic appearing changes in the left retrocardiac region are noted. IMPRESSION: Small bilateral pleural effusions. Electronically Signed  By: Inez Catalina M.D.   On: 08/29/2017 16:52   Dg Chest 2 View  Result Date: 08/19/2017 CLINICAL DATA:  Pt c/o leg swelling and SOB x 1 week. Hx of CAD, HTN, AND MI. Pt is a nonsmoker. EXAM: CHEST  2 VIEW COMPARISON:  Chest CT, 11/08/2014.  Chest radiographs, 10/29/2013. FINDINGS: Cardiopericardial silhouette is mildly enlarged. No mediastinal or hilar masses. No convincing adenopathy. There are prominent bronchovascular markings. This is most evident in the left lower lobe at the left lung base. This is similar to the prior chest CT, consistent with chronic bronchitic change. There is no convincing pneumonia and no evidence of pulmonary edema. No pleural effusion or pneumothorax. Skeletal structures are demineralized but grossly intact. IMPRESSION: 1. No acute cardiopulmonary disease. 2. Mild cardiomegaly. Electronically Signed   By: Lajean Manes M.D.   On: 08/19/2017 18:12    Assessment/Plan  1. Hypernatremia - will start D5 1/2 NS 70 ml/hr X 1L,then repeat BMP , will call non-emergent PTAR   2. Acute renal failure superimposed on stage 3 chronic kidney disease, unspecified acute renal failure type (HCC) - creatinine 1.53, will monitor   3. Oral candida - will start Nystatin 100,000 units/ml swab 5 ml to tonge QID X 2w  eeks, oral care daily   4. Hypotension, unspecified hypotension type - latest BP 94/80, probably due to poor PO intake, will start IV hydration  5. Anxiety - give Ativan 0.5 mg 1 tab PO X 1, Psych consult  6. Leukocytosis - no reported fever, ordered chest x-ray and urinalysis with culture and sensitivity     Durenda Age, NP [place] [phone] (Monday-Friday 8:00 a.m. - 5:00 p.m.) [phone] (after hours)

## 2017-09-17 NOTE — ED Triage Notes (Signed)
Patient [facility] to ED [facility]. EMS reports:  EMS called by staff with reports that patient needs an IV and chest xray. Facility staff unable to start IV. EMS unsuccessful. Patient does not have IV. Staff unable to state reason patient needs IV or chest xray. EMS unable to attain information as to patient's condition.  Patient answers some questions appropriately, and other questions she seems confused and agitated.  NSR on monitor. BP 126/70, Pulse 90, 98% on room air. CBG 115

## 2017-09-17 NOTE — ED Notes (Signed)
IV team at bedside 

## 2017-09-17 NOTE — ED Notes (Signed)
Attempted to gain ISTAT. Unsuccessful.

## 2017-09-17 NOTE — Progress Notes (Signed)
Pharmacy Antibiotic Note  Natalie Goodman is a 84 y.o. female admitted on 09/17/2017 with wound infection.  Pharmacy has been consulted for vancomycin and zosyn dosing. WBC 22.5, SCr 2.4 (BL ~ 1.2). CrCl ~ 15 - 20 mL/min. Patient has a dose of Zosyn 3.375 gm ordered in the ED.   Plan: -Vancomycin 1500 mg IV once, then vancomycin 1 gm IV Q 48 hours  -Zosyn 2.25 gm IV Q 8 hours  -Monitor CBC, renal fx, cultures and clinical progress -VT at SS   Temp (24hrs), Avg:97 F (36.1 C), Min:96.7 F (35.9 C), Max:97.3 F (36.3 C)  Recent Labs  Lab 09/16/17 09/17/17  WBC 14.6  --   CREATININE 1.5* 2.4*    Estimated Creatinine Clearance: 15.8 mL/min (A) (by C-G formula based on SCr of 2.4 mg/dL (A)).    Allergies  Allergen Reactions  . Codeine Nausea And Vomiting    Antimicrobials this admission: Vanc 3/6 >>  Zosyn 3/6 >>   Dose adjustments this admission: None  Microbiology results:   Thank you for allowing pharmacy to be a part of this patient's care.  Vinnie LevelBenjamin Deondrea Markos, PharmD., BCPS Clinical Pharmacist Clinical phone for 09/17/17 until 11pm: [phone] If after 11pm, please call main pharmacy at: [phone]

## 2017-09-17 NOTE — ED Provider Notes (Signed)
[facility] Provider Note   CSN: [id number] Arrival date & time: 09/17/17  1732     History   Chief Complaint Chief Complaint  Patient presents with  . Altered Mental Status    HPI Natalie Goodman is a [age] y.o. female.  HPI   Patient is a 84 year old female presenting with no complaints.  She is here with family who also does not know why she is here.  EMS was told by staff at the nursing home that she was sent here for an IV.  Patient's past medical history significant for CHF, UTI, hypokalemia, hypernatremia chronic wounds on bilateral lower extremities, CAD and dementia.  She was discharged home less than 2 weeks ago.  Sounds like she did labs at her outpatient facility and they were noted to still be hyponatremic.  The plan is to give 1 L of D5 half-normal saline.  However known was able to get an IV placed so she came here for IV placement and to receive that bolus of fluids.  Past Medical History:  Diagnosis Date  . Anemia   . CAD (coronary artery disease)    a. per patient report, unable to recall any cardiac catheterizations being performed or stents placed  b. NST in 2013 showing inferior and lateral wall scar with mild ischemia --> treated medically  . ESOPHAGEAL STRICTURE   . GERD   . Hyperlipidemia   . Hypertension   . KIDNEY DISEASE, CHRONIC, STAGE I   . MYOCARDIAL INFARCTION, HX OF   . OSTEOARTHRITIS   . PARESTHESIA     Patient Active Problem List   Diagnosis Date Noted  . Acute diastolic CHF (congestive heart failure) (HCC) 08/30/2017  . Acute lower UTI 08/30/2017  . Hypernatremia 08/30/2017  . Chronic systolic congestive heart failure (HCC) 08/26/2017  . TSH elevation 08/25/2017  . Hypokalemia 08/25/2017  . Primary osteoarthritis of both knees 10/21/2016  . Deficiency anemia 11/29/2015  . Routine general medical examination at a health care facility 03/27/2014  . Eczema, dyshidrotic 04/01/2013  . Obesity (BMI  35.0-39.9 without comorbidity) 03/10/2013  . ESOPHAGEAL STRICTURE 06/27/2010  . GERD 06/12/2010  . KIDNEY DISEASE, CHRONIC, STAGE I 03/15/2010  . Hyperlipidemia with target LDL less than 160 03/01/2010  . Essential hypertension 03/01/2010  . Coronary atherosclerosis 03/01/2010  . PARESTHESIA 03/01/2010    Past Surgical History:  Procedure Laterality Date  . ABDOMINAL HYSTERECTOMY    . REVISION TOTAL KNEE ARTHROPLASTY     Bilateral  . TONSILLECTOMY      OB History    No data available       Home Medications    Prior to Admission medications   Medication Sig Start Date End Date Taking? Authorizing Provider  aspirin 325 MG tablet Take 325 mg by mouth daily.    [provider]  NUTRITIONAL SUPPLEMENT LIQD Take 120 mLs by mouth daily. MedPass    [provider]  sennosides-docusate sodium (SENOKOT-S) 8.6-50 MG tablet Take 2 tablets by mouth at bedtime.    [provider]    Family History Family History  Problem Relation Age of Onset  . Hypertension Mother   . Heart disease Mother        CHF  . Hypertension Father   . Alcohol abuse Neg Hx   . Cancer Neg Hx   . Stroke Neg Hx     Social History Social History   Tobacco Use  . Smoking status: Never Smoker  .  Smokeless tobacco: Never Used  Substance Use Topics  . Alcohol use: No  . Drug use: No     Allergies   Codeine   Review of Systems Review of Systems  Unable to perform ROS: Dementia  Constitutional: Negative for activity change.  Respiratory: Negative for shortness of breath.   Cardiovascular: Negative for chest pain.  Gastrointestinal: Negative for abdominal pain.     Physical Exam Updated Vital Signs BP (!) 122/52   Resp (!) 23   Physical Exam  Constitutional: She appears well-developed and well-nourished.  HENT:  Head: Normocephalic and atraumatic.  Eyes: Right eye exhibits no discharge. Left eye exhibits no discharge.  Dry mucous membranes.  Cardiovascular:  Normal rate, regular rhythm and normal heart sounds.  No murmur heard. Pulmonary/Chest: Effort normal and breath sounds normal. She has no wheezes. She has no rales.  Abdominal: Soft. She exhibits no distension.  Musculoskeletal:  Wound right lower extremity.  Backside with multiple deep ulcers/surrounding puss  Neurological:  Oriented to self.  Skin: Skin is warm and dry. She is not diaphoretic.  Nursing note and vitals reviewed.    ED Treatments / Results  Labs (all labs ordered are listed, but only abnormal results are displayed) Labs Reviewed  COMPREHENSIVE METABOLIC PANEL  CBC WITH DIFFERENTIAL/PLATELET    EKG  EKG Interpretation  Date/Time:  Thursday September 18 2017 00:09:56 EST Ventricular Rate:  92 PR Interval:    QRS Duration: 109 QT Interval:  376 QTC Calculation: 466 R Axis:   34 Text Interpretation:  Sinus rhythm Borderline T abnormalities, inferior leads Confirmed by Geoffery Lyons ([id number]) on 09/18/2017 1:06:06 AM Also confirmed by Geoffery Lyons ([id number]), editor Elita Quick ([id number])  on 09/18/2017 7:34:55 AM       Radiology No results found.  Procedures Procedures (including critical care time)  CRITICAL CARE Performed by: Arlana Hove Total critical care time: 35 minutes Critical care time was exclusive of separately billable procedures and treating other patients. Critical care was necessary to treat or prevent imminent or life-threatening deterioration. Critical care was time spent personally by me on the following activities: development of treatment plan with patient and/or surrogate as well as nursing, discussions with consultants, evaluation of patient's response to treatment, examination of patient, obtaining history from patient or surrogate, ordering and performing treatments and interventions, ordering and review of laboratory studies, ordering and review of radiographic studies, pulse oximetry and re-evaluation of patient's  condition.   Medications Ordered in ED Medications  sodium chloride 0.9 % bolus 1,000 mL (not administered)     Initial Impression / Assessment and Plan / ED Course  I have reviewed the triage vital signs and the nursing notes.  Pertinent labs & imaging results that were available during my care of the patient were reviewed by me and considered in my medical decision making (see chart for details).     Patient is a 84 year old female presenting with no complaints.  She is here with family who also does not know why she is here.  EMS was told by staff at the nursing home that she was sent here for an IV.  Patient's past medical history significant for CHF, UTI, hypokalemia, hypernatremia chronic wounds on bilateral lower extremities, CAD and dementia.  She was discharged home less than 2 weeks ago.  Sounds like she did labs at her outpatient facility and they were noted to still be hyponatremic.  The plan is to give 1 L of D5 half-normal saline.  However known  was able to get an IV placed so she came here for IV placement and to receive that bolus of fluids.  6:24 PM Patient here with acute complaint of inability to get IV.  Will get an IV and give the fluids that was ordered by patient's primary care physician at her [facility].  Do not feel the need to do additional workup at this time given that patient had no complaints except could not place IV.  9:06 PM Rolled patient with nurse and she is pretty impressively infected wounds on her backside.  Very malodorous.  I think this is likely the source of her infection.  Patient increasingly altered.  Will need to admit for hypernatremia, infection.  Final Clinical Impressions(s) / ED Diagnoses   Final diagnoses:  None    ED Discharge Orders    None       Abelino Derrick, MD 09/18/17 2337

## 2017-09-17 NOTE — ED Notes (Signed)
Kathyrn Lasshristine Brown, [phone]

## 2017-09-18 DIAGNOSIS — L8990 Pressure ulcer of unspecified site, unspecified stage: Secondary | ICD-10-CM

## 2017-09-18 DIAGNOSIS — N183 Chronic kidney disease, stage 3 unspecified: Secondary | ICD-10-CM

## 2017-09-18 DIAGNOSIS — N179 Acute kidney failure, unspecified: Secondary | ICD-10-CM

## 2017-09-18 DIAGNOSIS — R627 Adult failure to thrive: Secondary | ICD-10-CM

## 2017-09-18 LAB — CREATININE, URINE, RANDOM: Creatinine, Urine: 165.25 mg/dL

## 2017-09-18 LAB — LACTIC ACID, PLASMA
Lactic Acid, Venous: 1.9 mmol/L (ref 0.5–1.9)
Lactic Acid, Venous: 1.3 mmol/L (ref 0.5–1.9)

## 2017-09-18 LAB — BASIC METABOLIC PANEL
Anion gap: 13 (ref 5–15)
Anion gap: 14 (ref 5–15)
BUN: 66 mg/dL — AB (ref 6–20)
BUN: 67 mg/dL — ABNORMAL HIGH (ref 6–20)
CHLORIDE: 114 mmol/L — AB (ref 101–111)
CHLORIDE: 112 mmol/L — AB (ref 101–111)
CO2: 21 mmol/L — AB (ref 22–32)
CO2: 21 mmol/L — AB (ref 22–32)
CREATININE: 2.49 mg/dL — AB (ref 0.44–1.00)
Calcium: 9.7 mg/dL (ref 8.9–10.3)
Calcium: 9.8 mg/dL (ref 8.9–10.3)
Creatinine, Ser: 2.46 mg/dL — ABNORMAL HIGH (ref 0.44–1.00)
GFR calc Af Amer: 19 mL/min — ABNORMAL LOW (ref >60)
GFR calc non Af Amer: 16 mL/min — ABNORMAL LOW (ref >60)
GFR, EST AFRICAN AMERICAN: 19 mL/min — AB (ref >60)
GFR, EST NON AFRICAN AMERICAN: 16 mL/min — AB (ref >60)
GLUCOSE: 117 mg/dL — AB (ref 65–99)
Glucose, Bld: 106 mg/dL — ABNORMAL HIGH (ref 65–99)
POTASSIUM: 3.8 mmol/L (ref 3.5–5.1)
POTASSIUM: 4.1 mmol/L (ref 3.5–5.1)
Sodium: 148 mmol/L — ABNORMAL HIGH (ref 135–145)
Sodium: 147 mmol/L — ABNORMAL HIGH (ref 135–145)

## 2017-09-18 LAB — CBC
HEMATOCRIT: 36.8 % (ref 36.0–46.0)
Hemoglobin: 11.9 g/dL — ABNORMAL LOW (ref 12.0–15.0)
MCH: 30.9 pg (ref 26.0–34.0)
MCHC: 32.3 g/dL (ref 30.0–36.0)
MCV: 95.6 fL (ref 78.0–100.0)
PLATELETS: 286 10*3/uL (ref 150–400)
RBC: 3.85 MIL/uL — AB (ref 3.87–5.11)
RDW: 15.9 % — ABNORMAL HIGH (ref 11.5–15.5)
WBC: 19.1 10*3/uL — ABNORMAL HIGH (ref 4.0–10.5)

## 2017-09-18 LAB — TSH: TSH: 2.857 u[IU]/mL (ref 0.350–4.500)

## 2017-09-18 LAB — BRAIN NATRIURETIC PEPTIDE: B NATRIURETIC PEPTIDE 5: 133.6 pg/mL — AB (ref 0.0–100.0)

## 2017-09-18 LAB — SODIUM, URINE, RANDOM: Sodium, Ur: 30 mmol/L

## 2017-09-18 LAB — PROCALCITONIN: PROCALCITONIN: 1.26 ng/mL

## 2017-09-18 LAB — CBG MONITORING, ED: Glucose-Capillary: 113 mg/dL — ABNORMAL HIGH (ref 65–99)

## 2017-09-18 LAB — SEDIMENTATION RATE: Sed Rate: 83 mm/hr — ABNORMAL HIGH (ref 0–22)

## 2017-09-18 MED ORDER — ASPIRIN 300 MG RE SUPP
300.0000 mg | Freq: Every day | RECTAL | Status: DC
  Administered 2017-09-18 – 2017-09-22 (×4): 300 mg via RECTAL
  Filled 2017-09-18 (×4): qty 1

## 2017-09-18 MED ORDER — MORPHINE SULFATE (PF) 4 MG/ML IV SOLN
1.0000 mg | INTRAVENOUS | Status: DC | PRN
  Administered 2017-09-18 – 2017-09-20 (×3): 1 mg via INTRAVENOUS
  Filled 2017-09-18 (×4): qty 1

## 2017-09-18 MED ORDER — BISACODYL 10 MG RE SUPP: 10.0000 mg | RECTAL | Status: DC | PRN

## 2017-09-18 MED ORDER — FLEET ENEMA 7-19 GM/118ML RE ENEM: 1.0000 | ENEMA | RECTAL | Status: DC | PRN

## 2017-09-18 MED ORDER — MAGNESIUM HYDROXIDE 400 MG/5ML PO SUSP: 30.0000 mL | Freq: Every day | ORAL | Status: DC | PRN

## 2017-09-18 MED ORDER — COLLAGENASE 250 UNIT/GM EX OINT
TOPICAL_OINTMENT | Freq: Every day | CUTANEOUS | Status: DC
  Administered 2017-09-18 – 2017-09-21 (×3): via TOPICAL
  Filled 2017-09-18: qty 30

## 2017-09-18 MED ORDER — SILVER NITRATE-POT NITRATE 75-25 % EX MISC
1.0000 | Freq: Once | CUTANEOUS | Status: AC
  Administered 2017-09-18: 1 via TOPICAL
  Filled 2017-09-18 (×2): qty 1

## 2017-09-18 NOTE — ED Notes (Signed)
Patient turned to right side.

## 2017-09-18 NOTE — Progress Notes (Signed)
Patient received in bed. Alert and response to verbal command. Turned and repositioned. Dressing intact.

## 2017-09-18 NOTE — Consult Note (Addendum)
Reason for Consult: Sacral decubitus/altered mental status Referring Physician: Dr. Murray Hodgkins  Natalie Goodman is an 84 y.o. female.  HPI: Patient is a 84 year old female currently residing in a skilled nursing facility.  Patient's mentation was noted to be somewhat off and labs were obtained.  Showed a sodium of 152, glucose of 107, BUN of 45.3, creatinine 1.53, potassium of 4.8.  WBC is 14.6 hemoglobin 12.8.  They were unable to insert an IV for IV hydration in the facility and she was transported to the emergency department at [facility].  In the ED while evaluating her she was found to have multiple stage III pressure ulcers of the buttocks and to the right lower leg.  She was started on IV Zosyn and IV vancomycin.  She was admitted by medicine with hyponatremia and a sodium of 401, acute metabolic encephalopathy, CAD, acute on chronic renal failure, and pressure ulcers to her leg and sacrum.  She has a history of hypertension, hyperlipidemia, GERD, CAD, dementia, congestive heart failure, stage III kidney disease, and a sacral ulcer.  We are asked to see and help with evaluation and treatment of her ulcers.  Past Medical History:  Diagnosis Date  . Anemia   . CAD (coronary artery disease)    a. per patient report, unable to recall any cardiac catheterizations being performed or stents placed  b. NST in 2013 showing inferior and lateral wall scar with mild ischemia --> treated medically  . ESOPHAGEAL STRICTURE   . GERD   . Hyperlipidemia   . Hypertension   . KIDNEY DISEASE, CHRONIC, STAGE I   . MYOCARDIAL INFARCTION, HX OF   . OSTEOARTHRITIS   . PARESTHESIA     Past Surgical History:  Procedure Laterality Date  . ABDOMINAL HYSTERECTOMY    . REVISION TOTAL KNEE ARTHROPLASTY     Bilateral  . TONSILLECTOMY      Family History  Problem Relation Age of Onset  . Hypertension Mother   . Heart disease Mother        CHF  . Hypertension Father   . Alcohol abuse Neg Hx    . Cancer Neg Hx   . Stroke Neg Hx     Social History:  reports that  has never smoked. she has never used smokeless tobacco. She reports that she does not drink alcohol or use drugs.  Allergies:  Allergies  Allergen Reactions  . Codeine Nausea And Vomiting   Prior to Admission medications   Medication Sig Start Date End Date Taking? Authorizing Provider  aspirin 325 MG tablet Take 325 mg by mouth daily.   Yes [provider]  bisacodyl (DULCOLAX) 10 MG suppository Place 10 mg rectally as needed for moderate constipation.   Yes [provider]  magnesium hydroxide (MILK OF MAGNESIA) 400 MG/5ML suspension Take 30 mLs by mouth daily as needed for mild constipation.   Yes [provider]  NUTRITIONAL SUPPLEMENT LIQD Take 120 mLs by mouth daily. MedPass   Yes [provider]  sennosides-docusate sodium (SENOKOT-S) 8.6-50 MG tablet Take 2 tablets by mouth at bedtime.   Yes [provider]  Sodium Phosphates (RA SALINE ENEMA) 19-7 GM/118ML ENEM Place 1 each rectally as needed (for constipation).   Yes [provider]    Scheduled: . aspirin  300 mg Rectal Daily  . enoxaparin (LOVENOX) injection  30 mg Subcutaneous Q24H   Continuous: . dextrose 5 % and 0.45% NaCl 75 mL/hr at 09/18/17 0018  . piperacillin-tazobactam (  ZOSYN)  IV Stopped (09/18/17 2947)  . [START ON 09/19/2017] vancomycin     Anti-infectives (From admission, onward)   Start     Dose/Rate Route Frequency Ordered Stop   09/19/17 2200  vancomycin (VANCOCIN) IVPB 1000 mg/200 mL premix     1,000 mg 200 mL/hr over 60 Minutes Intravenous Every 48 hours 09/17/17 2149     09/18/17 0600  piperacillin-tazobactam (ZOSYN) IVPB 2.25 g     2.25 g 100 mL/hr over 30 Minutes Intravenous Every 8 hours 09/17/17 2149     09/17/17 2145  piperacillin-tazobactam (ZOSYN) IVPB 3.375 g     3.375 g 100 mL/hr over 30 Minutes Intravenous  Once 09/17/17 2119 09/17/17 2222   09/17/17 2145   vancomycin (VANCOCIN) 1,500 mg in sodium chloride 0.9 % 500 mL IVPB     1,500 mg 250 mL/hr over 120 Minutes Intravenous  Once 09/17/17 2119 09/18/17 0024      Results for orders placed or performed during the hospital encounter of 09/17/17 (from the past 48 hour(s))  Comprehensive metabolic panel     Status: Abnormal   Collection Time: 09/17/17  8:51 PM  Result Value Ref Range   Sodium 152 (H) 135 - 145 mmol/L   Potassium 4.2 3.5 - 5.1 mmol/L   Chloride 110 101 - 111 mmol/L   CO2 23 22 - 32 mmol/L   Glucose, Bld 113 (H) 65 - 99 mg/dL   BUN 70 (H) 6 - 20 mg/dL   Creatinine, Ser 2.84 (H) 0.44 - 1.00 mg/dL   Calcium 10.8 (H) 8.9 - 10.3 mg/dL   Total Protein 8.9 (H) 6.5 - 8.1 g/dL   Albumin 2.7 (L) 3.5 - 5.0 g/dL   AST 33 15 - 41 U/L   ALT 21 14 - 54 U/L   Alkaline Phosphatase 107 38 - 126 U/L   Total Bilirubin 1.4 (H) 0.3 - 1.2 mg/dL   GFR calc non Af Amer 14 (L) >60 mL/min   GFR calc Af Amer 16 (L) >60 mL/min    Comment: (NOTE) The eGFR has been calculated using the CKD EPI equation. This calculation has not been validated in all clinical situations. eGFR's persistently <60 mL/min signify possible Chronic Kidney Disease.    Anion gap 19 (H) 5 - 15    Comment: Performed at [facility], [place] [address], [place], [place] [address]  CBC with Differential/Platelet     Status: Abnormal   Collection Time: 09/17/17  8:51 PM  Result Value Ref Range   WBC 22.5 (H) 4.0 - 10.5 K/uL   RBC 4.19 3.87 - 5.11 MIL/uL   Hemoglobin 13.2 12.0 - 15.0 g/dL   HCT 40.4 36.0 - 46.0 %   MCV 96.4 78.0 - 100.0 fL   MCH 31.5 26.0 - 34.0 pg   MCHC 32.7 30.0 - 36.0 g/dL   RDW 15.9 (H) 11.5 - 15.5 %   Platelets 343 150 - 400 K/uL   Neutrophils Relative % 85 %   Neutro Abs 19.0 (H) 1.7 - 7.7 K/uL   Lymphocytes Relative 5 %   Lymphs Abs 1.1 0.7 - 4.0 K/uL   Monocytes Relative 10 %   Monocytes Absolute 2.3 (H) 0.1 - 1.0 K/uL   Eosinophils Relative 0 %   Eosinophils Absolute 0.1 0.0 - 0.7 K/uL    Basophils Relative 0 %   Basophils Absolute 0.0 0.0 - 0.1 K/uL    Comment: Performed at [facility] [address] [place], [place]  [address]  CBG monitoring, ED     Status: Abnormal   Collection Time: 09/17/17  9:04 PM  Result Value Ref Range   Glucose-Capillary 167 (H) 65 - 99 mg/dL  I-Stat CG4 Lactic Acid, ED     Status: Abnormal   Collection Time: 09/17/17  9:50 PM  Result Value Ref Range   Lactic Acid, Venous 1.99 (H) 0.5 - 1.9 mmol/L   Comment NOTIFIED PHYSICIAN   Creatinine, urine, random     Status: None   Collection Time: 09/17/17 10:11 PM  Result Value Ref Range   Creatinine, Urine 165.25 mg/dL    Comment: Performed at [facility], [place] [address], [place], [place] [address]  Sodium, urine, random     Status: None   Collection Time: 09/17/17 10:11 PM  Result Value Ref Range   Sodium, Ur 30 mmol/L    Comment: Performed at [facility] [address] [place], [place] [address]  Urinalysis, Routine w reflex microscopic     Status: Abnormal   Collection Time: 09/17/17 10:14 PM  Result Value Ref Range   Color, Urine AMBER (A) YELLOW    Comment: BIOCHEMICALS MAY BE AFFECTED BY COLOR   APPearance HAZY (A) CLEAR   Specific Gravity, Urine 1.017 1.005 - 1.030   pH 5.0 5.0 - 8.0   Glucose, UA NEGATIVE NEGATIVE mg/dL   Hgb urine dipstick NEGATIVE NEGATIVE   Bilirubin Urine NEGATIVE NEGATIVE   Ketones, ur NEGATIVE NEGATIVE mg/dL   Protein, ur NEGATIVE NEGATIVE mg/dL   Nitrite NEGATIVE NEGATIVE   Leukocytes, UA NEGATIVE NEGATIVE    Comment: Performed at [facility] [address], [place],  [address]  Procalcitonin     Status: None   Collection Time: 09/17/17 11:48 PM  Result Value Ref Range   Procalcitonin 1.26 ng/mL    Comment:        Interpretation: PCT > 0.5 ng/mL and <= 2 ng/mL: Systemic infection (sepsis) is possible, but other conditions are known to elevate PCT as well. (NOTE)       Sepsis PCT Algorithm           Lower  Respiratory Tract                                      Infection PCT Algorithm    ----------------------------     ----------------------------         PCT < 0.25 ng/mL                PCT < 0.10 ng/mL         Strongly encourage             Strongly discourage   discontinuation of antibiotics    initiation of antibiotics    ----------------------------     -----------------------------       PCT 0.25 - 0.50 ng/mL            PCT 0.10 - 0.25 ng/mL               OR       >80% decrease in PCT            Discourage initiation of  antibiotics      Encourage discontinuation           of antibiotics    ----------------------------     -----------------------------         PCT >= 0.50 ng/mL              PCT 0.26 - 0.50 ng/mL                AND       <80% decrease in PCT             Encourage initiation of                                             antibiotics       Encourage continuation           of antibiotics    ----------------------------     -----------------------------        PCT >= 0.50 ng/mL                  PCT > 0.50 ng/mL               AND         increase in PCT                  Strongly encourage                                      initiation of antibiotics    Strongly encourage escalation           of antibiotics                                     -----------------------------                                           PCT <= 0.25 ng/mL                                                 OR                                        > 80% decrease in PCT                                     Discontinue / Do not initiate                                             antibiotics Performed at [facility], [place] [address], [place], [place] [address]   Sedimentation rate     Status: Abnormal   Collection Time: 09/17/17 11:48  PM  Result Value Ref Range   Sed Rate 83 (H) 0 - 22 mm/hr    Comment: Performed at [facility] [address], [place], [place] [address]  Brain natriuretic peptide     Status: Abnormal   Collection Time: 09/17/17 11:49 PM  Result Value Ref Range   B Natriuretic Peptide 133.6 (H) 0.0 - 100.0 pg/mL    Comment: Performed at [facility] [address] [place], [place] [address]  Lactic acid, plasma     Status: None   Collection Time: 09/18/17  1:27 AM  Result Value Ref Range   Lactic Acid, Venous 1.9 0.5 - 1.9 mmol/L    Comment: Performed at [facility] [address] [place], [place] [address]  Basic metabolic panel     Status: Abnormal   Collection Time: 09/18/17  1:27 AM  Result Value Ref Range   Sodium 148 (H) 135 - 145 mmol/L   Potassium 3.8 3.5 - 5.1 mmol/L   Chloride 114 (H) 101 - 111 mmol/L   CO2 21 (L) 22 - 32 mmol/L   Glucose, Bld 117 (H) 65 - 99 mg/dL   BUN 66 (H) 6 - 20 mg/dL   Creatinine, Ser 2.46 (H) 0.44 - 1.00 mg/dL   Calcium 9.7 8.9 - 10.3 mg/dL   GFR calc non Af Amer 16 (L) >60 mL/min   GFR calc Af Amer 19 (L) >60 mL/min    Comment: (NOTE) The eGFR has been calculated using the CKD EPI equation. This calculation has not been validated in all clinical situations. eGFR's persistently <60 mL/min signify possible Chronic Kidney Disease.    Anion gap 13 5 - 15    Comment: Performed at [facility] [address] [place], [place] [address]  TSH     Status: None   Collection Time: 09/18/17  2:00 AM  Result Value Ref Range   TSH 2.857 0.350 - 4.500 uIU/mL    Comment: Performed by a 3rd Generation assay with a functional sensitivity of <=0.01 uIU/mL. Performed at [facility], [place] [address] [place], [place] [address]   Basic metabolic panel     Status: Abnormal   Collection Time: 09/18/17  2:00 AM  Result Value Ref Range   Sodium 147 (H) 135 - 145 mmol/L   Potassium 4.1 3.5 - 5.1 mmol/L   Chloride 112 (H) 101 - 111 mmol/L   CO2 21 (L) 22 - 32 mmol/L   Glucose, Bld 106 (H) 65 - 99 mg/dL   BUN 67 (H) 6 - 20 mg/dL   Creatinine, Ser  2.49 (H) 0.44 - 1.00 mg/dL   Calcium 9.8 8.9 - 10.3 mg/dL   GFR calc non Af Amer 16 (L) >60 mL/min   GFR calc Af Amer 19 (L) >60 mL/min    Comment: (NOTE) The eGFR has been calculated using the CKD EPI equation. This calculation has not been validated in all clinical situations. eGFR's persistently <60 mL/min signify possible Chronic Kidney Disease.    Anion gap 14 5 - 15    Comment: Performed at [facility] [address], [place], [place] [address]  CBC     Status: Abnormal   Collection Time: 09/18/17  2:00 AM  Result Value Ref Range   WBC 19.1 (H) 4.0 - 10.5 K/uL   RBC 3.85 (L) 3.87 - 5.11 MIL/uL   Hemoglobin 11.9 (L) 12.0 - 15.0 g/dL   HCT 36.8 36.0 -  46.0 %   MCV 95.6 78.0 - 100.0 fL   MCH 30.9 26.0 - 34.0 pg   MCHC 32.3 30.0 - 36.0 g/dL   RDW 15.9 (H) 11.5 - 15.5 %   Platelets 286 150 - 400 K/uL    Comment: Performed at [facility] [address] [place], [place] [address]  Lactic acid, plasma     Status: None   Collection Time: 09/18/17  4:16 AM  Result Value Ref Range   Lactic Acid, Venous 1.3 0.5 - 1.9 mmol/L    Comment: Performed at [facility] [address] [place], [place] [address]    Dg Chest 2 View  Result Date: 09/17/2017 CLINICAL DATA:  Chest pain EXAM: CHEST - 2 VIEW COMPARISON:  None. FINDINGS: The heart size and mediastinal contours are within normal limits. Both lungs are clear. The visualized skeletal structures are unremarkable. IMPRESSION: No active cardiopulmonary disease. Electronically Signed   By: Ulyses Jarred M.D.   On: 09/17/2017 18:45    Review of Systems  Unable to perform ROS: Mental acuity   Blood pressure 103/79, pulse 80, temperature (!) 97.5 F (36.4 C), temperature source Oral, resp. rate 18, SpO2 93 %. Physical Exam  Constitutional: No distress.  Obese Elderly female slightly confused but in no acute distress.  She cannot remember any of her past history.  She thinks she has been in the nursing home  approximately 1 month.  She reports she has no children take care of her. There is no height or weight on file to calculate       HENT:  Head: Normocephalic and atraumatic.  Mouth/Throat: No oropharyngeal exudate.  Tongue is dry and red.  Eyes: Right eye exhibits no discharge. Left eye exhibits no discharge. No scleral icterus.  Pupils are equal  Neck: Normal range of motion. Neck supple. No JVD present. No tracheal deviation present. No thyromegaly present.  Cardiovascular: Normal rate, regular rhythm and normal heart sounds.  Distal pulses noted.  Respiratory: Effort normal. No respiratory distress. She has no wheezes (Some end expiratory wheezing.). She exhibits no tenderness.  Decreased breath sounds in the base, few expiratory wheezes, few rales at the base.  She has had a right thoracotomy, right mastectomy she does not remember what that is about.  GI: Soft. Bowel sounds are normal. She exhibits no distension and no mass. There is no tenderness. There is no rebound and no guarding.  She has anterior abdominal melatonin  skin changes.  She also has a well-healed midline abdominal wound.  Musculoskeletal: She exhibits edema (trace).  Lymphadenopathy:    She has no cervical adenopathy.  Neurological: She is alert. No cranial nerve deficit.  She gets some questions, right but cannot remember anything about her medical history.  She was right when she said she was in the skilled nursing facility for about 1 month.  Skin: She is not diaphoretic.  She has 2 large ulcers.  One is on her right lateral leg which appears to be a vascular ulcer.  The second is a decubitus ulcer which measures 5 x 6 cm.  Over the sacrum.  It looks like it is total full-thickness fairly new, and it is malodorous.  The skin around the ulcer is excoriated and erythematous.  Psychiatric: She has a normal mood and affect. Her behavior is normal.   Black area measures 5 x 6 cm, it is draining some and  malodorus.     Right lateral leg ulces  Assessment/Plan: Sacral decubitus, stage IV 5 x 6 cm Right lateral leg vascular ulcer Sepsis/hypotension/hypothermia Acute kidney injury/stage III chronic kidney disease Hypernatremia/dehydration Dementia  Plan: Patient is undergoing fluid resuscitation by Dr. Sarajane Jews.  She has been placed on IV Zosyn and IV vancomycin.  She will need debridement of this ulcer.  We can attempt this once her resuscitation is complete.  There just seems to be one family member available.  During the interim we will start with local wound care and hydrotherapy.  We will follow with you.   Natalie Goodman 09/18/2017, 10:24 AM

## 2017-09-18 NOTE — ED Notes (Signed)
Patient cleaned and turned to right side

## 2017-09-18 NOTE — Progress Notes (Signed)
PROGRESS NOTE  Jodie EchevariaRose Fultz Marrufo UJW:[id number] DOB: 17-Jul-1924 DOA: 09/17/2017 PCP: Etta GrandchildJones, Thomas L, MD  Brief Narrative: 84 year old woman PMH diastolic CHF, dementia, recently hospitalized with discharge to short-term rehab where he was noted to have hypernatremia, acute kidney injury, oral candidiasis, attempt was made to treat with IV hydration but IV could not be established and therefore patient sent to ED.  There large sacral wound was noted as well as ulcer right lower leg.  Sacral wound was felt to be infected, patient was noted to have leukocytosis and subsequently was referred for admission for sepsis thought secondary to sacral wound.  Assessment/Plan Sepsis with hypotension, modest hypothermia, leukocytosis, other findings, secondary to sacral wound.  Urinalysis negative. --Leukocytosis has improved.  Lactic acid is normalized.  Continue empiric antibiotics. Mild hypotension responding to fluids. --continue abx, IVF  Large sacral wound, presumably pressure injury --Surgical consultation for consideration of debridement --Empiric antibiotics --appreciate wound care recs  AKI superimposed on CKD stage III, likely secondary to failure to thrive, dehydration --No significant change in BUN and creatinine.  The pattern suggests prerenal etiology.  --increase IVF, trend BMP  Right lower extremity wound, 1+ DP pulse, chronicity unclear --suspect arterial insufficiency, chronic. No evidence of acute ischemia. --given current GOC and lack of any findings to suggest acute features will hold off on vascular consult unless condition improves  Hypernatremia, dehydration, failure to thrive --IVF as above  Chronic diastolic congestive heart failure --Monitor volume status closely. Appears dry.  Acute metabolic encephalopathy --Treat sepsis, dehydration  Essential hypertension.  Currently borderline hypotensive.  Hold BP meds.  Dementia  Discharged 2/20.  Treated for acute diastolic  heart failure, right lower extremity ulcer.   Appears critically ill, prognosis guarded, may not survive. Discussed with HCPOA/sister Christine by phone. She is realistic, understands pt may not survive. Pt has lost more than 100 pounds over time. Plan to treat as above; if fails, would consider comfort care.  PMT consulted  DVT prophylaxis: enoxaparin Code Status: DNR, confirmed with sister by telephone Family Communication:  Disposition Plan: uncertain    Brendia Sacksaniel Goodrich, MD  [facility] Direct contact: [phone] --Via amion app OR  --[email or web address]; password [email or web address]  7PM-7AM contact night coverage as above 09/18/2017, 12:27 PM  LOS: 1 day   Consultants:  General surgery  Vascular surgery  Procedures:    Antimicrobials:  Zosyn 3/6 >>   Vancomycin 3/6 >>   Interval history/Subjective: Feels ok, no pain in legs, some pain in back.  Objective: Vitals:  Vitals:   09/18/17 1030 09/18/17 1100  BP: 106/66 (!) 109/54  Pulse: 75 78  Resp: 16 16  Temp:    SpO2: 98% 100%    Exam:  Constitutional:  . Appears acutely and chronically ill Eyes:  . pupils and irises appear normal . Normal lids   ENMT:  . grossly normal hearing  . Lips appear normal Respiratory:  . CTA bilaterally, no w/r/r.  . Respiratory effort normal Cardiovascular:  . RRR, no m/r/g . No LE extremity edema   Right DP pulse 1+ Abdomen:  . Soft, ntnd Skin:  . Large sacral ulcer with foul-smelling drainage Right ulcer over lateral lower leg. Right foot DP 1+, foot appears to be normally perfused Psychiatric:  . Mental status o Mood, affect cannot be assessed . judgement and insight appears impaired   I have personally reviewed the following:   Labs:  BUN 67  Creatinine 2.49  WBC 19.1  Hemoglobin 11.9  Lactic acid within normal limits  Imaging studies:  Chest x-ray no acute disease  Medical tests:  EKG sinus rhythm   Scheduled Meds: . aspirin  300 mg Rectal  Daily  . enoxaparin (LOVENOX) injection  30 mg Subcutaneous Q24H   Continuous Infusions: . dextrose 5 % and 0.45% NaCl 150 mL/hr at 09/18/17 1218  . piperacillin-tazobactam (ZOSYN)  IV Stopped (09/18/17 0641)  . [START ON 09/19/2017] vancomycin      Principal Problem:   Sepsis (HCC) Active Problems:   Essential hypertension   Coronary atherosclerosis   Chronic diastolic CHF (congestive heart failure) (HCC)   Hypernatremia   Acute metabolic encephalopathy   Pressure ulcer   AKI (acute kidney injury) (HCC)   CKD (chronic kidney disease), stage III (HCC)   FTT (failure to thrive) in adult   LOS: 1 day

## 2017-09-18 NOTE — ED Notes (Signed)
Attending paged to notify of patient's BP

## 2017-09-18 NOTE — ED Notes (Signed)
Multiple stage 3 pressure ulcers noted to crease of buttocks with green discharge. Stage 3 pressure ulcer noted to right lower leg. MD made aware.

## 2017-09-18 NOTE — ED Notes (Signed)
Meal Tray ordered.  

## 2017-09-18 NOTE — ED Notes (Signed)
Provider at bedside

## 2017-09-18 NOTE — ED Notes (Signed)
[facility] team at bedside

## 2017-09-18 NOTE — Procedures (Signed)
Incision and Debridement Procedure Note  Pre-operative Diagnosis: unstageable sacral decubitus ulcer  Post-operative Diagnosis: Stage IV sacral decubitus ulcer  Indications: Ms. Adriana SimasCook is a 84yo female who presents to the ED with an unstageable sacral decubitus ulcer.  She is from a skilled nursing facility.  Her sister states that she was never turned or moved around which likely contributed to her wound formation.  Due to the presence of necrotic tissue and elevated WBC, we will proceed with debridement.  Anesthesia: not needed  Procedure Details  The procedure, risks and complications have been discussed in detail (including, but not limited to infection, bleeding) with the patient and her sister, and the patient's sister has signed consent to the procedure.  I&D with a #11 blade was performed on the midline sacral decubitus ulcer that initially measured 5x6cm. Once the wound was unroofed, the tissue present was liquefied necrotic fat that was debrided.  No tendon, periosteum, etc were debrided, just subcutaneous necrotic fat and skin.  There was no viable tissue removed.  There was no purulent drainage noted.  This wound was malodorous. Once the debridement was complete, the wound measured 5x8x1.5cm secondary to undermining.  A total of 60cm^3 was debrided.  Once the procedure was complete the wound was packed with a NS moistened Kerlex and covered with dry gauze and tape.  The patient was observed until stable.  Findings: Necrotic skin eschar with necrotic liquefied subcutaneous fat tissue  EBL: 10 cc's  Drains: none  Condition: Tolerated procedure well and Stable   Complications: none.  Letha CapeKelly E Ahmad Vanwey 2:54 PM 09/18/2017

## 2017-09-18 NOTE — ED Notes (Signed)
[facility] team done with procedure

## 2017-09-18 NOTE — ED Notes (Signed)
MD made aware of lactic of 1.99

## 2017-09-18 NOTE — ED Notes (Signed)
Patient is resting, responds to voice, able tell RN her DOB, her name and states that she is at home. Patient states she will eat when she wakes up. Will continue to re-assess

## 2017-09-18 NOTE — Consult Note (Addendum)
WOC Nurse wound consult note Reason for Consult: Wound on right lower leg, sacrum Wound type:  Right lower leg unclear etiology; sacrum represents a pressure injury Pressure Injury POA: Yes Measurement: Right lateral lower leg just above the lateral malleolus measures 6.5 cm x 3 cm x .01 cm.  Wound is dry and has a border that has darkened skin tone.  A foam dressing is present at the time of assessment.  I could not palpate a dorsalis pedis pulse.    Sacrum measures 10 cm x 12 cm x unknown depth as the depth is obscured by darkened necrotic tissue.  This area is highly malodorous and the periwound is erythematous, indurated, and hard.  A foam dressing is present at the time of assessment.  Recommendations:  1. Please obtain a surgical consult for the sacrum as this area exceeds the scope of the WOC nurse.  This area needs surgical debridement. 2. Please consider a vascular consult to evaluate blood flow to the extremity.  I suspect the area may not have adequate blood flow to support healing.  For now, continue with the foam dressings. Thank you for the consult.  Discussed plan of care with the patient and bedside nurse.  WOC nurse will not follow at this time.  Please re-consult the WOC team if needed.  Helmut MusterSherry Korene Dula, RN, MSN, CWOCN, CNS-BC, pager [phone]

## 2017-09-18 NOTE — ED Notes (Signed)
Patient turned to left side

## 2017-09-19 ENCOUNTER — Inpatient Hospital Stay (HOSPITAL_COMMUNITY): Payer: Medicare HMO | Admitting: Certified Registered Nurse Anesthetist

## 2017-09-19 ENCOUNTER — Encounter (HOSPITAL_COMMUNITY): Payer: Self-pay | Admitting: Surgery

## 2017-09-19 ENCOUNTER — Encounter (HOSPITAL_COMMUNITY)
Admission: EM | Disposition: A | Discharge status: Hospice | Payer: Self-pay | Source: Home / Self Care | Attending: Internal Medicine

## 2017-09-19 DIAGNOSIS — I5032 Chronic diastolic (congestive) heart failure: Secondary | ICD-10-CM

## 2017-09-19 HISTORY — PX: I&D EXTREMITY: SHX5045

## 2017-09-19 LAB — BASIC METABOLIC PANEL
ANION GAP: 12 (ref 5–15)
BUN: 43 mg/dL — AB (ref 6–20)
CO2: 20 mmol/L — AB (ref 22–32)
CREATININE: 1.78 mg/dL — AB (ref 0.44–1.00)
Calcium: 9 mg/dL (ref 8.9–10.3)
Chloride: 114 mmol/L — ABNORMAL HIGH (ref 101–111)
GFR calc Af Amer: 28 mL/min — ABNORMAL LOW (ref >60)
GFR calc non Af Amer: 24 mL/min — ABNORMAL LOW (ref >60)
GLUCOSE: 146 mg/dL — AB (ref 65–99)
Potassium: 3.5 mmol/L (ref 3.5–5.1)
Sodium: 146 mmol/L — ABNORMAL HIGH (ref 135–145)

## 2017-09-19 LAB — CBC
HCT: 34.1 % — ABNORMAL LOW (ref 36.0–46.0)
HEMOGLOBIN: 10.6 g/dL — AB (ref 12.0–15.0)
MCH: 30.7 pg (ref 26.0–34.0)
MCHC: 31.1 g/dL (ref 30.0–36.0)
MCV: 98.8 fL (ref 78.0–100.0)
Platelets: 264 10*3/uL (ref 150–400)
RBC: 3.45 MIL/uL — ABNORMAL LOW (ref 3.87–5.11)
RDW: 16.5 % — AB (ref 11.5–15.5)
WBC: 18 10*3/uL — ABNORMAL HIGH (ref 4.0–10.5)

## 2017-09-19 SURGERY — IRRIGATION AND DEBRIDEMENT EXTREMITY
Anesthesia: Monitor Anesthesia Care

## 2017-09-19 MED ORDER — SUCCINYLCHOLINE CHLORIDE 20 MG/ML IJ SOLN
INTRAMUSCULAR | Status: AC
  Filled 2017-09-19: qty 1

## 2017-09-19 MED ORDER — FENTANYL CITRATE (PF) 100 MCG/2ML IJ SOLN: 25.0000 ug | INTRAMUSCULAR | Status: DC | PRN

## 2017-09-19 MED ORDER — LACTATED RINGERS IV SOLN
INTRAVENOUS | Status: DC
  Administered 2017-09-19 (×2): via INTRAVENOUS

## 2017-09-19 MED ORDER — 0.9 % SODIUM CHLORIDE (POUR BTL) OPTIME
TOPICAL | Status: DC | PRN
  Administered 2017-09-19: 1000 mL

## 2017-09-19 MED ORDER — ORAL CARE MOUTH RINSE
15.0000 mL | Freq: Two times a day (BID) | OROMUCOSAL | Status: DC
  Administered 2017-09-19 – 2017-09-23 (×7): 15 mL via OROMUCOSAL

## 2017-09-19 MED ORDER — ONDANSETRON HCL 4 MG/2ML IJ SOLN
INTRAMUSCULAR | Status: AC
  Filled 2017-09-19: qty 2

## 2017-09-19 MED ORDER — ENSURE ENLIVE PO LIQD
237.0000 mL | Freq: Two times a day (BID) | ORAL | Status: DC
  Administered 2017-09-20 – 2017-09-23 (×8): 237 mL via ORAL

## 2017-09-19 MED ORDER — FENTANYL CITRATE (PF) 250 MCG/5ML IJ SOLN
INTRAMUSCULAR | Status: AC
  Filled 2017-09-19: qty 5

## 2017-09-19 MED ORDER — PROPOFOL 500 MG/50ML IV EMUL
INTRAVENOUS | Status: DC | PRN
  Administered 2017-09-19: 75 ug/kg/min via INTRAVENOUS

## 2017-09-19 MED ORDER — PHENYLEPHRINE HCL 10 MG/ML IJ SOLN
INTRAMUSCULAR | Status: DC | PRN
  Administered 2017-09-19: 120 ug via INTRAVENOUS
  Administered 2017-09-19 (×2): 80 ug via INTRAVENOUS

## 2017-09-19 MED ORDER — ONDANSETRON HCL 4 MG/2ML IJ SOLN
INTRAMUSCULAR | Status: DC | PRN
  Administered 2017-09-19: 4 mg via INTRAVENOUS

## 2017-09-19 MED ORDER — PROPOFOL 10 MG/ML IV BOLUS
INTRAVENOUS | Status: DC | PRN
  Administered 2017-09-19: 20 mg via INTRAVENOUS
  Administered 2017-09-19: 10 mg via INTRAVENOUS

## 2017-09-19 MED ORDER — FENTANYL CITRATE (PF) 100 MCG/2ML IJ SOLN
INTRAMUSCULAR | Status: DC | PRN
  Administered 2017-09-19: 50 ug via INTRAVENOUS

## 2017-09-19 MED ORDER — SODIUM CHLORIDE 0.9 % IV BOLUS (SEPSIS)
500.0000 mL | Freq: Once | INTRAVENOUS | Status: AC
  Administered 2017-09-19: 500 mL via INTRAVENOUS

## 2017-09-19 MED ORDER — BUPIVACAINE-EPINEPHRINE 0.25% -1:200000 IJ SOLN
INTRAMUSCULAR | Status: AC
  Filled 2017-09-19: qty 1

## 2017-09-19 MED ORDER — LIDOCAINE HCL (CARDIAC) 20 MG/ML IV SOLN
INTRAVENOUS | Status: AC
  Filled 2017-09-19: qty 5

## 2017-09-19 MED ORDER — PROPOFOL 500 MG/50ML IV EMUL
INTRAVENOUS | Status: AC
  Filled 2017-09-19: qty 50

## 2017-09-19 MED ORDER — LIDOCAINE HCL (PF) 1 % IJ SOLN
INTRAMUSCULAR | Status: AC
  Filled 2017-09-19: qty 30

## 2017-09-19 MED ORDER — ONDANSETRON HCL 4 MG/2ML IJ SOLN: 4.0000 mg | Freq: Once | INTRAMUSCULAR | Status: DC | PRN

## 2017-09-19 SURGICAL SUPPLY — 38 items
BENZOIN TINCTURE PRP APPL 2/3 (GAUZE/BANDAGES/DRESSINGS) ×3 IMPLANT
BLADE CLIPPER SURG (BLADE) IMPLANT
BNDG GAUZE ELAST 4 BULKY (GAUZE/BANDAGES/DRESSINGS) ×3 IMPLANT
CANISTER SUCT 3000ML PPV (MISCELLANEOUS) ×3 IMPLANT
COVER SURGICAL LIGHT HANDLE (MISCELLANEOUS) ×3 IMPLANT
DRAPE LAPAROTOMY 100X72 PEDS (DRAPES) ×3 IMPLANT
DRAPE LAPAROTOMY T 102X78X121 (DRAPES) ×3 IMPLANT
DRAPE UTILITY XL STRL (DRAPES) ×6 IMPLANT
ELECT CAUTERY BLADE 6.4 (BLADE) ×3 IMPLANT
ELECT REM PT RETURN 9FT ADLT (ELECTROSURGICAL) ×3
ELECTRODE REM PT RTRN 9FT ADLT (ELECTROSURGICAL) ×1 IMPLANT
GAUZE SPONGE 4X4 12PLY STRL (GAUZE/BANDAGES/DRESSINGS) IMPLANT
GAUZE SPONGE 4X4 12PLY STRL LF (GAUZE/BANDAGES/DRESSINGS) ×3 IMPLANT
GLOVE BIO SURGEON STRL SZ7 (GLOVE) ×3 IMPLANT
GLOVE BIOGEL PI IND STRL 7.5 (GLOVE) ×1 IMPLANT
GLOVE BIOGEL PI INDICATOR 7.5 (GLOVE) ×2
GOWN STRL REUS W/ TWL LRG LVL3 (GOWN DISPOSABLE) ×2 IMPLANT
GOWN STRL REUS W/TWL LRG LVL3 (GOWN DISPOSABLE) ×4
KIT BASIN OR (CUSTOM PROCEDURE TRAY) ×3 IMPLANT
KIT ROOM TURNOVER OR (KITS) ×3 IMPLANT
NEEDLE HYPO 25GX1X1/2 BEV (NEEDLE) ×3 IMPLANT
NS IRRIG 1000ML POUR BTL (IV SOLUTION) ×3 IMPLANT
PACK GENERAL/GYN (CUSTOM PROCEDURE TRAY) ×3 IMPLANT
PACK SURGICAL SETUP 50X90 (CUSTOM PROCEDURE TRAY) ×3 IMPLANT
PAD ABD 8X10 STRL (GAUZE/BANDAGES/DRESSINGS) ×3 IMPLANT
PAD ARMBOARD 7.5X6 YLW CONV (MISCELLANEOUS) ×6 IMPLANT
PENCIL BUTTON HOLSTER BLD 10FT (ELECTRODE) ×3 IMPLANT
SPECIMEN JAR SMALL (MISCELLANEOUS) ×3 IMPLANT
SPONGE LAP 18X18 X RAY DECT (DISPOSABLE) ×3 IMPLANT
SUT ETHILON 2 0 FS 18 (SUTURE) IMPLANT
SUT VIC AB 3-0 SH 18 (SUTURE) IMPLANT
SYR CONTROL 10ML LL (SYRINGE) ×3 IMPLANT
TAPE CLOTH SURG 4X10 WHT LF (GAUZE/BANDAGES/DRESSINGS) ×3 IMPLANT
TOWEL OR 17X24 6PK STRL BLUE (TOWEL DISPOSABLE) ×3 IMPLANT
TOWEL OR 17X26 10 PK STRL BLUE (TOWEL DISPOSABLE) ×3 IMPLANT
TUBE CONNECTING 12'X1/4 (SUCTIONS) ×1
TUBE CONNECTING 12X1/4 (SUCTIONS) ×2 IMPLANT
YANKAUER SUCT BULB TIP NO VENT (SUCTIONS) ×3 IMPLANT

## 2017-09-19 NOTE — Progress Notes (Signed)
Appears more alert at this time. VS within normal range. IV fluid continues.

## 2017-09-19 NOTE — Progress Notes (Signed)
Palliative Medicine consult noted. Due to high referral volume, there may be a delay seeing this patient. Please call the Palliative Medicine Team office at [phone] if recommendations are needed in the interim.  Thank you for inviting us to see this patient.  Diante Barley G. Natale Thoma, RN, BSN, CHPN Palliative Medicine Team 09/19/2017 12:54 PM Office [phone] 

## 2017-09-19 NOTE — Op Note (Signed)
09/17/2017 - 09/19/2017  10:34 AM  PATIENT:  Natalie Goodman  84 y.o. female  Patient Care Team: Janith Lima, MD as PCP - General  PRE-OPERATIVE DIAGNOSIS:  sacral decub  POST-OPERATIVE DIAGNOSIS:  sacral decub  PROCEDURE:  Incisional debridement of sacral decubitus ulcer  SURGEON:  Sharon Mt. Muzammil Bruins, MD  ASSISTANT: None  ANESTHESIA:   MAC  COUNTS:  Sponge, needle and instrument counts were reported correct x2 at the conclusion of the operation.  EBL: 10cc  DRAINS: None  SPECIMEN: None  COMPLICATIONS: none  FINDINGS: Stage IV sacral decubitus ulcer extending down the the sacral bone. Entire base of wound with nonviable tissue. Wound measured 9 x 14cm. Debridement was performed sharply with knife down to bone. Healthy bleeding tissue was encountered at entire wound periphery and all nonviable/devitalized tissue removed.  DISPOSITION: PACU in satisfactory condition  INDICATION: 24F with dementia and sacral decubitus ulcer which developed over the last 30 days while recovering in a nursing facility. She was brought to th ER yesterday and noted to have a necrotic sacral decubitus ulcer. The overlying skin was dead and therefore removed at bedside. Necrotic fat removed from the wound. The wound margins were not viable.  She was admitted to the medicine service for correction of electrolyte abnormalities, hydration, and IV antibiotics.  This morning her electrolytes were more normal and she has been resuscitated with fluids.  Given the current necrotic edges of the wound base, further debridement in the OR was deemed necessary.  The pathophysiology of decubitus ulcers was detailed with the patient and her sister who is her emergency contact and decision maker. The planned procedure, material risks (including, but not limited to, pain, bleeding, infection, scarring, damage to surrounding structures, need for additional procedures, aspiration, pneumonia, heart attack, stroke, death),  benefits and alternatives were explained. The patient and her sister voiced understanding and their questions were answered to their satisfaction and they elected to proceed with surgery.  DESCRIPTION: The patient was identified in preop holding and taken to the OR where they were placed on the operating room table. MAC anesthesia was induced without difficulty. The patient was positioned in left lateral decubitus with a bean bag and secured. The patient was then prepped and draped in the usual sterile fashion. A surgical timeout was performed indicating the correct patient, procedure, positioning and need for preoperative antibiotics.   Nonviable skin edges were then excised sharply as well as all devitalized underlying subcutaneous tissue.  This extended down to the level of the sacral bone.  Once all devitalized tissue was removed and healthy bleeding wound edges were encountered, the wound was irrigated with copious amounts of sterile saline.  Hemostasis was then achieved with electrocautery.  The final wound measured 9 x 14 cm and again extended down to bone.  The debridement had been performed sharply with a knife.  The wound was then loosely packed with a moist kerlex, covered with 4x4s and an ABD applied and secured. The patient was then awakened, placed supine and transferred to a stretcher for transport to PACU in satisfactory condition.  Note: This dictation was prepared with Dragon/digital dictation along with [employer]. Any transcriptional errors that result from this process are unintentional.

## 2017-09-19 NOTE — Anesthesia Preprocedure Evaluation (Addendum)
Anesthesia Evaluation  Patient identified by MRN, date of birth, ID band Patient awake    Reviewed: Allergy & Precautions, NPO status , Patient's Chart, lab work & pertinent test results  Airway Mallampati: II  TM Distance: >3 FB     Dental  (+) Poor Dentition, Teeth Intact   Pulmonary    breath sounds clear to auscultation       Cardiovascular hypertension,  Rhythm:Regular     Neuro/Psych    GI/Hepatic   Endo/Other    Renal/GU      Musculoskeletal   Abdominal   Peds  Hematology   Anesthesia Other Findings   Reproductive/Obstetrics                            Anesthesia Physical Anesthesia Plan  ASA: III  Anesthesia Plan: MAC   Post-op Pain Management:    Induction:   PONV Risk Score and Plan: Ondansetron  Airway Management Planned: Natural Airway and Simple Face Mask  Additional Equipment:   Intra-op Plan:   Post-operative Plan:   Informed Consent: I have reviewed the patients History and Physical, chart, labs and discussed the procedure including the risks, benefits and alternatives for the proposed anesthesia with the patient or authorized representative who has indicated his/her understanding and acceptance.   Dental advisory given  Plan Discussed with: CRNA and Anesthesiologist  Anesthesia Plan Comments:         Anesthesia Quick Evaluation

## 2017-09-19 NOTE — Progress Notes (Signed)
Subjective No acute events. Denies complaints this morning  Objective: Vital signs in last 24 hours: Temp:  [95.2 F (35.1 C)-98.5 F (36.9 C)] 97.3 F (36.3 C) (03/08 0800) Pulse Rate:  [67-80] 75 (03/08 0705) Resp:  [13-19] 15 (03/08 0705) BP: (93-140)/(46-79) 93/52 (03/08 0705) SpO2:  [93 %-100 %] 100 % (03/08 0705) Weight:  [73.4 kg (161 lb 13.1 oz)-73.5 kg (162 lb)] 73.5 kg (162 lb) (03/08 0625)    Intake/Output from previous day: 03/07 0701 - 03/08 0700 In: 3430 [I.V.:2830; IV Piggyback:600] Out: 800 [Urine:800] Intake/Output this shift: Total I/O In: 1060 [I.V.:1010; IV Piggyback:50] Out: -   Gen: NAD, comfortable CV: RRR Pulm: Normal work of breathing Abd: Soft, NT/ND Skin: Sacral decubitus ulcer s/p bedside opening/debridement with necrotic base Ext: SCDs in place  Lab Results: CBC  Recent Labs    09/18/17 0200 09/19/17 0447  WBC 19.1* 18.0*  HGB 11.9* 10.6*  HCT 36.8 34.1*  PLT 286 264   BMET Recent Labs    09/18/17 0200 09/19/17 0447  NA 147* 146*  K 4.1 3.5  CL 112* 114*  CO2 21* 20*  GLUCOSE 106* 146*  BUN 67* 43*  CREATININE 2.49* 1.78*  CALCIUM 9.8 9.0    Anti-infectives: Anti-infectives (From admission, onward)   Start     Dose/Rate Route Frequency Ordered Stop   09/19/17 2200  [MAR Hold]  vancomycin (VANCOCIN) IVPB 1000 mg/200 mL premix     (MAR Hold since 09/19/17 0909)   1,000 mg 200 mL/hr over 60 Minutes Intravenous Every 48 hours 09/17/17 2149     09/18/17 0600  [MAR Hold]  piperacillin-tazobactam (ZOSYN) IVPB 2.25 g     (MAR Hold since 09/19/17 0909)   2.25 g 100 mL/hr over 30 Minutes Intravenous Every 8 hours 09/17/17 2149     09/17/17 2145  piperacillin-tazobactam (ZOSYN) IVPB 3.375 g     3.375 g 100 mL/hr over 30 Minutes Intravenous  Once 09/17/17 2119 09/17/17 2222   09/17/17 2145  vancomycin (VANCOCIN) 1,500 mg in sodium chloride 0.9 % 500 mL IVPB     1,500 mg 250 mL/hr over 120 Minutes Intravenous  Once 09/17/17 2119  09/18/17 0024       Assessment/Plan: Patient Active Problem List   Diagnosis Date Noted  . Pressure ulcer 09/18/2017  . AKI (acute kidney injury) (HCC) 09/18/2017  . CKD (chronic kidney disease), stage III (HCC) 09/18/2017  . FTT (failure to thrive) in adult 09/18/2017  . Acute metabolic encephalopathy 09/17/2017  . Sepsis (HCC) 09/17/2017  . Acute diastolic CHF (congestive heart failure) (HCC) 08/30/2017  . Acute lower UTI 08/30/2017  . Hypernatremia 08/30/2017  . Chronic diastolic CHF (congestive heart failure) (HCC) 08/26/2017  . TSH elevation 08/25/2017  . Hypokalemia 08/25/2017  . Primary osteoarthritis of both knees 10/21/2016  . Deficiency anemia 11/29/2015  . Routine general medical examination at a health care facility 03/27/2014  . Eczema, dyshidrotic 04/01/2013  . Obesity (BMI 35.0-39.9 without comorbidity) 03/10/2013  . ESOPHAGEAL STRICTURE 06/27/2010  . GERD 06/12/2010  . KIDNEY DISEASE, CHRONIC, STAGE I 03/15/2010  . Hyperlipidemia with target LDL less than 160 03/01/2010  . Essential hypertension 03/01/2010  . Coronary atherosclerosis 03/01/2010  . PARESTHESIA 03/01/2010   A/P 26F with dementia and sacral decubitus ulcer  -Plan incision and debridement of decubitus ulcer in OR -I discussed all of the above with the patient and her sister (emergency contact). I discussed the procedure, material risks (including but not limited to bleeding, need for additional  procedures, anesthesia complications including aspiration, pneumonia, heart attack, stroke and death) benefits and alternatives. They voiced understanding. Their questions were answered to their satisfaction and they elected to proceed   LOS: 2 days   Stephanie Couphristopher M. Cliffton AstersWhite, M.D. General and Colorectal Surgery [facility]

## 2017-09-19 NOTE — Progress Notes (Signed)
Bear hugger applied 

## 2017-09-19 NOTE — Transfer of Care (Signed)
Immediate Anesthesia Transfer of Care Note  Patient: Natalie Goodman  Procedure(s) Performed: IRRIGATION AND DEBRIDEMENT OF SACRAL DECUBUTIS 9 x 14cm (N/A )  Patient Location: PACU  Anesthesia Type:MAC  Level of Consciousness: drowsy  Airway & Oxygen Therapy: Patient Spontanous Breathing and Patient connected to nasal cannula oxygen  Post-op Assessment: Report given to RN  Post vital signs: Reviewed and stable  Last Vitals:  Vitals:   09/19/17 0800 09/19/17 1042  BP:    Pulse:  76  Resp:  20  Temp: (!) 36.3 C 36.5 C  SpO2:  100%    Last Pain:  Vitals:   09/19/17 1042  TempSrc:   PainSc: (P) Asleep         Complications: No apparent anesthesia complications

## 2017-09-19 NOTE — Progress Notes (Signed)
BP 96/46, T95.4 ax. Had output quantity sufficient. As per vs guideline MD paged. Patient asleep at this time.

## 2017-09-19 NOTE — Progress Notes (Signed)
Initial Nutrition Assessment  DOCUMENTATION CODES:   Severe malnutrition in context of chronic illness  INTERVENTION: Recommend:  Supplementation  Boost Breeze BID between meals  Juven BID between meals  Magic Cup TID with meals MVI daily d/c Ensure Downgrade to D3   NUTRITION DIAGNOSIS:   Severe Malnutrition related to chronic illness as evidenced by severe muscle depletion, percent weight loss(31% x 9 months).  GOAL:   Patient will meet greater than or equal to 90% of their needs  MONITOR:   PO intake, Supplement acceptance, Skin  REASON FOR ASSESSMENT:   Malnutrition Screening Tool      ASSESSMENT:  Pt with PMH of CKD stage III, AKI, CHF, admitted from SNF with altered mental status r/t sepsis, FTT (adult), 9x14cm stage IV sacral decubitus ulcer and vascular ulcer RLE.   Sister reported 100lbs unintentional weight loss over time. No family present at time of visit. Pt awake and alert and able to answer questions.   Pt states that she feels 100 times better.   Pt confirmed weight loss over the past couple of months while at [facility]. Weight loss began in May 2018, but with rapid decline within the last month. 7% weight loss in 15 days, 22% weight loss in 25 days, and 31% in 9 months with severe muscle depletion and mild fat depletion. Pt reports taste changes and that she did not like the food offered at SNF; she would pick at her food reporting that it tastes like dirt or sand. Pt reports that her appetite before SNF was good and that she ate well, "anything I wanted". Pt reports she does not like Boost/Ensure she does like juice and ice cream and is open to supplements. Noted poor dentition, per patient she lost her dentures. Pt states that she is able to eat most things, but meat is difficult to eat unless soft or chopped.   3/8 Pt status post I&D of sacral wound.  Per MD Pt may not survive admission, palliative care consulted.   NUTRITION - FOCUSED PHYSICAL  EXAM:      Most Recent Value  Orbital Region  Mild depletion  Upper Arm Region  No depletion  Thoracic and Lumbar Region  No depletion  Buccal Region  No depletion  Temple Region  Severe depletion  Clavicle Bone Region  Severe depletion  Clavicle and Acromion Bone Region  Severe depletion  Scapular Bone Region  Unable to assess  Dorsal Hand  Severe depletion  Patellar Region  No depletion  Anterior Thigh Region  No depletion  Posterior Calf Region  No depletion  Edema (RD Assessment)  Mild  Hair  Unable to assess  Eyes  Unable to assess  Mouth  Unable to assess  Skin  Reviewed  Nails  Reviewed       Diet Order:  Diet NPO time specified  EDUCATION NEEDS:   No education needs have been identified at this time  Skin:  Skin Assessment: Skin Integrity Issues: Skin Integrity Issues:: Stage IV, Other (Comment), Incisions Stage IV: sacral pressure ulcer Incisions: buttocks Other: vascular ulcer RLeg - calf  Last BM:  Unknown  Height: 5'5"  Ht Readings from Last 1 Encounters:  09/18/17 5\' 5"  (1.651 m)    Weight:   Wt Readings from Last 1 Encounters:  09/19/17 162 lb (73.5 kg)    Ideal Body Weight:  56.8 kg  BMI:  Body mass index is 26.96 kg/m.  Estimated Nutritional Needs:   Kcal:  1700-1900  Protein:  110-125 grams  Fluid:  >1.7 L/day    Sherrine MaplesMelissa Buford Gayler, Dietetic Intern

## 2017-09-19 NOTE — Anesthesia Postprocedure Evaluation (Signed)
Anesthesia Post Note  Patient: Natalie Goodman  Procedure(s) Performed: IRRIGATION AND DEBRIDEMENT OF SACRAL DECUBUTIS 9 x 14cm (N/A )     Patient location during evaluation: PACU Anesthesia Type: MAC Level of consciousness: awake and alert Pain management: pain level controlled Vital Signs Assessment: post-procedure vital signs reviewed and stable Respiratory status: spontaneous breathing, nonlabored ventilation, respiratory function stable and patient connected to nasal cannula oxygen Cardiovascular status: stable and blood pressure returned to baseline Postop Assessment: no apparent nausea or vomiting Anesthetic complications: no    Last Vitals:  Vitals:   09/19/17 1130 09/19/17 1225  BP: (!) 99/51   Pulse: 67   Resp: 14   Temp: 36.7 C (!) 35.4 C  SpO2: 100%     Last Pain:  Vitals:   09/19/17 1225  TempSrc: Oral  PainSc:                  Gatha Mcnulty COKER

## 2017-09-19 NOTE — [place] FL2 (Signed)
[place] MEDICAID FL2 LEVEL OF CARE SCREENING TOOL     IDENTIFICATION  Patient Name: Natalie Goodman Birthdate: 24-Sep-1924 Sex: female Admission Date (Current Location): 09/17/2017  [facility] [place]   Facility and Address:  [facility], [address], [place], [place] [address]      Provider Number: [id number]  Attending Physician Name and Address:  Zannie CoveJoseph, Preetha, MD  Relative Name and Phone Number:       Current Level of Care: Hospital Recommended Level of Care: Skilled Nursing Facility Prior Approval Number:    Date Approved/Denied:   PASRR Number: [id number]  Discharge Plan: SNF    Current Diagnoses: Patient Active Problem List   Diagnosis Date Noted  . Pressure ulcer 09/18/2017  . AKI (acute kidney injury) (HCC) 09/18/2017  . CKD (chronic kidney disease), stage III (HCC) 09/18/2017  . FTT (failure to thrive) in adult 09/18/2017  . Acute metabolic encephalopathy 09/17/2017  . Sepsis (HCC) 09/17/2017  . Acute diastolic CHF (congestive heart failure) (HCC) 08/30/2017  . Acute lower UTI 08/30/2017  . Hypernatremia 08/30/2017  . Chronic diastolic CHF (congestive heart failure) (HCC) 08/26/2017  . TSH elevation 08/25/2017  . Hypokalemia 08/25/2017  . Primary osteoarthritis of both knees 10/21/2016  . Deficiency anemia 11/29/2015  . Routine general medical examination at a health care facility 03/27/2014  . Eczema, dyshidrotic 04/01/2013  . Obesity (BMI 35.0-39.9 without comorbidity) 03/10/2013  . ESOPHAGEAL STRICTURE 06/27/2010  . GERD 06/12/2010  . KIDNEY DISEASE, CHRONIC, STAGE I 03/15/2010  . Hyperlipidemia with target LDL less than 160 03/01/2010  . Essential hypertension 03/01/2010  . Coronary atherosclerosis 03/01/2010  . PARESTHESIA 03/01/2010    Orientation RESPIRATION BLADDER Height & Weight     Self, Place  Normal Continent, External catheter Weight: 73.5 kg (162 lb) Height:  5\' 5"  (165.1 cm)   BEHAVIORAL SYMPTOMS/MOOD NEUROLOGICAL BOWEL NUTRITION STATUS      Continent Diet(Please see DC Summary)  AMBULATORY STATUS COMMUNICATION OF NEEDS Skin   Extensive Assist Verbally PU Stage and Appropriate Care(Stage III on buttocks and legs; closed incision on leg;non pressure wound on leg)                       Personal Care Assistance Level of Assistance  Bathing, Feeding, Dressing Bathing Assistance: Maximum assistance Feeding assistance: Independent Dressing Assistance: Maximum assistance     Functional Limitations Info  Sight, Hearing, Speech Sight Info: Impaired Hearing Info: Impaired Speech Info: Adequate    SPECIAL CARE FACTORS FREQUENCY  PT (By licensed PT), OT (By licensed OT)     PT Frequency: 5x OT Frequency: 5x            Contractures Contractures Info: Not present    Additional Factors Info  Code Status, Allergies Code Status Info: DNR Allergies Info: Codeine           Current Medications (09/19/2017):  This is the current hospital active medication list Current Facility-Administered Medications  Medication Dose Route Frequency Provider Last Rate Last Dose  . acetaminophen (TYLENOL) tablet 650 mg  650 mg Oral Q6H PRN Lorretta HarpNiu, Xilin, MD       Or  . acetaminophen (TYLENOL) suppository 650 mg  650 mg Rectal Q6H PRN Lorretta HarpNiu, Xilin, MD      . aspirin suppository 300 mg  300 mg Rectal Daily Lorretta HarpNiu, Xilin, MD   300 mg at 09/18/17 0943  . bisacodyl (DULCOLAX) suppository 10 mg  10 mg Rectal  PRN Lorretta Harp, MD      . collagenase Melburn Popper) ointment   Topical Daily Standley Brooking, MD      . dextrose 5 %-0.45 % sodium chloride infusion   Intravenous Continuous Standley Brooking, MD 150 mL/hr at 09/19/17 0706    . enoxaparin (LOVENOX) injection 30 mg  30 mg Subcutaneous Q24H Lorretta Harp, MD   30 mg at 09/18/17 1732  . feeding supplement (ENSURE ENLIVE) (ENSURE ENLIVE) liquid 237 mL  237 mL Oral BID BM Standley Brooking, MD      . lactated ringers infusion    Intravenous Continuous Kipp Brood, MD 10 mL/hr at 09/19/17 0932    . magnesium hydroxide (MILK OF MAGNESIA) suspension 30 mL  30 mL Oral Daily PRN Lorretta Harp, MD      . morphine 4 MG/ML injection 1 mg  1 mg Intravenous Q4H PRN Lorretta Harp, MD   1 mg at 09/18/17 0229  . ondansetron (ZOFRAN) tablet 4 mg  4 mg Oral Q6H PRN Lorretta Harp, MD       Or  . ondansetron [facility]) injection 4 mg  4 mg Intravenous Q6H PRN Lorretta Harp, MD      . piperacillin-tazobactam (ZOSYN) IVPB 2.25 g  2.25 g Intravenous Q8H Sampson Si, RPH   Stopped at 09/19/17 1610  . sodium phosphate (FLEET) 7-19 GM/118ML enema 1 enema  1 enema Rectal PRN Lorretta Harp, MD      . vancomycin (VANCOCIN) IVPB 1000 mg/200 mL premix  1,000 mg Intravenous Q48H Mancheril, Candis Schatz, [facility]         Discharge Medications: Please see discharge summary for a list of discharge medications.  Relevant Imaging Results:  Relevant Lab Results:   Additional Information SSN: [id number]  Mearl Latin, LCSWA

## 2017-09-19 NOTE — Progress Notes (Signed)
PROGRESS NOTE  Natalie Goodman XBJ:[id number] DOB: [birth year] DOA: 09/17/2017 PCP: Etta Grandchild, MD  Brief Narrative: 84 year old woman PMH diastolic CHF, dementia, recently hospitalized with discharge to short-term rehab where he was noted to have hypernatremia, acute kidney injury, oral candidiasis, attempt was made to treat with IV hydration but IV could not be established and therefore patient sent to ED.  There large sacral wound was noted as well as ulcer right lower leg.  Sacral wound was felt to be infected, patient was noted to have leukocytosis and subsequently was referred for admission for sepsis thought secondary to sacral wound.  Assessment/Plan  Sepsis with hypotension, modest hypothermia, leukocytosis, other findings, secondary to extensive sacral wound.   -improving -continue Iv Vanc/Zosyn -FU Blood Cx -continue D51/2NS cut down rate to 100cc/hr  Large sacral wound, presumably pressure injury --Surgical consulted, underwent bedside and then further debridement in OR today -very large wound --Empiric antibiotics --wound care  AKI superimposed on CKD stage III, likely secondary to failure to thrive, dehydration --No significant change in BUN and creatinine.  The pattern suggests prerenal etiology.  --continue IVF, monitor creatinine  Right lower extremity wound, 1+ DP pulse, chronicity unclear --suspect arterial insufficiency, chronic. No evidence of acute ischemia. --given current GOC and lack of any findings to suggest acute features will hold off on vascular consult  Hypernatremia, dehydration, failure to thrive --IVF as above  Chronic diastolic congestive heart failure --Monitor volume status closely. Appears dry.in length and  Acute metabolic encephalopathy --Treat sepsis, dehydration  Essential hypertension.  Currently borderline hypotensive.  Hold BP meds.  Dementia  ETHICS: Appears critically ill, prognosis guarded, may not survive. Dr.Goodrich  Discussed with HCPOA/sister Natalie Goodman by phone. She is realistic, understands pt may not survive. Pt has lost more than 100 pounds over time. Plan to treat as above; if fails, would consider comfort care.  PMT consulted  DVT prophylaxis: enoxaparin Code Status: DNR Family Communication:  Disposition Plan: to be determined   Zannie Cove, MD  [facility] Page via [email or web address]; password [email or web address]  7PM-7AM contact night coverage as above 09/19/2017, 2:22 PM  LOS: 2 days   Consultants:  General surgery  Vascular surgery  Procedures:    Antimicrobials:  Zosyn 3/6 >>   Vancomycin 3/6 >>   Interval history/Subjective: Back from OR, drowsy that  Objective: Vitals:  Vitals:   09/19/17 1130 09/19/17 1225  BP: (!) 99/51   Pulse: 67   Resp: 14   Temp: 98 F (36.7 C) (!) 95.7 F (35.4 C)  SpO2: 100%     Exam:  Gen: chronically ill-appearing frail, elderly female HEENT:pupils equal reactive Lungs: clear bilaterally CVS: decreased breath sounds at bases, rest clear Abd: soft, Non tender, non distended, BS present Extremities: No Cyanosis, Clubbing or edema Skin: large extensive sacral decubitus ulcer just debrided in the OR, with dressing Ulcer over R leg  I have personally reviewed the following:   -labs reviewed Imaging studies:  Chest x-ray no acute disease  Medical tests:  EKG sinus rhythm   Scheduled Meds: . aspirin  300 mg Rectal Daily  . collagenase   Topical Daily  . enoxaparin (LOVENOX) injection  30 mg Subcutaneous Q24H  . feeding supplement (ENSURE ENLIVE)  237 mL Oral BID BM   Continuous Infusions: . dextrose 5 % and 0.45% NaCl 150 mL/hr at 09/19/17 0706  . piperacillin-tazobactam (ZOSYN)  IV Stopped (09/19/17 0829)  . vancomycin      Principal Problem:   Sepsis (HCC) Active Problems:  Essential hypertension   Coronary atherosclerosis   Chronic diastolic CHF (congestive heart failure) (HCC)   Hypernatremia   Acute metabolic  encephalopathy   Pressure ulcer   AKI (acute kidney injury) (HCC)   CKD (chronic kidney disease), stage III (HCC)   FTT (failure to thrive) in adult   LOS: 2 days

## 2017-09-19 NOTE — Anesthesia Procedure Notes (Addendum)
Procedure Name: MAC Date/Time: 09/19/2017 9:52 AM Performed by: Leonor Liv, CRNA Oxygen Delivery Method: Simple face mask Placement Confirmation: positive ETCO2

## 2017-09-20 ENCOUNTER — Encounter (HOSPITAL_COMMUNITY): Payer: Self-pay | Admitting: Surgery

## 2017-09-20 DIAGNOSIS — Z515 Encounter for palliative care: Secondary | ICD-10-CM

## 2017-09-20 DIAGNOSIS — A419 Sepsis, unspecified organism: Principal | ICD-10-CM

## 2017-09-20 DIAGNOSIS — Z7189 Other specified counseling: Secondary | ICD-10-CM

## 2017-09-20 LAB — BASIC METABOLIC PANEL
ANION GAP: 11 (ref 5–15)
BUN: 29 mg/dL — ABNORMAL HIGH (ref 6–20)
CALCIUM: 8.6 mg/dL — AB (ref 8.9–10.3)
CO2: 21 mmol/L — AB (ref 22–32)
CREATININE: 1.64 mg/dL — AB (ref 0.44–1.00)
Chloride: 112 mmol/L — ABNORMAL HIGH (ref 101–111)
GFR, EST AFRICAN AMERICAN: 31 mL/min — AB (ref >60)
GFR, EST NON AFRICAN AMERICAN: 26 mL/min — AB (ref >60)
Glucose, Bld: 106 mg/dL — ABNORMAL HIGH (ref 65–99)
Potassium: 3.3 mmol/L — ABNORMAL LOW (ref 3.5–5.1)
Sodium: 144 mmol/L (ref 135–145)

## 2017-09-20 LAB — CBC
HEMATOCRIT: 32.5 % — AB (ref 36.0–46.0)
Hemoglobin: 10.3 g/dL — ABNORMAL LOW (ref 12.0–15.0)
MCH: 30.6 pg (ref 26.0–34.0)
MCHC: 31.7 g/dL (ref 30.0–36.0)
MCV: 96.4 fL (ref 78.0–100.0)
Platelets: 249 10*3/uL (ref 150–400)
RBC: 3.37 MIL/uL — ABNORMAL LOW (ref 3.87–5.11)
RDW: 16 % — AB (ref 11.5–15.5)
WBC: 14.8 10*3/uL — AB (ref 4.0–10.5)

## 2017-09-20 MED ORDER — PIPERACILLIN-TAZOBACTAM 3.375 G IVPB
3.3750 g | Freq: Three times a day (TID) | INTRAVENOUS | Status: DC
  Administered 2017-09-20 – 2017-09-21 (×3): 3.375 g via INTRAVENOUS
  Filled 2017-09-20 (×4): qty 50

## 2017-09-20 MED ORDER — VANCOMYCIN HCL IN DEXTROSE 750-5 MG/150ML-% IV SOLN
750.0000 mg | INTRAVENOUS | Status: DC
  Administered 2017-09-21: 750 mg via INTRAVENOUS
  Filled 2017-09-20: qty 150

## 2017-09-20 MED ORDER — FENTANYL CITRATE (PF) 100 MCG/2ML IJ SOLN: 25.0000 ug | INTRAMUSCULAR | Status: DC | PRN

## 2017-09-20 MED ORDER — MORPHINE SULFATE (PF) 2 MG/ML IV SOLN
1.0000 mg | INTRAVENOUS | Status: DC | PRN
  Administered 2017-09-20: 1 mg via INTRAVENOUS
  Administered 2017-09-21 – 2017-09-22 (×2): 2 mg via INTRAVENOUS
  Filled 2017-09-20 (×2): qty 1

## 2017-09-20 MED ORDER — ONDANSETRON HCL 4 MG/2ML IJ SOLN: 4.0000 mg | Freq: Once | INTRAMUSCULAR | Status: DC | PRN

## 2017-09-20 NOTE — Progress Notes (Signed)
Physical Therapy Wound Treatment Patient Details  Name: Natalie Goodman MRN: [id number] Date of Birth: July 12, 1925  Today's Date: 09/20/2017 Time: 3846-6599 Time Calculation (min): 43 min  Subjective  Subjective: Pt quiet and flat but agreeable to therapy Patient and Family Stated Goals: Decrease pain and heal wound Prior Treatments: Surgical I&D 09/19/17.   Pain Score: Pt appears comfortable for the most part but is grimacing at times during pulsed-lavage  Wound Assessment  Pressure Injury 09/17/17 Stage III -  Full thickness tissue loss. Subcutaneous fat may be visible but bone, tendon or muscle are NOT exposed. Large green stage 3 to middle of buttocks with mulitple small stage 3 around it (approx. 5) (Active)  Dressing Type ABD;Gauze (Comment);Moist to dry 09/20/2017  9:51 AM  Dressing Changed;Clean;Dry;Intact 09/20/2017  9:51 AM  Dressing Change Frequency Daily 09/20/2017  9:51 AM  State of Healing Non-healing 09/20/2017  9:51 AM  Site / Wound Assessment Bleeding;Pink;Brown;Yellow 09/20/2017  9:51 AM  % Wound base Red or Granulating 25% 09/20/2017  9:51 AM  % Wound base Yellow/Fibrinous Exudate 50% 09/20/2017  9:51 AM  % Wound base Black/Eschar 25% 09/20/2017  9:51 AM  Peri-wound Assessment Bleeding 09/20/2017  9:51 AM  Wound Length (cm) 7 cm 09/20/2017  9:51 AM  Wound Width (cm) 10.5 cm 09/20/2017  9:51 AM  Wound Depth (cm) 3.9 cm 09/20/2017  9:51 AM  Wound Surface Area (cm^2) 73.5 cm^2 09/20/2017  9:51 AM  Wound Volume (cm^3) 286.65 cm^3 09/20/2017  9:51 AM  Undermining (cm) 3 cm from 10:00-12:00; 2.5 cm from 12:00-3:00 09/20/2017  9:51 AM  Margins Unattached edges (unapproximated) 09/20/2017  9:51 AM  Drainage Amount Moderate 09/20/2017  9:51 AM  Drainage Description Sanguineous 09/20/2017  9:51 AM  Treatment Hydrotherapy (Pulse lavage);Packing (Saline gauze) 09/20/2017  9:51 AM  Santyl applied to wound bed prior to applying dressing.   Hydrotherapy Pulsed lavage therapy - wound location: Sacrum Pulsed Lavage  with Suction (psi): 8 psi(4-8 psi with breaks for pain control) Pulsed Lavage with Suction - Normal Saline Used: 1000 mL Pulsed Lavage Tip: Tip with splash shield   Wound Assessment and Plan  Wound Therapy - Assess/Plan/Recommendations Wound Therapy - Clinical Statement: Pt presents to hydrotherapy s/p surgical I&D 09/19/17. Upon taking the old packing out, pt immediately began bleeding and continued heavily bleeding for several minutes. 6 silver nitrate sticks used to control bleeding and did not feel further debridement would be in the pt's best interest at this time. RN notified. Pt will benefit from continued hydrotherapy for selective removal of nonviable tissue and to decrease bioburden for wound bed healing.  Wound Therapy - Functional Problem List: Decreased tolerance for sitting up OOB  Factors Delaying/Impairing Wound Healing: Diabetes Mellitus;Immobility;Multiple medical problems Hydrotherapy Plan: Debridement;Dressing change;Patient/family education;Pulsatile lavage with suction Wound Therapy - Frequency: 6X / week Wound Therapy - Follow Up Recommendations: Skilled nursing facility Wound Plan: See above  Wound Therapy Goals- Improve the function of patient's integumentary system by progressing the wound(s) through the phases of wound healing (inflammation - proliferation - remodeling) by: Decrease Necrotic Tissue to: 25% Decrease Necrotic Tissue - Progress: Goal set today Increase Granulation Tissue to: 75% Increase Granulation Tissue - Progress: Goal set today Improve Drainage Characteristics: Min;Serous Improve Drainage Characteristics - Progress: Goal set today Goals/treatment plan/discharge plan were made with and agreed upon by patient/family: Yes Time For Goal Achievement: 7 days Wound Therapy - Potential for Goals: Good  Goals will be updated until maximal potential achieved or discharge criteria met.  Discharge criteria: when goals achieved, discharge from hospital, MD  decision/surgical intervention, no progress towards goals, refusal/missing three consecutive treatments without notification or medical reason.  GP     Thelma Comp 09/20/2017, 10:09 AM   Rolinda Roan, PT, DPT Acute Rehabilitation Services Pager: [phone]

## 2017-09-20 NOTE — Progress Notes (Signed)
Bear hugger d/c.

## 2017-09-20 NOTE — Progress Notes (Signed)
Pharmacy Antibiotic Note Natalie Goodman is a 84 y.o. female admitted on 09/17/2017 with large sacral decubitus ulcer that is s/p I&D on 3/8. Currently on day 3 of empiric Zosyn and vancomycin for coverage.  SCr continues to improve. CrCl ~ 25 ml/min  Plan: 1. Adjust vancomycin to 750 mg IV every 24 hours with next dose on 3/10 am  2. Adjust Zosyn to 3.375 grams IV every 8 hours (infused over 4 hours) 3. Follow up plan for abx   Height: 5\' 5"  (165.1 cm) Weight: 169 lb 12.1 oz (77 kg) IBW/kg (Calculated) : 57  Temp (24hrs), Avg:97.9 F (36.6 C), Min:94.6 F (34.8 C), Max:99.9 F (37.7 C)  Recent Labs  Lab 09/16/17  09/17/17 2051 09/17/17 2150 09/18/17 0127 09/18/17 0200 09/18/17 0416 09/19/17 0447 09/20/17 0504  WBC 14.6  --  22.5*  --   --  19.1*  --  18.0* 14.8*  CREATININE 1.5*   < > 2.84*  --  2.46* 2.49*  --  1.78* 1.64*  LATICACIDVEN  --   --   --  1.99* 1.9  --  1.3  --   --    < > = values in this interval not displayed.    Estimated Creatinine Clearance: 23.4 mL/min (A) (by C-G formula based on SCr of 1.64 mg/dL (H)).    Allergies  Allergen Reactions  . Codeine Nausea And Vomiting    Thank you for allowing pharmacy to be a part of this patient's care.  Pollyann SamplesAndy Kendrick Haapala, PharmD, BCPS 09/20/2017, 11:30 AM

## 2017-09-20 NOTE — Progress Notes (Signed)
In and out catheterization performed as ordered by MD for acute urinary retention. Pt attempted to urinated and attempt was unsuccessful.  In and out completed by Christean GriefIvana Carmela Piechowski at the bedside [address], witnessed by Willodean RosenthalKealin R, Charity fundraiserN. Peri care completed, bad pad and pt gown changed. RNs wearing gowns, sterile technique maintained throughout procedure. Pt tolerated procedure well, and 400 c of clear amber and odorless urine was collected.

## 2017-09-20 NOTE — Progress Notes (Signed)
PROGRESS NOTE  Natalie Goodman ZOX:[id number] DOB: 1925-06-01 DOA: 09/17/2017 PCP: Etta Grandchild, MD  Brief Narrative: 84 year old woman PMH diastolic CHF, dementia, recently hospitalized with discharge to short-term rehab where he was noted to have hypernatremia, acute kidney injury, oral candidiasis, attempt was made to treat with IV hydration but IV could not be established and therefore patient sent to ED.  There large sacral wound was noted as well as ulcer right lower leg.  Sacral wound was felt to be infected, patient was noted to have leukocytosis and subsequently was referred for admission for sepsis thought secondary to sacral wound.  Assessment/Plan  Sepsis with hypotension, modest hypothermia, leukocytosis, other findings, secondary to extensive sacral wound.   -improving -continue Iv Vanc/Zosyn -FU Blood Cx -continue D51/2NS cut down rate to 100cc/hr  Large sacral wound, presumably pressure injury --Surgical consulted, underwent bedside and then further debridement in OR today -very large wound --Empiric antibiotics --wound care  AKI superimposed on CKD stage III, likely secondary to failure to thrive, dehydration --No significant change in BUN and creatinine.  The pattern suggests prerenal etiology.  --continue IVF, monitor creatinine  Right lower extremity wound, 1+ DP pulse, chronicity unclear --suspect arterial insufficiency, chronic. No evidence of acute ischemia. --given current GOC and lack of any findings to suggest acute features will hold off on vascular consult  Hypernatremia, dehydration, failure to thrive --IVF as above  Chronic diastolic congestive heart failure --Monitor volume status closely. Appears dry.in length and  Acute metabolic encephalopathy --Treat sepsis, dehydration  Essential hypertension.  Currently borderline hypotensive.  Hold BP meds.  Dementia  ETHICS: Appears critically ill, prognosis guarded, may not survive. Dr.Goodrich  Discussed with HCPOA/sister Natalie Goodman by phone. She is realistic, understands pt may not survive. Pt has lost more than 100 pounds over time. Plan to treat as above; if fails, would consider comfort care.  PMT consulted  DVT prophylaxis: enoxaparin Code Status: DNR Family Communication:  Disposition Plan: to be determined   Zannie Cove, MD  [facility] Page via [email or web address]; password [email or web address]  7PM-7AM contact night coverage as above 09/20/2017, 11:55 AM  LOS: 3 days   Consultants:  General surgery  Vascular surgery  Procedures:    Antimicrobials:  Zosyn 3/6 >>   Vancomycin 3/6 >>   Interval history/Subjective: Back from OR, drowsy that  Objective: Vitals:  Vitals:   09/20/17 0841 09/20/17 0900  BP:    Pulse: 72 73  Resp: (!) 22 13  Temp: (!) 97.4 F (36.3 C)   SpO2: 97% 100%    Exam:  Gen: Frail, chronically ill elderly female laying in bed, no distress HEENT: PERRLA, Neck supple, no JVD Lungs: Decreased breath sounds at both bases, rest clear CVS: S1-S2/regular rate rhythm, faint systolic murmur Abd: soft, Non tender, non distended, BS present Extremities: Trace edema Skin: Large extensive sacral decubitus ulcer with dressing, ulcer/dressing over right leg  I have personally reviewed the following:   -labs reviewed Imaging studies:  Chest x-ray no acute disease  Medical tests:  EKG sinus rhythm   Scheduled Meds: . aspirin  300 mg Rectal Daily  . collagenase   Topical Daily  . enoxaparin (LOVENOX) injection  30 mg Subcutaneous Q24H  . feeding supplement (ENSURE ENLIVE)  237 mL Oral BID BM  . mouth rinse  15 mL Mouth Rinse BID   Continuous Infusions: . piperacillin-tazobactam (ZOSYN)  IV    . [START ON 09/21/2017] vancomycin      Principal Problem:   Sepsis (HCC) Active  Problems:   Essential hypertension   Coronary atherosclerosis   Chronic diastolic CHF (congestive heart failure) (HCC)   Hypernatremia   Acute metabolic  encephalopathy   Pressure ulcer   AKI (acute kidney injury) (HCC)   CKD (chronic kidney disease), stage III (HCC)   FTT (failure to thrive) in adult   Goals of care, counseling/discussion   Palliative care encounter   LOS: 3 days

## 2017-09-20 NOTE — Consult Note (Signed)
Consultation Note Date: 09/20/2017   Patient Name: Natalie Goodman  DOB: August 14, 1924  MRN: [id number]  Age / Sex: 84 y.o., female  PCP: Etta GrandchildJones, Thomas L, MD Referring Physician: Zannie CoveJoseph, Preetha, MD  Reason for Consultation: Establishing goals of care  HPI/Patient Profile: 84 y.o. female  with past medical history of CAD, CKD II, HTN, HLD, ?dementia, DM, diabetic foot ulcers, diastolic dysfunction,  who was recently hospitalized 08/29/17 to 09/03/17 with CHF, UTI, and foot wound. She was discharged to rehab and was readmitted on 09/17/2017 with AMS and found to have sepsis from thought secondary to wounds. Patient is s/p surgical debridement on 09/19/17. She has a large sacral wound extending to the bone. She has had ongoing pain and has been talking about being ready to die. Palliative care has been consulted to help clarify goals.   Clinical Assessment and Goals of Care: I called and spoke with patient's sister, Ms. Manson PasseyBrown. Prior to pt's hospitalization in 08/2017, she was living at home alone and was mostly independent with occasional assistance from her sister. Patient is widowed and had no children. I updated sister on pt's current medical status. She says she has been previously informed that patient is highly unlikely to have meaningful recovery from her wounds and decline. She says that patient has been talking for months about being ready to die. We talked about the option of comfort and hospice vs rehab. Ms. Manson PasseyBrown says she plans to come to the hospital this afternoon and I have asked the nurse to notify me when she arrives.   SUMMARY OF RECOMMENDATIONS   1. Agree with DNR 2. Liberalize morphine to treat pain 3. Family meeting later today  Discharge Planning: To Be Determined      Primary Diagnoses: Present on Admission: . Hypernatremia . Essential hypertension . Coronary atherosclerosis . Chronic diastolic  CHF (congestive heart failure) (HCC) . Acute metabolic encephalopathy . Sepsis (HCC) . (Resolved) Hypercalcemia . (Resolved) Acute renal failure superimposed on stage 3 chronic kidney disease (HCC) . Pressure ulcer   I have reviewed the medical record, interviewed the patient and family, and examined the patient. The following aspects are pertinent.  Past Medical History:  Diagnosis Date  . Anemia   . CAD (coronary artery disease)    a. per patient report, unable to recall any cardiac catheterizations being performed or stents placed  b. NST in 2013 showing inferior and lateral wall scar with mild ischemia --> treated medically  . ESOPHAGEAL STRICTURE   . GERD   . Hyperlipidemia   . Hypertension   . KIDNEY DISEASE, CHRONIC, STAGE I   . MYOCARDIAL INFARCTION, HX OF   . OSTEOARTHRITIS   . PARESTHESIA    Social History   Socioeconomic History  . Marital status: Widowed    Spouse name: None  . Number of children: None  . Years of education: None  . Highest education level: None  Social Needs  . Financial resource strain: None  . Food insecurity - worry: None  . Food insecurity -  inability: None  . Transportation needs - medical: None  . Transportation needs - non-medical: None  Occupational History  . None  Tobacco Use  . Smoking status: Never Smoker  . Smokeless tobacco: Never Used  Substance and Sexual Activity  . Alcohol use: No  . Drug use: No  . Sexual activity: Not Currently  Other Topics Concern  . None  Social History Narrative  . None   Family History  Problem Relation Age of Onset  . Hypertension Mother   . Heart disease Mother        CHF  . Hypertension Father   . Alcohol abuse Neg Hx   . Cancer Neg Hx   . Stroke Neg Hx    Scheduled Meds: . aspirin  300 mg Rectal Daily  . collagenase   Topical Daily  . enoxaparin (LOVENOX) injection  30 mg Subcutaneous Q24H  . feeding supplement (ENSURE ENLIVE)  237 mL Oral BID BM  . mouth rinse  15 mL Mouth  Rinse BID   Continuous Infusions: . dextrose 5 % and 0.45% NaCl 100 mL/hr at 09/20/17 0253  . piperacillin-tazobactam (ZOSYN)  IV Stopped (09/20/17 1027)  . vancomycin Stopped (09/20/17 0016)   PRN Meds:.acetaminophen **OR** acetaminophen, bisacodyl, fentaNYL (SUBLIMAZE) injection, magnesium hydroxide, morphine injection, ondansetron **OR** ondansetron (ZOFRAN) IV, ondansetron (ZOFRAN) IV, sodium phosphate Medications Prior to Admission:  Prior to Admission medications   Medication Sig Start Date End Date Taking? Authorizing Provider  aspirin 325 MG tablet Take 325 mg by mouth daily.   Yes [provider]  bisacodyl (DULCOLAX) 10 MG suppository Place 10 mg rectally as needed for moderate constipation.   Yes [provider]  magnesium hydroxide (MILK OF MAGNESIA) 400 MG/5ML suspension Take 30 mLs by mouth daily as needed for mild constipation.   Yes [provider]  NUTRITIONAL SUPPLEMENT LIQD Take 120 mLs by mouth daily. MedPass   Yes [provider]  sennosides-docusate sodium (SENOKOT-S) 8.6-50 MG tablet Take 2 tablets by mouth at bedtime.   Yes [provider]  Sodium Phosphates (RA SALINE ENEMA) 19-7 GM/118ML ENEM Place 1 each rectally as needed (for constipation).   Yes [provider]   Allergies  Allergen Reactions  . Codeine Nausea And Vomiting   Review of Systems  Unable to perform ROS   Physical Exam  Constitutional:  Frail appearing  Neck: Normal range of motion.  Cardiovascular: Normal rate, regular rhythm and normal heart sounds.  Pulmonary/Chest: Effort normal and breath sounds normal.  Abdominal: Soft.  Musculoskeletal: She exhibits edema.  Neurological: She is alert.  Skin:  Wound noted but not visualized    Vital Signs: BP (!) 115/48   Pulse 80   Temp 98 F (36.7 C) (Oral)   Resp 13   Ht 5\' 5"  (1.651 m)   Wt 77 kg (169 lb 12.1 oz)   SpO2 99%   BMI 28.25 kg/m  Pain Assessment: PAINAD   Pain Score:  Asleep   SpO2: SpO2: 99 % O2 Device:SpO2: 99 % O2 Flow Rate: .   IO: Intake/output summary:   Intake/Output Summary (Last 24 hours) at 09/20/2017 0754 Last data filed at 09/20/2017 0630 Gross per 24 hour  Intake 2812.5 ml  Output 1150 ml  Net 1662.5 ml    LBM:   Baseline Weight: Weight: 73.4 kg (161 lb 13.1 oz) Most recent weight: Weight: 77 kg (169 lb 12.1 oz)     Palliative Assessment/Data:   Flowsheet Rows     Most  Recent Value  Intake Tab  Referral Department  Hospitalist  Unit at Time of Referral  Med/Surg Unit  Palliative Care Primary Diagnosis  Sepsis/Infectious Disease  Date Notified  09/18/17  Palliative Care Type  New Palliative care  Reason for referral  Pain, Clarify Goals of Care  Date of Admission  09/18/17  Date first seen by Palliative Care  09/20/17  # of days Palliative referral response time  2 Day(s)  # of days IP prior to Palliative referral  0  Clinical Assessment  Palliative Performance Scale Score  20%  Pain Max last 24 hours  Not able to report  Pain Min Last 24 hours  Not able to report  Dyspnea Max Last 24 Hours  Not able to report  Dyspnea Min Last 24 hours  Not able to report  Nausea Max Last 24 Hours  Not able to report  Nausea Min Last 24 Hours  Not able to report  Anxiety Max Last 24 Hours  Not able to report  Anxiety Min Last 24 Hours  Not able to report  Other Max Last 24 Hours  Not able to report  Psychosocial & Spiritual Assessment  Palliative Care Outcomes      Time In: 0730 Time Out: 0830 Time Total: 30 minutes Greater than 50%  of this time was spent counseling and coordinating care related to the above assessment and plan.  Signed by: Malachy Moan, NP   Please contact Palliative Medicine Team phone at [phone] for questions and concerns.  For individual provider: See Loretha Stapler

## 2017-09-20 NOTE — Progress Notes (Signed)
Called to see pt for symptom mgt. Pt is alert, pleasantly confused, eating dinner. Multiple family members at the bedside. Pt c/o leg pain. She is comfortable at rest but with any movement, AdL's experiences terrible pain. Recommend pre-medicating for any turning, bathing, linen change, etc. Will also increase PRN range to MS04 1-2 q3 PRN. Nursing care order placed regarding premedication, as well as discussion with primary RN. Palliative Medicine to see pt on 09/21/17 to re-evaluate pain control. Anticipate pt needing scheduled MS04. Thank you, Eduard RouxSarah Laker Thompson, ANP-ACHPN

## 2017-09-20 NOTE — Progress Notes (Signed)
Pt container for urinary suction emptied at 2011 had 700 cc output. Overnight pt had 100 cc output, no bladder distention, does not c/o discomfort.  Bladder scan indicated 320 cc of urine in bladder. MD notified, order for In and Out placed.

## 2017-09-21 DIAGNOSIS — E43 Unspecified severe protein-calorie malnutrition: Secondary | ICD-10-CM

## 2017-09-21 LAB — MRSA PCR SCREENING: MRSA BY PCR: NEGATIVE

## 2017-09-21 NOTE — Progress Notes (Signed)
Daily Progress Note   Patient Name: Natalie Goodman       Date: 09/21/2017 DOB: 1925/04/01  Age: 84 y.o. MRN#: [id number] Attending Physician: Zannie CoveJoseph, Preetha, MD Primary Care Physician: Etta GrandchildJones, Thomas L, MD Admit Date: 09/17/2017  Reason for Consultation/Follow-up: Establishing goals of care  Subjective: 84 y.o. female  with past medical history of CAD, CKD II, HTN, HLD, ?dementia, DM, diabetic foot ulcers, diastolic dysfunction,  who was recently hospitalized 08/29/17 to 09/03/17 with CHF, UTI, and foot wound. She was discharged to rehab and was readmitted on 09/17/2017 with AMS and found to have sepsis from thought secondary to wounds. Patient is s/p surgical debridement on 09/19/17. She has a large sacral wound extending to the bone. Continues to have poor oral intake and pain control issues.   Length of Stay: 4  Current Medications: Scheduled Meds:  . aspirin  300 mg Rectal Daily  . collagenase   Topical Daily  . enoxaparin (LOVENOX) injection  30 mg Subcutaneous Q24H  . feeding supplement (ENSURE ENLIVE)  237 mL Oral BID BM  . mouth rinse  15 mL Mouth Rinse BID    Continuous Infusions: . piperacillin-tazobactam (ZOSYN)  IV 3.375 g (09/21/17 [id number])  . vancomycin 750 mg (09/21/17 [id number])    PRN Meds: acetaminophen **OR** acetaminophen, bisacodyl, magnesium hydroxide, morphine injection, ondansetron **OR** ondansetron (ZOFRAN) IV, ondansetron (ZOFRAN) IV, sodium phosphate  Physical Exam  Constitutional:  Frail appearing, NAD  Cardiovascular: Normal rate and normal heart sounds.  Pulmonary/Chest: Effort normal and breath sounds normal.  Abdominal: Soft.  Musculoskeletal: Normal range of motion. She exhibits edema.  Neurological: She is alert.  Wakes with stimuli, pleasantly confused    Skin:  Wound noted but not visualized   Nursing note and vitals reviewed.           Vital Signs: BP (!) 111/48   Pulse 78   Temp (!) 97.4 F (36.3 C) (Oral)   Resp 16   Ht 5\' 5"  (1.651 m)   Wt 77 kg (169 lb 12.1 oz)   SpO2 100%   BMI 28.25 kg/m  SpO2: SpO2: 100 % O2 Device: O2 Device: Room Air O2 Flow Rate:    Patient Active Problem List   Diagnosis Date Noted  . Goals of care, counseling/discussion   . Palliative care encounter   .  Pressure ulcer 09/18/2017  . AKI (acute kidney injury) (HCC) 09/18/2017  . CKD (chronic kidney disease), stage III (HCC) 09/18/2017  . FTT (failure to thrive) in adult 09/18/2017  . Acute metabolic encephalopathy 09/17/2017  . Sepsis (HCC) 09/17/2017  . Acute diastolic CHF (congestive heart failure) (HCC) 08/30/2017  . Acute lower UTI 08/30/2017  . Hypernatremia 08/30/2017  . Chronic diastolic CHF (congestive heart failure) (HCC) 08/26/2017  . TSH elevation 08/25/2017  . Hypokalemia 08/25/2017  . Primary osteoarthritis of both knees 10/21/2016  . Deficiency anemia 11/29/2015  . Routine general medical examination at a health care facility 03/27/2014  . Eczema, dyshidrotic 04/01/2013  . Obesity (BMI 35.0-39.9 without comorbidity) 03/10/2013  . ESOPHAGEAL STRICTURE 06/27/2010  . GERD 06/12/2010  . KIDNEY DISEASE, CHRONIC, STAGE I 03/15/2010  . Hyperlipidemia with target LDL less than 160 03/01/2010  . Essential hypertension 03/01/2010  . Coronary atherosclerosis 03/01/2010  . PARESTHESIA 03/01/2010    Palliative Care Assessment & Plan   Assessment: Comfortable appearing this morning. Morphine liberalized yesterday but patient has had minimal dosing. Oral intake remains poor. Patient eating only bites/sips.   I called and spoke with pt's sister. She says she has talked with family and her pastor and believe that patient would not want to continue receiving aggressive treatment and would instead just want to be made comfortable. She  says she recognizes that patient's wound would likely never heal and that patient has a poor chance at meaningful improvement. We talked about the option of inpatient hospice and she was receptive to this idea. Case discussed with attending. SW consulted to help coordinate hospice referral.   Recommendations/Plan:  Referral for inpatient hospice  Thank you for allowing the Palliative Medicine Team to assist in the care of this patient.   Time In: 0745 Time Out: 0800 Total Time 15 minutes Prolonged Time Billed  NO      Greater than 50%  of this time was spent counseling and coordinating care related to the above assessment and plan.  Malachy Moan, NP  Please contact Palliative Medicine Team phone at [phone] for questions and concerns.

## 2017-09-21 NOTE — Progress Notes (Signed)
PROGRESS NOTE  Zarian Facey ZOX:[id number] DOB: 1925/06/23 DOA: 09/17/2017 PCP: Etta Grandchild, MD  Brief Narrative: 84 year old woman PMH diastolic CHF, dementia, recently hospitalized with discharge to short-term rehab where he was noted to have hypernatremia, acute kidney injury, oral candidiasis, attempt was made to treat with IV hydration but IV could not be established and therefore patient sent to ED.  There large sacral wound was noted as well as ulcer right lower leg.  Sacral wound was felt to be infected, patient was noted to have leukocytosis and subsequently was referred for admission for sepsis thought secondary to sacral wound.  Assessment/Plan  Sepsis with hypotension, modest hypothermia, leukocytosis, other findings, secondary to extensive sacral wound.   -Sepsis physiology has resolved -Stop antibiotics and fluids -Status post family meeting today and plan for comfort measures and residential hospice  Large sacral wound, presumably pressure injury --Surgical consulted, underwent bedside and then further debridement in OR  -very large wound, very unlikely this wound will never heal --Empiric antibiotics stopped, see discussion above --wound care  AKI superimposed on CKD stage III, likely secondary to failure to thrive, dehydration --No significant change in BUN and creatinine.  -Start IV fluids  Right lower extremity wound, 1+ DP pulse, chronicity unclear --suspect arterial insufficiency, chronic. No evidence of acute ischemia. --given current GOC and lack of any findings to suggest acute features will hold off on vascular consult  Hypernatremia, dehydration, failure to thrive -Status post hydration for 48 hours  Chronic diastolic congestive heart failure -Clinically euvolemic  Acute metabolic encephalopathy --Treat sepsis, dehydration  Essential hypertension.  Currently borderline hypotensive.  Hold BP meds.  Dementia   ETHICS: 84 year old patient with  dementia, bedbound with massive deep and extensive decubitus wound with necrosis, severe malnutrition and minimal PO intake  -Status post family meeting today, decision made for comfort measures, residential hospice  DVT prophylaxis: enoxaparin Code Status: DNR Family Communication: Discussed with sister yesterday Disposition Plan: Residential hospice when bed available   Zannie Cove, MD  [facility] Page via [email or web address]; password [email or web address]  7PM-7AM contact night coverage as above 09/21/2017, 12:37 PM  LOS: 4 days   Consultants:  General surgery  Vascular surgery  Palliative medicine  Procedures:    Antimicrobials:  Zosyn 3/6 >>   Vancomycin 3/6 >>   Interval history/Subjective: -Denies any complaints, per staff minimal by mouth intake  Objective: Vitals:  Vitals:   09/21/17 0000 09/21/17 0415  BP: (!) 107/48 (!) 111/48  Pulse: 72 78  Resp: 16 16  Temp: (!) 97.5 F (36.4 C) (!) 97.4 F (36.3 C)  SpO2: 100% 100%    Exam:  Gen: Elderly frail, chronically ill female, laying in bed, pleasant, no distress HEENT: PERRLA, Neck supple, no JVD Lungs: Poor air movement, decreased breath sounds at the bases CVS: RRR,No Gallops,Rubs or new Murmurs Abd: soft, Non tender, non distended, BS present Extremities: Trace edema Skin: Chronic venous stasis in both lower extremity, ulcer at the heel of the right foot Large 10 x 14 cm deep sacral decubitus ulcer with dressing, I did not open the dressing today  I have personally reviewed the following:   -labs reviewed Imaging studies:  Chest x-ray no acute disease  Medical tests:  EKG sinus rhythm   Scheduled Meds: . aspirin  300 mg Rectal Daily  . collagenase   Topical Daily  . enoxaparin (LOVENOX) injection  30 mg Subcutaneous Q24H  . feeding supplement (ENSURE ENLIVE)  237 mL Oral BID BM  . mouth  rinse  15 mL Mouth Rinse BID   Continuous Infusions: . piperacillin-tazobactam (ZOSYN)  IV Stopped  (09/21/17 1212)  . vancomycin Stopped (09/21/17 1212)    Principal Problem:   Sepsis (HCC) Active Problems:   Essential hypertension   Coronary atherosclerosis   Chronic diastolic CHF (congestive heart failure) (HCC)   Hypernatremia   Acute metabolic encephalopathy   Pressure ulcer   AKI (acute kidney injury) (HCC)   CKD (chronic kidney disease), stage III (HCC)   FTT (failure to thrive) in adult   Goals of care, counseling/discussion   Palliative care encounter   Protein-calorie malnutrition, severe   LOS: 4 days

## 2017-09-22 ENCOUNTER — Telehealth: Payer: Self-pay | Admitting: *Deleted

## 2017-09-22 MED ORDER — GLYCOPYRROLATE 0.2 MG/ML IJ SOLN: 0.2000 mg | Freq: Four times a day (QID) | INTRAMUSCULAR | Status: DC | PRN

## 2017-09-22 MED ORDER — HALOPERIDOL LACTATE 5 MG/ML IJ SOLN: 2.0000 mg | Freq: Four times a day (QID) | INTRAMUSCULAR | Status: DC | PRN

## 2017-09-22 MED ORDER — MORPHINE SULFATE (CONCENTRATE) 10 MG /0.5 ML PO SOLN: 5.0000 mg | ORAL | 0 refills | Status: AC | PRN

## 2017-09-22 MED ORDER — MORPHINE SULFATE (PF) 2 MG/ML IV SOLN
2.0000 mg | INTRAVENOUS | Status: DC | PRN
  Administered 2017-09-22: 2 mg via INTRAVENOUS
  Filled 2017-09-22: qty 1

## 2017-09-22 MED ORDER — HYDROMORPHONE HCL 1 MG/ML IJ SOLN: 0.5000 mg | INTRAMUSCULAR | Status: DC | PRN

## 2017-09-22 MED ORDER — HYDROMORPHONE HCL 1 MG/ML IJ SOLN
0.5000 mg | Freq: Four times a day (QID) | INTRAMUSCULAR | Status: DC
  Administered 2017-09-22 – 2017-09-23 (×4): 0.5 mg via INTRAVENOUS
  Filled 2017-09-22 (×4): qty 1

## 2017-09-22 MED ORDER — LORAZEPAM 2 MG/ML IJ SOLN: 1.0000 mg | INTRAMUSCULAR | Status: DC | PRN

## 2017-09-22 MED ORDER — HYDROMORPHONE HCL 1 MG/ML IJ SOLN
0.5000 mg | INTRAMUSCULAR | Status: DC | PRN
  Administered 2017-09-23: 0.5 mg via INTRAVENOUS
  Filled 2017-09-22: qty 1

## 2017-09-22 NOTE — Discharge Summary (Addendum)
Physician Discharge Summary  Kaiana Digiandomenico ZOX:[id number] DOB: 1924/07/31 DOA: 09/17/2017  PCP: Etta Grandchild, MD  Admit date: 09/17/2017 Discharge date: 09/23/2017  Time spent: 45 minutes  Recommendations for Outpatient Follow-up:  [facility]  Discharge Diagnoses:  Principal Problem:   Sepsis (HCC)   Large Sacral decubitus wound   Essential hypertension   Coronary atherosclerosis   Chronic diastolic CHF (congestive heart failure) (HCC)   Hypernatremia   Acute metabolic encephalopathy   Pressure ulcer   AKI (acute kidney injury) (HCC)   CKD (chronic kidney disease), stage III (HCC)   FTT (failure to thrive) in adult   Goals of care, counseling/discussion   Palliative care encounter   Protein-calorie malnutrition, severe   Discharge Condition: poor  Diet recommendation: comfort feeds  Filed Weights   09/18/17 1542 09/19/17 0625 09/20/17 0500  Weight: 73.4 kg (161 lb 13.1 oz) 73.5 kg (162 lb) 77 kg (169 lb 12.1 oz)    History of present illness:  84 year old woman PMH diastolic CHF, dementia, recently hospitalized with discharge to short-term rehab where he was noted to have hypernatremia, acute kidney injury, oral candidiasis, attempt was made to treat with IV hydration but IV could not be established and therefore patient sent to ED.  There was a deep and extensive sacral wound was noted as well as ulcer right lower leg  Hospital Course:   Sepsis with hypotension, modest hypothermia, leukocytosis, other findings, secondary to extensive sacral wound.   -Sepsis physiology has resolved, treated with Abx initially and underwent debridement of massive decubitus wound -Stopped antibiotics and fluids now -Status post Palliative consult and plan for comfort measures, Hospice  Large sacral wound, presumably pressure injury --Surgical consulted, underwent bedside and then further debridement in OR on 3/8 -very large wound, very unlikely this wound  will ever heal --Empiric antibiotics stopped, see discussion above --continue wound care  AKI superimposed on CKD stage III, likely secondary to failure to thrive, dehydration --No significant change in BUN and creatinine.  - stopped IVF  Right lower extremity wound, 1+ DP pulse, chronicity unclear --suspect arterial insufficiency, chronic. No evidence of acute ischemia. - now comfort care  Hypernatremia, dehydration, failure to thrive -Status post hydration for 48 hours  Chronic diastolic congestive heart failure -Clinically euvolemic now after hydration  Acute metabolic encephalopathy --Treat sepsis, dehydration  Dementia -stable, oriented to self   ETHICS: 84 year old patient with dementia, bedbound with massive deep and extensive decubitus wound with necrosis, severe malnutrition, 100lb weight loss in 6months and poor PO intake -Status post Palliative care meeting with sister and POA Wynona Canes , decision made for comfort measures, Hospice  Code Status: DNR  Consultants:  General surgery  Palliative medicine  PROCEDURE:  Incisional debridement of sacral decubitus ulcer 3/8        Discharge Exam: Vitals:   09/22/17 0700 09/22/17 0857  BP:    Pulse: 66   Resp: 15   Temp:  (!) 97.5 F (36.4 C)  SpO2: 100%     General: AAOx1 Cardiovascular: S1S2/RRR tachycardic Respiratory: decreased at bases rest clear  Discharge Instructions   Discharge Instructions    Discharge instructions   Complete by:  As directed    Comfort feeds   Increase activity slowly   Complete by:  As directed      Allergies as of 09/22/2017      Reactions   Codeine Nausea And Vomiting      Medication List    STOP  taking these medications   magnesium hydroxide 400 MG/5ML suspension Commonly known as:  MILK OF MAGNESIA     TAKE these medications   aspirin 325 MG tablet Take 325 mg by mouth daily.   bisacodyl 10 MG suppository Commonly known as:   DULCOLAX Place 10 mg rectally as needed for moderate constipation.   morphine CONCENTRATE 10 mg / 0.5 ml concentrated solution Take 0.25 mLs (5 mg total) by mouth every 4 (four) hours as needed for severe pain.   NUTRITIONAL SUPPLEMENT Liqd Take 120 mLs by mouth daily. MedPass   RA SALINE ENEMA 19-7 GM/118ML Enem Place 1 each rectally as needed (for constipation).   sennosides-docusate sodium 8.6-50 MG tablet Commonly known as:  SENOKOT-S Take 2 tablets by mouth at bedtime.      Allergies  Allergen Reactions  . Codeine Nausea And Vomiting      The results of significant diagnostics from this hospitalization (including imaging, microbiology, ancillary and laboratory) are listed below for reference.    Significant Diagnostic Studies: Dg Chest 2 View  Result Date: 09/17/2017 CLINICAL DATA:  Chest pain EXAM: CHEST - 2 VIEW COMPARISON:  None. FINDINGS: The heart size and mediastinal contours are within normal limits. Both lungs are clear. The visualized skeletal structures are unremarkable. IMPRESSION: No active cardiopulmonary disease. Electronically Signed   By: Deatra Robinson M.D.   On: 09/17/2017 18:45   Dg Chest 2 View  Result Date: 08/29/2017 CLINICAL DATA:  Difficulty walking EXAM: CHEST  2 VIEW COMPARISON:  08/19/2017 FINDINGS: Cardiac shadow is enlarged. Aortic calcifications are again seen. The lungs are well aerated bilaterally. Small bilateral pleural effusions are noted. Chronic appearing changes in the left retrocardiac region are noted. IMPRESSION: Small bilateral pleural effusions. Electronically Signed   By: Alcide Clever M.D.   On: 08/29/2017 16:52    Microbiology: Recent Results (from the past 240 hour(s))  Culture, blood (Routine X 2) w Reflex to ID Panel     Status: None (Preliminary result)   Collection Time: 09/18/17  1:27 AM  Result Value Ref Range Status   Specimen Description BLOOD LEFT FOREARM  Final   Special Requests IN PEDIATRIC BOTTLE Blood Culture  adequate volume  Final   Culture   Final    NO GROWTH 3 DAYS Performed at [facility], [address] [place], [place] [address]    Report Status PENDING  Incomplete  Culture, blood (Routine X 2) w Reflex to ID Panel     Status: None (Preliminary result)   Collection Time: 09/18/17  2:00 AM  Result Value Ref Range Status   Specimen Description BLOOD RIGHT HAND  Final   Special Requests IN PEDIATRIC BOTTLE Blood Culture adequate volume  Final   Culture   Final    NO GROWTH 3 DAYS Performed at [facility], [address], [place], [place] [address]    Report Status PENDING  Incomplete  MRSA PCR Screening     Status: None   Collection Time: 09/21/17  3:06 AM  Result Value Ref Range Status   MRSA by PCR NEGATIVE NEGATIVE Final    Comment:        The GeneXpert MRSA Assay (FDA approved for NASAL specimens only), is one component of a comprehensive MRSA colonization surveillance program. It is not intended to diagnose MRSA infection nor to guide or monitor treatment for MRSA infections. Performed at [facility], [address], [place], [place] [address]  Labs: Basic Metabolic Panel: Recent Labs  Lab 09/17/17 2051 09/18/17 0127 09/18/17 0200 09/19/17 0447 09/20/17 0504  NA 152* 148* 147* 146* 144  K 4.2 3.8 4.1 3.5 3.3*  CL 110 114* 112* 114* 112*  CO2 23 21* 21* 20* 21*  GLUCOSE 113* 117* 106* 146* 106*  BUN 70* 66* 67* 43* 29*  CREATININE 2.84* 2.46* 2.49* 1.78* 1.64*  CALCIUM 10.8* 9.7 9.8 9.0 8.6*   Liver Function Tests: Recent Labs  Lab 09/17/17 2051  AST 33  ALT 21  ALKPHOS 107  BILITOT 1.4*  PROT 8.9*  ALBUMIN 2.7*   No results for input(s): LIPASE, AMYLASE in the last 168 hours. No results for input(s): AMMONIA in the last 168 hours. CBC: Recent Labs  Lab 09/16/17 09/17/17 2051 09/18/17 0200 09/19/17 0447 09/20/17 0504  WBC 14.6 22.5* 19.1* 18.0* 14.8*  NEUTROABS 12 19.0*  --   --   --   HGB 12.8 13.2 11.9* 10.6*  10.3*  HCT 41 40.4 36.8 34.1* 32.5*  MCV  --  96.4 95.6 98.8 96.4  PLT 321 343 286 264 249   Cardiac Enzymes: No results for input(s): CKTOTAL, CKMB, CKMBINDEX, TROPONINI in the last 168 hours. BNP: BNP (last 3 results) Recent Labs    08/29/17 1526 09/17/17 2349  BNP 287.8* 133.6*    ProBNP (last 3 results) No results for input(s): PROBNP in the last 8760 hours.  CBG: Recent Labs  Lab 09/17/17 2104 09/18/17 1516  GLUCAP 167* 113*       Signed:  Zannie Cove MD.  [facility] 09/22/2017, 1:07 PM

## 2017-09-22 NOTE — Progress Notes (Signed)
Checked patient wound. Wound with good granulation tissue and small amount fibrinous looking tissue at wound base, clean with minimal purulence. Continue daily dressing changes and efforts to relieve pressure from sacrum. Patient discharging to SNF with hospice today.   Natalie GuilesKelly Rayburn , [facility] 09/22/2017, 12:48 PM Pager: [phone] Consults: [phone] Mon-Fri 7:00 am-4:30 pm Sat-Sun 7:00 am-11:30 am

## 2017-09-22 NOTE — Progress Notes (Signed)
Nutrition Brief Note  Chart reviewed. Pt now transitioning to comfort care.  No further nutrition interventions warranted at this time.  Please re-consult as needed.   Itzamara Casas M. Lorea Kupfer, MS, RD LDN Inpatient Clinical Dietitian Pager [phone]    

## 2017-09-22 NOTE — Progress Notes (Signed)
Physical Therapy Hydrotherapy Discharge Patient Details Name: Natalie Goodman MRN: [id number] DOB: November 10, 1924 Today's Date: 09/22/2017 Time:  -     Patient discharged from PT  Hydrotherapy services secondary to Pt has been placed on comfort care.   If additional Hydrotherapy is needed please re-order.  Thank you, Courtney ParisElizabeth B. Beverely RisenVan Goodman PT, DPT Acute Rehabilitation  [phone] Pager [phone]      Natalie Goodman 09/22/2017, 8:24 AM

## 2017-09-22 NOTE — Telephone Encounter (Signed)
1. Pt was on TCM report admitted 09/17/17 for Sepsis. Pt just recently discharge to short-term rehab where he was noted to have hypernatremia, acute kidney injury, oral candidiasis, attempt was made to treat with IV hydration but IV could not be established and therefore patient sent back to ED. There was a deep and extensive sacral wound that was noted as well as ulcer right lower leg. Pt D/C 09/22/17, and sent to SNF with Hospice Care.Marland Kitchen.Raechel Chute/lmb      t

## 2017-09-22 NOTE — Clinical Social Work Note (Addendum)
CSW spoke with pt and pt's sister at bedside. Pt does not want to go back [facility]. CSW also explained how pt would be responsible for room and board at any facility when she goes with hospice following. Pt sister states she can stay with her over night but there will be no one there with her during the day. CSW received a call from [facility], they are going to look at the case--per Palliative note on 3/10 pt's sister was agreeable to inpatient hospice and it was deemed appropriate.    [place] City[place]ridget Yareth Goodman, [place] [phone]

## 2017-09-22 NOTE — Care Management Important Message (Signed)
Important Message  Patient Details  Name: Natalie Goodman MRN: [id number] Date of Birth: 1925/04/19   Medicare Important Message Given:  Yes    Dawn Convery 09/22/2017, 1:12 PM
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
## 2017-09-22 NOTE — Progress Notes (Signed)
Patient requested update from provider, RN and provider saw them bedside, patient and sister doesn't want [facility] to [facility], Art gallery managercalling social worker to inform.

## 2017-09-22 NOTE — Clinical Social Work Note (Signed)
Pt will go to [facility]ospice Home. Melanie with [facility]. CSW continuing to follow for transport needs at d/c.   [place]ridget Barret Esquivel, [place]WA [phone]

## 2017-09-22 NOTE — Consult Note (Signed)
[facility]  Received request from PMT RN Melanie. Chart reviewed and will follow up with patient and family tomorrow re availability.   Thank you,  Forrestine Himva Davis, LCSW [phone]

## 2017-09-23 ENCOUNTER — Other Ambulatory Visit: Payer: Self-pay

## 2017-09-23 LAB — CULTURE, BLOOD (ROUTINE X 2)
CULTURE: NO GROWTH
Culture: NO GROWTH
SPECIAL REQUESTS: ADEQUATE
Special Requests: ADEQUATE

## 2017-09-23 NOTE — Progress Notes (Signed)
[facility] able to offer a bed today pending paperwork completion. Multiple voicemails left for patient's sister to contact CSW.   Osborne Cascoadia Blade Scheff LCSW [phone]

## 2017-09-23 NOTE — Progress Notes (Signed)
Three attempts to [facility] [phone] no answer. Checked number with Child psychotherapistsocial worker. Will try again later.

## 2017-09-23 NOTE — Consult Note (Signed)
[facility]  [facility] room available for patient today. Sister completed paper work for transfer today. Dr. Barbee ShropshireHertweck to assume care per family request.   Please send discharge summary to [phone].  RN please call report to [phone].  Thank you,  Forrestine Himva Davis, LCSW [phone]

## 2017-09-23 NOTE — Progress Notes (Signed)
[facility] Anticipated DC date: 09/23/17 Family notified: Sister Transport by: Sharin MonsPTAR   Per MD [facility]. RN, patient, patient's family, and facility notified of DC. Discharge Summary sent to facility. RN given number for report [phone]). DC packet on chart. Ambulance transport requested for patient.   CSW signing off.  Cristobal GoldmannNadia Najae Rathert, LCSW Clinical Social Worker [phone]

## 2017-09-23 NOTE — Progress Notes (Signed)
Palliative Medicine RN Note: AM symptom check. No family at bedside.   Patient reports pain in relieved with scheduled IV dilaudid. She also reports she ate all her breakfast, but the tray is still in the room, and nothing has been eaten. She did drink some of the fluids.   I sat at bedside to talk to her and could smell her wounds. She is confused and unable to intelligibly follow conversation.   Plan is for inpatient hospice at [facility] today. I will follow up for symptoms again tomorrow if she has not left. PRN meds are in place in case of changes/decline.  Margret ChanceMelanie G. Maui Ahart, RN, BSN, [facility] Palliative Medicine Team 09/23/2017 9:32 AM Office [phone]

## 2017-09-23 NOTE — Progress Notes (Signed)
No changes, plan for Residential hospice today  Zannie CovePreetha Lisa-Marie Rueger, MD

## 2017-10-06 ENCOUNTER — Ambulatory Visit: Payer: Medicare HMO | Admitting: Internal Medicine

## 2017-10-13 DEATH — deceased

## 2018-09-07 IMAGING — CR DG CHEST 2V
2 series · 2 of 2 positions shown · non-contrast
Comparison: None.

CLINICAL DATA: Chest pain

EXAM:
CHEST - 2 VIEW

[chest lat]
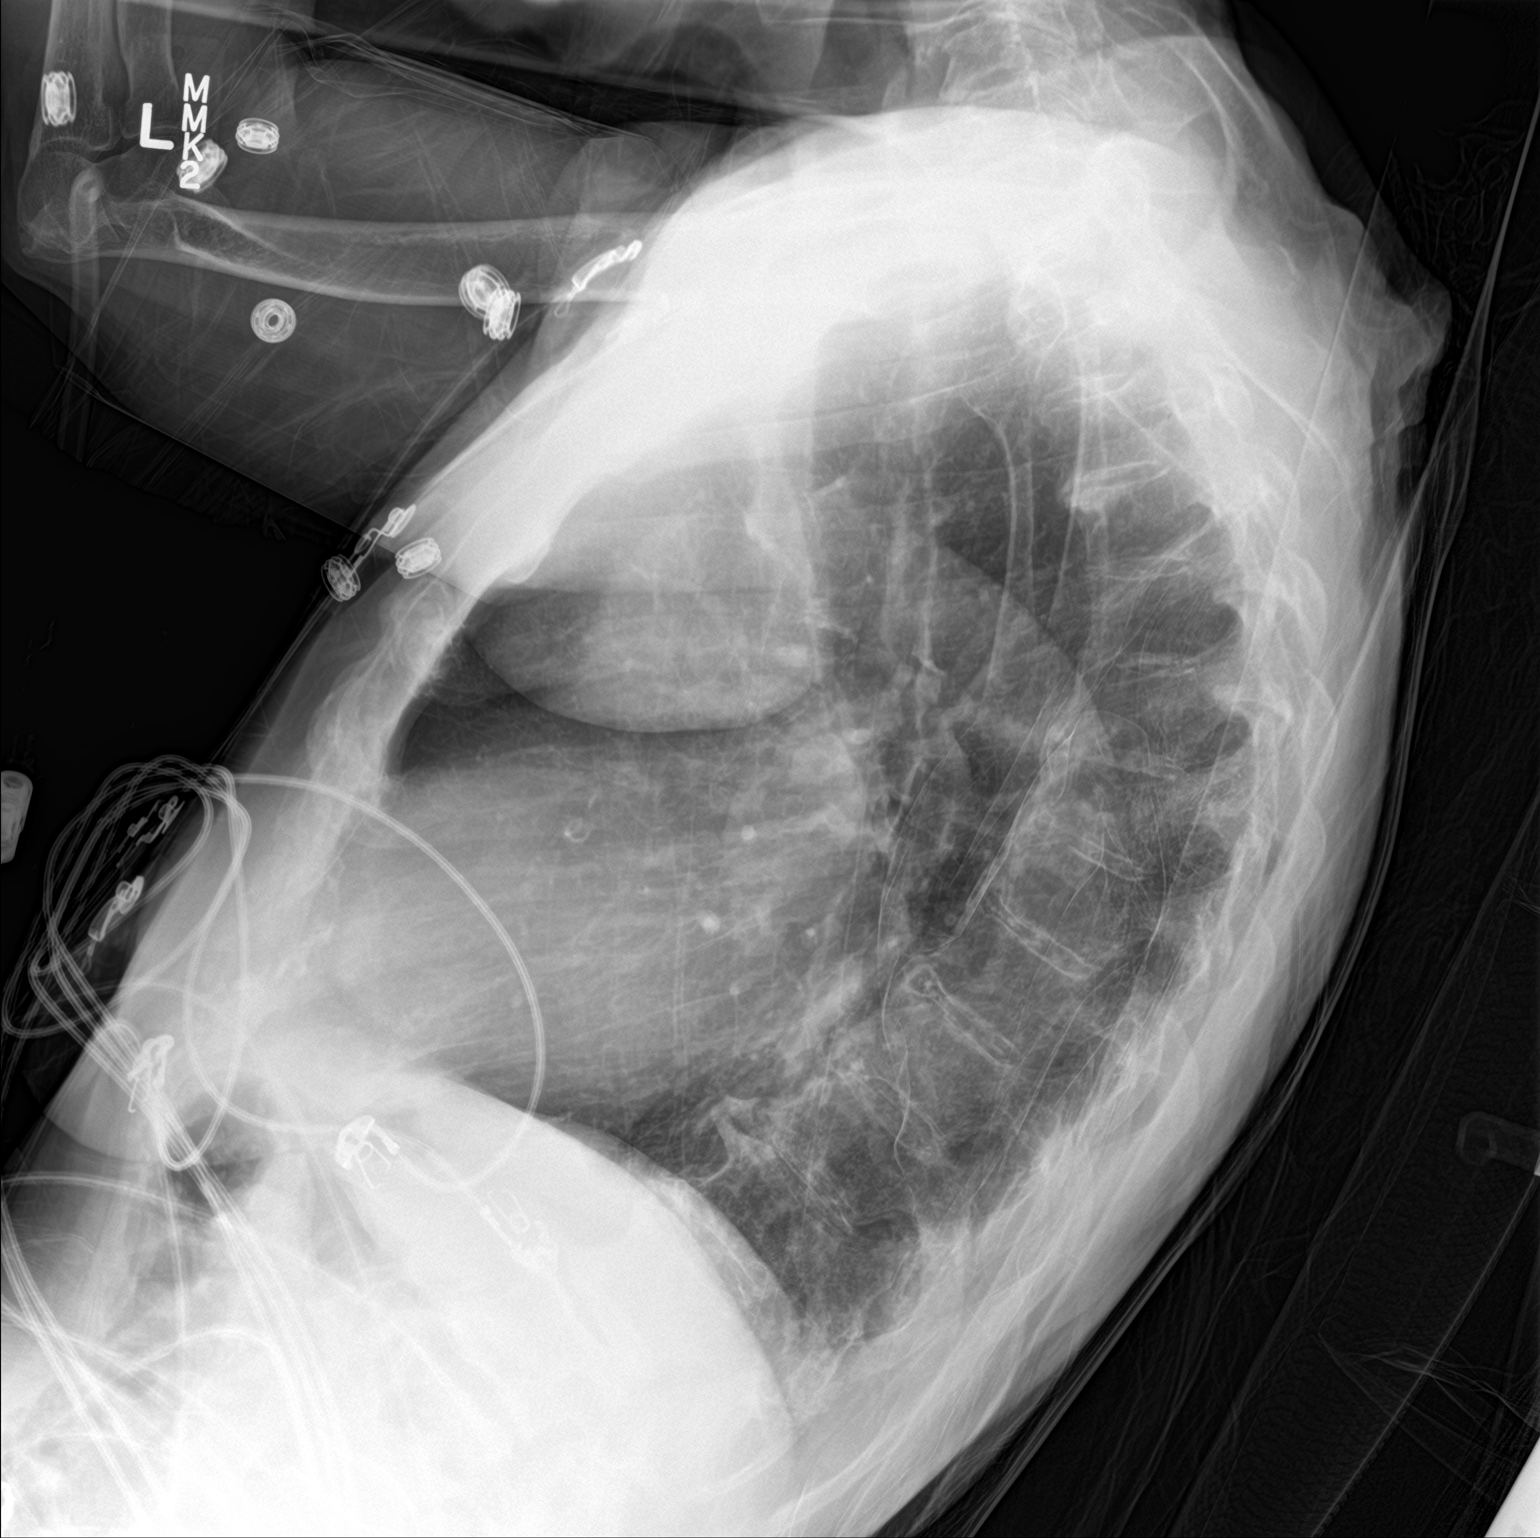

[chest ap]
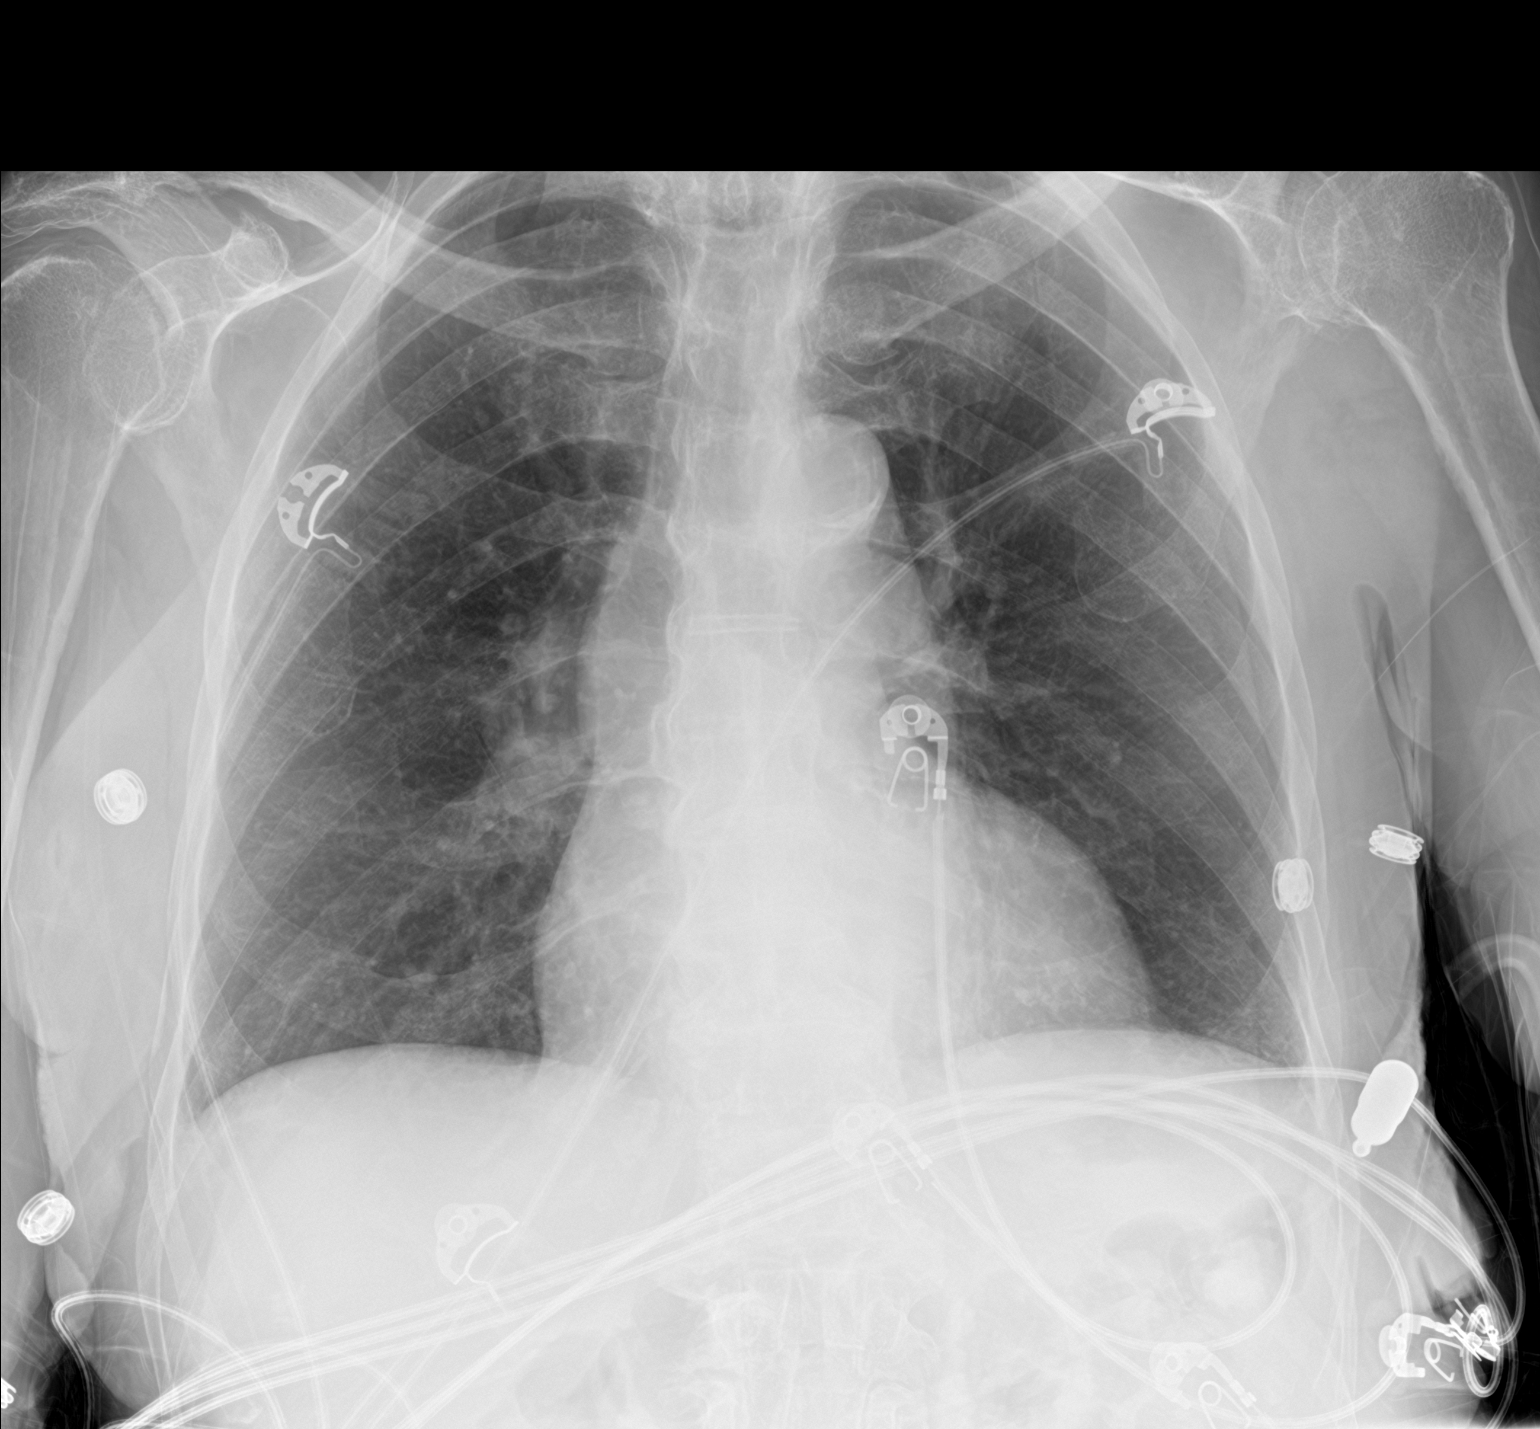

[2 of 2 positions shown; findings below may reference images not displayed]

FINDINGS: The heart size and mediastinal contours are within normal limits.
Both lungs are clear. The visualized skeletal structures are
unremarkable.
IMPRESSION: No active cardiopulmonary disease.
